# Patient Record
Sex: Female | Born: 1955 | Race: Black or African American | Hispanic: No | Marital: Single | Smoking: Never smoker
Health system: Southern US, Community
[De-identification: ages and names within clinical notes are randomized; demographics above are authoritative.]

## PROBLEM LIST (undated history)

## (undated) DIAGNOSIS — N183 Chronic kidney disease, stage 3 unspecified: Secondary | ICD-10-CM

## (undated) DIAGNOSIS — K769 Liver disease, unspecified: Secondary | ICD-10-CM

## (undated) DIAGNOSIS — E785 Hyperlipidemia, unspecified: Secondary | ICD-10-CM

## (undated) DIAGNOSIS — K859 Acute pancreatitis without necrosis or infection, unspecified: Secondary | ICD-10-CM

## (undated) DIAGNOSIS — E119 Type 2 diabetes mellitus without complications: Secondary | ICD-10-CM

## (undated) DIAGNOSIS — I10 Essential (primary) hypertension: Secondary | ICD-10-CM

## (undated) HISTORY — DX: Hyperlipidemia, unspecified: E78.5

## (undated) HISTORY — PX: PARTIAL HYSTERECTOMY: SHX80

## (undated) HISTORY — DX: Acute pancreatitis without necrosis or infection, unspecified: K85.90

## (undated) HISTORY — DX: Liver disease, unspecified: K76.9

## (undated) HISTORY — DX: Chronic kidney disease, stage 3 (moderate): N18.3

## (undated) HISTORY — DX: Chronic kidney disease, stage 3 unspecified: N18.30

## (undated) HISTORY — DX: Essential (primary) hypertension: I10

## (undated) HISTORY — DX: Type 2 diabetes mellitus without complications: E11.9

---

## 1999-05-05 ENCOUNTER — Emergency Department (HOSPITAL_COMMUNITY): Admission: EM | Admit: 1999-05-05 | Discharge: 1999-05-05 | Payer: Self-pay | Admitting: Emergency Medicine

## 1999-10-14 ENCOUNTER — Encounter: Payer: Self-pay | Admitting: Gastroenterology

## 1999-10-14 ENCOUNTER — Ambulatory Visit (HOSPITAL_COMMUNITY): Admission: RE | Admit: 1999-10-14 | Discharge: 1999-10-14 | Payer: Self-pay | Admitting: Gastroenterology

## 1999-10-31 ENCOUNTER — Other Ambulatory Visit: Admission: RE | Admit: 1999-10-31 | Discharge: 1999-10-31 | Payer: Self-pay | Admitting: Gastroenterology

## 2000-01-25 ENCOUNTER — Other Ambulatory Visit: Admission: RE | Admit: 2000-01-25 | Discharge: 2000-01-25 | Payer: Self-pay | Admitting: Obstetrics and Gynecology

## 2004-06-22 ENCOUNTER — Ambulatory Visit: Payer: Self-pay | Admitting: Internal Medicine

## 2004-07-19 ENCOUNTER — Other Ambulatory Visit: Admission: RE | Admit: 2004-07-19 | Discharge: 2004-07-19 | Payer: Self-pay | Admitting: Obstetrics and Gynecology

## 2005-09-12 ENCOUNTER — Encounter: Admission: RE | Admit: 2005-09-12 | Discharge: 2005-09-12 | Payer: Self-pay | Admitting: Family Medicine

## 2010-08-24 ENCOUNTER — Emergency Department (HOSPITAL_COMMUNITY)
Admission: EM | Admit: 2010-08-24 | Discharge: 2010-08-24 | Disposition: A | Discharge status: Home / Self Care | Payer: BC Managed Care – PPO | Attending: Emergency Medicine | Admitting: Emergency Medicine

## 2010-08-24 ENCOUNTER — Emergency Department (HOSPITAL_COMMUNITY): Payer: BC Managed Care – PPO

## 2010-08-24 DIAGNOSIS — T783XXA Angioneurotic edema, initial encounter: Secondary | ICD-10-CM | POA: Insufficient documentation

## 2010-08-24 DIAGNOSIS — R51 Headache: Secondary | ICD-10-CM | POA: Insufficient documentation

## 2010-08-24 DIAGNOSIS — I10 Essential (primary) hypertension: Secondary | ICD-10-CM | POA: Insufficient documentation

## 2010-08-24 DIAGNOSIS — X58XXXA Exposure to other specified factors, initial encounter: Secondary | ICD-10-CM | POA: Insufficient documentation

## 2010-08-24 DIAGNOSIS — D649 Anemia, unspecified: Secondary | ICD-10-CM | POA: Insufficient documentation

## 2010-08-24 LAB — CBC
MCH: 27.3 pg (ref 26.0–34.0)
Platelets: 402 10*3/uL — ABNORMAL HIGH (ref 150–400)
RBC: 3.96 MIL/uL (ref 3.87–5.11)
WBC: 10.3 10*3/uL (ref 4.0–10.5)

## 2010-08-24 LAB — DIFFERENTIAL
Basophils Absolute: 0 10*3/uL (ref 0.0–0.1)
Basophils Relative: 0 % (ref 0–1)
Eosinophils Absolute: 0 10*3/uL (ref 0.0–0.7)
Eosinophils Relative: 0 % (ref 0–5)
Lymphocytes Relative: 18 % (ref 12–46)
Lymphs Abs: 1.8 10*3/uL (ref 0.7–4.0)
Monocytes Absolute: 0.2 10*3/uL (ref 0.1–1.0)
Monocytes Relative: 2 % — ABNORMAL LOW (ref 3–12)
Neutro Abs: 8.2 10*3/uL — ABNORMAL HIGH (ref 1.7–7.7)
Neutrophils Relative %: 80 % — ABNORMAL HIGH (ref 43–77)

## 2010-08-24 LAB — BASIC METABOLIC PANEL
Chloride: 107 mEq/L (ref 96–112)
Creatinine, Ser: 1.37 mg/dL — ABNORMAL HIGH (ref 0.4–1.2)
GFR calc Af Amer: 48 mL/min — ABNORMAL LOW (ref >60)
Potassium: 3.9 mEq/L (ref 3.5–5.1)
Sodium: 140 mEq/L (ref 135–145)

## 2011-04-03 ENCOUNTER — Emergency Department (HOSPITAL_COMMUNITY)
Admission: EM | Admit: 2011-04-03 | Discharge: 2011-04-03 | Disposition: A | Discharge status: Home / Self Care | Payer: BC Managed Care – PPO | Attending: Emergency Medicine | Admitting: Emergency Medicine

## 2011-04-03 ENCOUNTER — Emergency Department (HOSPITAL_COMMUNITY): Payer: BC Managed Care – PPO

## 2011-04-03 DIAGNOSIS — I10 Essential (primary) hypertension: Secondary | ICD-10-CM | POA: Insufficient documentation

## 2011-04-03 DIAGNOSIS — E860 Dehydration: Secondary | ICD-10-CM | POA: Insufficient documentation

## 2011-04-03 DIAGNOSIS — R109 Unspecified abdominal pain: Secondary | ICD-10-CM | POA: Insufficient documentation

## 2011-04-03 DIAGNOSIS — R7309 Other abnormal glucose: Secondary | ICD-10-CM | POA: Insufficient documentation

## 2011-04-03 DIAGNOSIS — R197 Diarrhea, unspecified: Secondary | ICD-10-CM | POA: Insufficient documentation

## 2011-04-03 DIAGNOSIS — K7689 Other specified diseases of liver: Secondary | ICD-10-CM | POA: Insufficient documentation

## 2011-04-03 DIAGNOSIS — M069 Rheumatoid arthritis, unspecified: Secondary | ICD-10-CM | POA: Insufficient documentation

## 2011-04-03 LAB — DIFFERENTIAL
Basophils Relative: 0 % (ref 0–1)
Lymphocytes Relative: 24 % (ref 12–46)
Monocytes Relative: 6 % (ref 3–12)
Neutro Abs: 5.1 10*3/uL (ref 1.7–7.7)
Neutrophils Relative %: 69 % (ref 43–77)

## 2011-04-03 LAB — CBC
Hemoglobin: 11.7 g/dL — ABNORMAL LOW (ref 12.0–15.0)
MCH: 26.6 pg (ref 26.0–34.0)
RBC: 4.4 MIL/uL (ref 3.87–5.11)

## 2011-04-03 LAB — COMPREHENSIVE METABOLIC PANEL
ALT: 51 U/L — ABNORMAL HIGH (ref 0–35)
Alkaline Phosphatase: 120 U/L — ABNORMAL HIGH (ref 39–117)
CO2: 28 mEq/L (ref 19–32)
Calcium: 9.1 mg/dL (ref 8.4–10.5)
GFR calc Af Amer: >60 mL/min (ref >60)
GFR calc non Af Amer: 60 mL/min — ABNORMAL LOW (ref >60)
Glucose, Bld: 290 mg/dL — ABNORMAL HIGH (ref 70–99)
Potassium: 4 mEq/L (ref 3.5–5.1)
Sodium: 134 mEq/L — ABNORMAL LOW (ref 135–145)

## 2011-04-03 LAB — URINALYSIS, ROUTINE W REFLEX MICROSCOPIC
Bilirubin Urine: NEGATIVE
Ketones, ur: NEGATIVE mg/dL
Protein, ur: NEGATIVE mg/dL
Urobilinogen, UA: 0.2 mg/dL (ref 0.0–1.0)

## 2011-04-03 LAB — GLUCOSE, CAPILLARY

## 2011-04-03 MED ORDER — IOHEXOL 300 MG/ML  SOLN
80.0000 mL | Freq: Once | INTRAMUSCULAR | Status: AC | PRN
  Administered 2011-04-03: 80 mL via INTRAVENOUS

## 2014-01-22 ENCOUNTER — Encounter: Payer: Self-pay | Admitting: Internal Medicine

## 2014-03-26 ENCOUNTER — Other Ambulatory Visit: Payer: Self-pay

## 2014-03-31 ENCOUNTER — Ambulatory Visit: Payer: BC Managed Care – PPO | Admitting: Internal Medicine

## 2014-05-19 ENCOUNTER — Ambulatory Visit: Payer: BC Managed Care – PPO | Admitting: Internal Medicine

## 2014-06-19 ENCOUNTER — Ambulatory Visit: Payer: BC Managed Care – PPO | Admitting: Internal Medicine

## 2015-08-17 ENCOUNTER — Ambulatory Visit: Payer: BC Managed Care – PPO | Admitting: Dietician

## 2015-12-16 ENCOUNTER — Emergency Department (HOSPITAL_COMMUNITY)
Admission: EM | Admit: 2015-12-16 | Discharge: 2015-12-16 | Disposition: A | Discharge status: Home / Self Care | Payer: Worker's Compensation | Attending: Emergency Medicine | Admitting: Emergency Medicine

## 2015-12-16 ENCOUNTER — Emergency Department (HOSPITAL_COMMUNITY): Payer: Worker's Compensation

## 2015-12-16 ENCOUNTER — Encounter (HOSPITAL_COMMUNITY): Payer: Self-pay | Admitting: Nurse Practitioner

## 2015-12-16 DIAGNOSIS — W1830XA Fall on same level, unspecified, initial encounter: Secondary | ICD-10-CM | POA: Insufficient documentation

## 2015-12-16 DIAGNOSIS — Y939 Activity, unspecified: Secondary | ICD-10-CM | POA: Insufficient documentation

## 2015-12-16 DIAGNOSIS — E1122 Type 2 diabetes mellitus with diabetic chronic kidney disease: Secondary | ICD-10-CM | POA: Diagnosis not present

## 2015-12-16 DIAGNOSIS — S86002A Unspecified injury of left Achilles tendon, initial encounter: Secondary | ICD-10-CM | POA: Diagnosis not present

## 2015-12-16 DIAGNOSIS — Z79891 Long term (current) use of opiate analgesic: Secondary | ICD-10-CM | POA: Diagnosis not present

## 2015-12-16 DIAGNOSIS — I129 Hypertensive chronic kidney disease with stage 1 through stage 4 chronic kidney disease, or unspecified chronic kidney disease: Secondary | ICD-10-CM | POA: Diagnosis not present

## 2015-12-16 DIAGNOSIS — Z794 Long term (current) use of insulin: Secondary | ICD-10-CM | POA: Insufficient documentation

## 2015-12-16 DIAGNOSIS — Y999 Unspecified external cause status: Secondary | ICD-10-CM | POA: Diagnosis not present

## 2015-12-16 DIAGNOSIS — N183 Chronic kidney disease, stage 3 (moderate): Secondary | ICD-10-CM | POA: Diagnosis not present

## 2015-12-16 DIAGNOSIS — S99912A Unspecified injury of left ankle, initial encounter: Secondary | ICD-10-CM | POA: Diagnosis present

## 2015-12-16 DIAGNOSIS — Z79899 Other long term (current) drug therapy: Secondary | ICD-10-CM | POA: Diagnosis not present

## 2015-12-16 DIAGNOSIS — M25572 Pain in left ankle and joints of left foot: Secondary | ICD-10-CM

## 2015-12-16 DIAGNOSIS — Y929 Unspecified place or not applicable: Secondary | ICD-10-CM | POA: Diagnosis not present

## 2015-12-16 MED ORDER — OXYCODONE-ACETAMINOPHEN 5-325 MG PO TABS: 1.0000 | ORAL_TABLET | ORAL | Status: DC | PRN

## 2015-12-16 NOTE — ED Provider Notes (Signed)
CSN: [id number]     Arrival date & time 12/16/15  1501 History  By signing my name below, I, Soijett Blue, attest that this documentation has been prepared under the direction and in the presence of Sharilyn SitesLisa Juliya Magill, PA-C Electronically Signed: Soijett Blue, ED Scribe. 12/16/2015. 3:34 PM.   Chief Complaint  Patient presents with  . Ankle Pain    Left ankle      The history is provided by the patient. No language interpreter was used.    Jillian Montgomery is a 60 y.o. female with a medical hx of HTN, CKD, who presents to the Emergency Department complaining of left ankle pain onset PTA. Pt notes that she turned abruptly to ambulate and she fell onto her left side.  No head injury or LOC. Pt is having associated symptoms of throbbing left ankle pain, worse along posterior aspect. She notes that she has not tried any medications for the relief of her symptoms. No numbness/weakness of left leg.  Has not been able to ambulate since fall.  She denies color change, wound, rash, left foot swelling, and any other symptoms.   Past Medical History  Diagnosis Date  . Type II or unspecified type diabetes mellitus without mention of complication, not stated as uncontrolled   . Other and unspecified hyperlipidemia   . Unspecified essential hypertension   . Chronic kidney disease, stage III (moderate)    No past surgical history on file. No family history on file. Social History  Substance Use Topics  . Smoking status: Not on file  . Smokeless tobacco: Not on file  . Alcohol Use: Not on file   OB History    No data available     Review of Systems  Musculoskeletal: Positive for arthralgias. Negative for joint swelling.  Skin: Negative for color change, rash and wound.  All other systems reviewed and are negative.     Allergies  Review of patient's allergies indicates not on file.  Home Medications   Prior to Admission medications   Medication Sig Start Date End Date Taking? Authorizing  Provider  atenolol-chlorthalidone (TENORETIC) 50-25 MG per tablet 1 tablet    Historical Provider, MD  Empagliflozin (JARDIANCE) 25 MG TABS Take by mouth. 03/26/14   Rachael Feeaniel P Jacobs, MD  insulin detemir (LEVEMIR) 100 UNIT/ML injection Inject into the skin at bedtime. 03/26/14   Rachael Feeaniel P Jacobs, MD  traMADol (ULTRAM) 50 MG tablet Take 1 tablet (50 mg total) by mouth every 6 (six) hours as needed. 03/26/14   Rachael Feeaniel P Jacobs, MD   BP 124/72 mmHg  Pulse 71  Temp(Src) 98.3 F (36.8 C) (Oral)  Resp 18  SpO2 100%   Physical Exam  Constitutional: She is oriented to person, place, and time. She appears well-developed and well-nourished.  HENT:  Head: Normocephalic and atraumatic.  Mouth/Throat: Oropharynx is clear and moist.  Eyes: Conjunctivae and EOM are normal. Pupils are equal, round, and reactive to light.  Neck: Normal range of motion.  Cardiovascular: Normal rate, regular rhythm and normal heart sounds.   Pulmonary/Chest: Effort normal and breath sounds normal.  Abdominal: Soft. Bowel sounds are normal.  Musculoskeletal: Normal range of motion.       Left ankle: She exhibits swelling. Achilles tendon exhibits pain, defect and abnormal Thompson's test results.  Left ankle with severe tenderness and mild swelling over the achilles tendon, defect and laxity noted in central portion of tendon; pain with flexion of ankle, no pain with extension; + Thompson's test; DP  pulse intact; moving all toes appropriately  Neurological: She is alert and oriented to person, place, and time.  Skin: Skin is warm and dry.  Psychiatric: She has a normal mood and affect.  Nursing note and vitals reviewed.   ED Course  .Splint Application Date/Time: 12/16/2015 4:05 PM Performed by: Lou Cal Authorized by: Garlon Hatchet Consent: Verbal consent obtained. Risks and benefits: risks, benefits and alternatives were discussed Consent given by: patient Patient understanding: patient states understanding of  the procedure being performed Required items: required blood products, implants, devices, and special equipment available Patient identity confirmed: verbally with patient Location details: left ankle Splint type: short leg Supplies used: cotton padding,  elastic bandage and Ortho-Glass Post-procedure: The splinted body part was neurovascularly unchanged following the procedure. Patient tolerance: Patient tolerated the procedure well with no immediate complications Comments: Posterior splint in equinas position.   (including critical care time) DIAGNOSTIC STUDIES: Oxygen Saturation is 100% on RA, nl by my interpretation.    COORDINATION OF CARE: 3:23 PM Discussed treatment plan with pt at bedside which includes left ankle xray and pt agreed to plan.    Labs Review Labs Reviewed - No data to display  Imaging Review Dg Ankle Complete Left  12/16/2015  CLINICAL DATA:  Acute left ankle pain after twisting injury. Initial encounter. EXAM: LEFT ANKLE COMPLETE - 3+ VIEW COMPARISON:  None. FINDINGS: There is no evidence of fracture, dislocation, or joint effusion. There is no evidence of arthropathy or other focal bone abnormality. Soft tissues are unremarkable. IMPRESSION: Normal left ankle. Electronically Signed   By: Lupita Raider, M.D.   On: 12/16/2015 15:48   I have personally reviewed and evaluated these images as part of my medical decision-making.   EKG Interpretation None      MDM   Final diagnoses:  Left ankle pain  Achilles tendon injury, left, initial encounter   59 year old female here with posterior left ankle pain after a fall. No head injury loss of consciousness.  Patient has tenderness, swelling, and a defect noted along her Achilles tendon. She has a positive Thompson's test. Foot is neurovascularly intact. X-rays negative for acute bony findings. Strong suspicion for Achilles tendon injury/rupture. Patient to be placed in short-leg splint in equinas position.   Orthopedic follow-up given-- advised to call office today to scheduled FU appt.  Rx percocet for pain.  Discussed plan with patient, he/she acknowledged understanding and agreed with plan of care.  Return precautions given for new or worsening symptoms.  I personally performed the services described in this documentation, which was scribed in my presence. The recorded information has been reviewed and is accurate.   Garlon Hatchet, PA-C 12/16/15 1619  Lavera Guise, MD 12/18/15 [phone]

## 2015-12-16 NOTE — ED Notes (Signed)
Pt presents with severe pain 8/10 to her left ankle, reportedly had a "jerking" sudden movement that led hyperflexing of her ankle. No circulatory compromise noted on, mild swelling, pt unable to bare weight on affected leg.

## 2015-12-16 NOTE — Discharge Instructions (Signed)
Take the prescribed medication as directed.  Use caution, can make you sleepy/drowsy. Follow-up with orthopedics.  Call Dr. Nolon Nationsalldorf's office to make appt. Return to the ED for new or worsening symptoms.

## 2016-09-21 ENCOUNTER — Other Ambulatory Visit: Payer: Self-pay | Admitting: Nurse Practitioner

## 2016-09-21 DIAGNOSIS — Z1231 Encounter for screening mammogram for malignant neoplasm of breast: Secondary | ICD-10-CM

## 2016-10-09 ENCOUNTER — Ambulatory Visit
Admission: RE | Admit: 2016-10-09 | Discharge: 2016-10-09 | Disposition: A | Discharge status: Home / Self Care | Payer: BC Managed Care – PPO | Source: Ambulatory Visit | Attending: Nurse Practitioner | Admitting: Nurse Practitioner

## 2016-10-09 DIAGNOSIS — Z1231 Encounter for screening mammogram for malignant neoplasm of breast: Secondary | ICD-10-CM

## 2017-11-13 ENCOUNTER — Encounter: Payer: Self-pay | Admitting: Podiatry

## 2017-11-13 ENCOUNTER — Ambulatory Visit: Payer: BC Managed Care – PPO | Admitting: Podiatry

## 2017-11-13 VITALS — BP 149/71 | HR 73

## 2017-11-13 DIAGNOSIS — B070 Plantar wart: Secondary | ICD-10-CM

## 2017-11-13 MED ORDER — FLUOROURACIL 5 % EX CREA: TOPICAL_CREAM | Freq: Two times a day (BID) | CUTANEOUS | 1 refills | Status: DC

## 2017-11-13 NOTE — Progress Notes (Signed)
Subjective:  Patient ID: Jillian Montgomery, female    DOB: [birth year],  MRN: [id number] HPI Chief Complaint  Patient presents with  . Plantar Warts    right foot arch area; pt stated, "spot on foot started out like a little knot, became a little uncomfortable when walking; started forming about 1 yr ago"    62 y.o. female presents with the above complaint.   Review of systems: Denies fever chills nausea vomiting muscle aches and pains calf pain back pain chest pain shortness of breath and headache.  Past Medical History:  Diagnosis Date  . Chronic kidney disease, stage III (moderate) (HCC)   . Other and unspecified hyperlipidemia   . Type II or unspecified type diabetes mellitus without mention of complication, not stated as uncontrolled   . Unspecified essential hypertension    History reviewed. No pertinent surgical history.  Current Outpatient Medications:  .  atenolol-chlorthalidone (TENORETIC) 50-25 MG per tablet, 1 tablet, Disp: , Rfl:  .  atorvastatin (LIPITOR) 20 MG tablet, Take 20 mg by mouth daily., Disp: , Rfl: 0 .  Empagliflozin (JARDIANCE) 25 MG TABS, Take by mouth., Disp: 30 tablet, Rfl:  .  insulin detemir (LEVEMIR) 100 UNIT/ML injection, Inject into the skin at bedtime., Disp: 10 mL, Rfl: 11 .  metroNIDAZOLE (FLAGYL) 250 MG tablet, TAKE 1 TABLET 3 TIMES A DAY UNTIL ALL ARE TAKEN, Disp: , Rfl: 0 .  NOVOLOG FLEXPEN 100 UNIT/ML FlexPen, INJECT 5-10 UNITS THREE TIMES A DAY SUBCUTANEOUS 30 DAYS, Disp: , Rfl: 1 .  fluorouracil (EFUDEX) 5 % cream, Apply topically 2 (two) times daily., Disp: 40 g, Rfl: 1 .  oxyCODONE-acetaminophen (PERCOCET/ROXICET) 5-325 MG tablet, Take 1 tablet by mouth every 4 (four) hours as needed. (Patient not taking: Reported on 11/13/2017), Disp: 20 tablet, Rfl: 0 .  traMADol (ULTRAM) 50 MG tablet, Take 1 tablet (50 mg total) by mouth every 6 (six) hours as needed., Disp: 30 tablet, Rfl: 0  Allergies  Allergen Reactions  . Lisinopril Swelling    Pt  stated, "lips swell up"   Review of Systems Objective:   Vitals:   11/13/17 0851  BP: (!) 149/71  Pulse: 73    General: Well developed, nourished, in no acute distress, alert and oriented x3   Dermatological: Skin is warm, dry and supple bilateral. Nails x 10 are well maintained; remaining integument appears unremarkable at this time. There are no open sores, no preulcerative lesions, no rash or signs of infection present.  Lesion centrally located medial longitudinal arch along the medial band of the plantar fascia appears to be an intradermal lesion rather than a fibroma.  Possibly a dermatofibroma secondary to trauma or possibly even wart there is a small area on the lesion that appears to be a new verruca with thrombosed capillaries and second lines of discharge from that the lesion.  Vascular: Dorsalis Pedis artery and Posterior Tibial artery pedal pulses are 2/4 bilateral with immedate capillary fill time. Pedal hair growth present. No varicosities and no lower extremity edema present bilateral.   Neruologic: Grossly intact via light touch bilateral. Vibratory intact via tuning fork bilateral. Protective threshold with Semmes Wienstein monofilament intact to all pedal sites bilateral. Patellar and Achilles deep tendon reflexes 2+ bilateral. No Babinski or clonus noted bilateral.   Musculoskeletal: No gross boney pedal deformities bilateral. No pain, crepitus, or limitation noted with foot and ankle range of motion bilateral. Muscular strength 5/5 in all groups tested bilateral.  Gait: Unassisted, Nonantalgic.  Radiographs:  None taken  Assessment & Plan:   Assessment: Probable wart or dermatofibroma.  Plan: Placed Cantharone under occlusion today after debridement of the lesion.     Rhylee Nunn T. Chillum, [place]

## 2017-12-25 ENCOUNTER — Ambulatory Visit: Payer: BC Managed Care – PPO | Admitting: Podiatry

## 2020-10-07 DIAGNOSIS — I10 Essential (primary) hypertension: Secondary | ICD-10-CM | POA: Diagnosis not present

## 2020-10-07 DIAGNOSIS — N183 Chronic kidney disease, stage 3 unspecified: Secondary | ICD-10-CM | POA: Diagnosis not present

## 2020-10-07 DIAGNOSIS — Z1211 Encounter for screening for malignant neoplasm of colon: Secondary | ICD-10-CM | POA: Diagnosis not present

## 2020-10-07 DIAGNOSIS — E1165 Type 2 diabetes mellitus with hyperglycemia: Secondary | ICD-10-CM | POA: Diagnosis not present

## 2020-10-07 DIAGNOSIS — Z23 Encounter for immunization: Secondary | ICD-10-CM | POA: Diagnosis not present

## 2020-10-07 DIAGNOSIS — E785 Hyperlipidemia, unspecified: Secondary | ICD-10-CM | POA: Diagnosis not present

## 2020-10-07 DIAGNOSIS — Z0000 Encounter for general adult medical examination without abnormal findings: Secondary | ICD-10-CM | POA: Diagnosis not present

## 2020-10-07 DIAGNOSIS — E2839 Other primary ovarian failure: Secondary | ICD-10-CM | POA: Diagnosis not present

## 2020-10-07 DIAGNOSIS — Z1389 Encounter for screening for other disorder: Secondary | ICD-10-CM | POA: Diagnosis not present

## 2020-10-08 ENCOUNTER — Other Ambulatory Visit: Payer: Self-pay | Admitting: Family Medicine

## 2020-10-08 DIAGNOSIS — Z1231 Encounter for screening mammogram for malignant neoplasm of breast: Secondary | ICD-10-CM

## 2020-10-08 DIAGNOSIS — E2839 Other primary ovarian failure: Secondary | ICD-10-CM

## 2021-02-04 DIAGNOSIS — E1169 Type 2 diabetes mellitus with other specified complication: Secondary | ICD-10-CM | POA: Diagnosis not present

## 2021-02-04 DIAGNOSIS — Z794 Long term (current) use of insulin: Secondary | ICD-10-CM | POA: Diagnosis not present

## 2021-02-04 DIAGNOSIS — E785 Hyperlipidemia, unspecified: Secondary | ICD-10-CM | POA: Diagnosis not present

## 2021-02-04 DIAGNOSIS — I10 Essential (primary) hypertension: Secondary | ICD-10-CM | POA: Diagnosis not present

## 2021-02-04 DIAGNOSIS — E1129 Type 2 diabetes mellitus with other diabetic kidney complication: Secondary | ICD-10-CM | POA: Diagnosis not present

## 2021-02-04 DIAGNOSIS — E1165 Type 2 diabetes mellitus with hyperglycemia: Secondary | ICD-10-CM | POA: Diagnosis not present

## 2021-02-04 DIAGNOSIS — R809 Proteinuria, unspecified: Secondary | ICD-10-CM | POA: Diagnosis not present

## 2021-02-13 DIAGNOSIS — E119 Type 2 diabetes mellitus without complications: Secondary | ICD-10-CM

## 2021-02-13 DIAGNOSIS — E1165 Type 2 diabetes mellitus with hyperglycemia: Secondary | ICD-10-CM | POA: Diagnosis present

## 2021-04-13 DIAGNOSIS — H5213 Myopia, bilateral: Secondary | ICD-10-CM | POA: Diagnosis not present

## 2021-04-14 ENCOUNTER — Ambulatory Visit
Admission: RE | Admit: 2021-04-14 | Discharge: 2021-04-14 | Disposition: A | Discharge status: Home / Self Care | Payer: Medicare HMO | Source: Ambulatory Visit | Attending: Family Medicine | Admitting: Family Medicine

## 2021-04-14 ENCOUNTER — Other Ambulatory Visit: Payer: Self-pay

## 2021-04-14 DIAGNOSIS — Z1231 Encounter for screening mammogram for malignant neoplasm of breast: Secondary | ICD-10-CM

## 2021-04-14 DIAGNOSIS — E2839 Other primary ovarian failure: Secondary | ICD-10-CM

## 2021-04-14 DIAGNOSIS — M810 Age-related osteoporosis without current pathological fracture: Secondary | ICD-10-CM | POA: Diagnosis not present

## 2021-04-14 DIAGNOSIS — Z780 Asymptomatic menopausal state: Secondary | ICD-10-CM | POA: Diagnosis not present

## 2021-04-14 DIAGNOSIS — M85851 Other specified disorders of bone density and structure, right thigh: Secondary | ICD-10-CM | POA: Diagnosis not present

## 2021-08-09 DIAGNOSIS — H524 Presbyopia: Secondary | ICD-10-CM | POA: Diagnosis not present

## 2021-08-09 DIAGNOSIS — H52222 Regular astigmatism, left eye: Secondary | ICD-10-CM | POA: Diagnosis not present

## 2021-08-31 DIAGNOSIS — E785 Hyperlipidemia, unspecified: Secondary | ICD-10-CM | POA: Diagnosis not present

## 2021-08-31 DIAGNOSIS — N183 Chronic kidney disease, stage 3 unspecified: Secondary | ICD-10-CM | POA: Diagnosis not present

## 2021-08-31 DIAGNOSIS — I10 Essential (primary) hypertension: Secondary | ICD-10-CM | POA: Diagnosis not present

## 2021-08-31 DIAGNOSIS — E1165 Type 2 diabetes mellitus with hyperglycemia: Secondary | ICD-10-CM | POA: Diagnosis not present

## 2021-08-31 DIAGNOSIS — M542 Cervicalgia: Secondary | ICD-10-CM | POA: Diagnosis not present

## 2021-09-13 DIAGNOSIS — M2560 Stiffness of unspecified joint, not elsewhere classified: Secondary | ICD-10-CM | POA: Diagnosis not present

## 2021-09-13 DIAGNOSIS — M542 Cervicalgia: Secondary | ICD-10-CM | POA: Diagnosis not present

## 2021-09-29 DIAGNOSIS — M2560 Stiffness of unspecified joint, not elsewhere classified: Secondary | ICD-10-CM | POA: Diagnosis not present

## 2021-09-29 DIAGNOSIS — M542 Cervicalgia: Secondary | ICD-10-CM | POA: Diagnosis not present

## 2021-10-11 DIAGNOSIS — M2560 Stiffness of unspecified joint, not elsewhere classified: Secondary | ICD-10-CM | POA: Diagnosis not present

## 2021-10-11 DIAGNOSIS — M542 Cervicalgia: Secondary | ICD-10-CM | POA: Diagnosis not present

## 2021-10-13 DIAGNOSIS — M2560 Stiffness of unspecified joint, not elsewhere classified: Secondary | ICD-10-CM | POA: Diagnosis not present

## 2021-10-13 DIAGNOSIS — M542 Cervicalgia: Secondary | ICD-10-CM | POA: Diagnosis not present

## 2021-10-17 DIAGNOSIS — M542 Cervicalgia: Secondary | ICD-10-CM | POA: Diagnosis not present

## 2021-10-17 DIAGNOSIS — M2560 Stiffness of unspecified joint, not elsewhere classified: Secondary | ICD-10-CM | POA: Diagnosis not present

## 2021-10-24 DIAGNOSIS — M2560 Stiffness of unspecified joint, not elsewhere classified: Secondary | ICD-10-CM | POA: Diagnosis not present

## 2021-10-24 DIAGNOSIS — M542 Cervicalgia: Secondary | ICD-10-CM | POA: Diagnosis not present

## 2022-03-28 DIAGNOSIS — I10 Essential (primary) hypertension: Secondary | ICD-10-CM | POA: Diagnosis not present

## 2022-03-28 DIAGNOSIS — E785 Hyperlipidemia, unspecified: Secondary | ICD-10-CM | POA: Diagnosis not present

## 2022-03-28 DIAGNOSIS — F439 Reaction to severe stress, unspecified: Secondary | ICD-10-CM | POA: Diagnosis not present

## 2022-03-28 DIAGNOSIS — E1165 Type 2 diabetes mellitus with hyperglycemia: Secondary | ICD-10-CM | POA: Diagnosis not present

## 2022-03-28 DIAGNOSIS — R808 Other proteinuria: Secondary | ICD-10-CM | POA: Diagnosis not present

## 2022-03-28 DIAGNOSIS — N183 Chronic kidney disease, stage 3 unspecified: Secondary | ICD-10-CM | POA: Diagnosis not present

## 2022-04-14 DIAGNOSIS — H5203 Hypermetropia, bilateral: Secondary | ICD-10-CM | POA: Diagnosis not present

## 2022-04-17 DIAGNOSIS — I10 Essential (primary) hypertension: Secondary | ICD-10-CM | POA: Diagnosis not present

## 2022-04-17 DIAGNOSIS — E1129 Type 2 diabetes mellitus with other diabetic kidney complication: Secondary | ICD-10-CM | POA: Diagnosis not present

## 2022-04-17 DIAGNOSIS — E1165 Type 2 diabetes mellitus with hyperglycemia: Secondary | ICD-10-CM | POA: Diagnosis not present

## 2022-04-17 DIAGNOSIS — N183 Chronic kidney disease, stage 3 unspecified: Secondary | ICD-10-CM | POA: Diagnosis not present

## 2022-04-17 DIAGNOSIS — R809 Proteinuria, unspecified: Secondary | ICD-10-CM | POA: Diagnosis not present

## 2022-05-27 IMAGING — MG MM DIGITAL SCREENING BILAT W/ TOMO AND CAD
8 series · 8 of 24 positions shown · non-contrast
Comparison: Previous exam(s).

CLINICAL DATA: Screening.

EXAM:
DIGITAL SCREENING BILATERAL MAMMOGRAM WITH TOMOSYNTHESIS AND CAD
TECHNIQUE: Bilateral screening digital craniocaudal and mediolateral oblique
mammograms were obtained. Bilateral screening digital breast
tomosynthesis was performed. The images were evaluated with
computer-aided detection.

[L CC synth-2D]
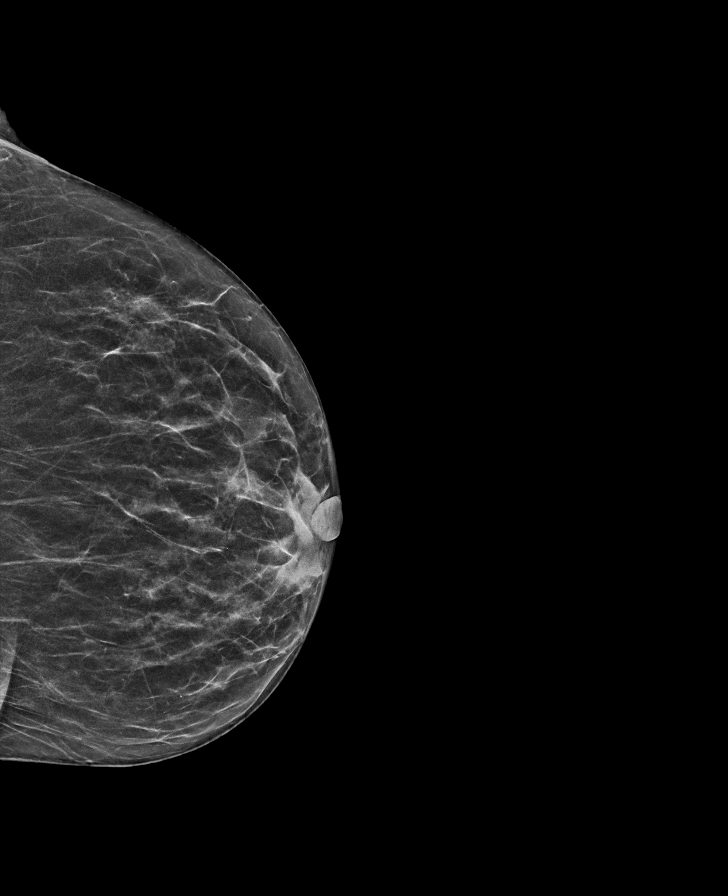

[R MLO synth-2D]
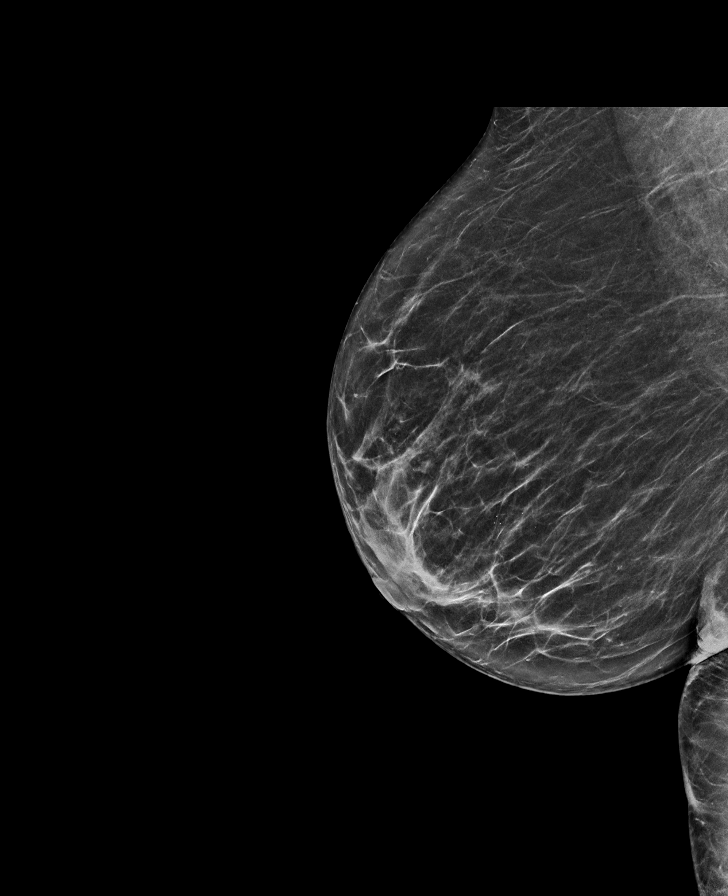

[R CC synth-2D]
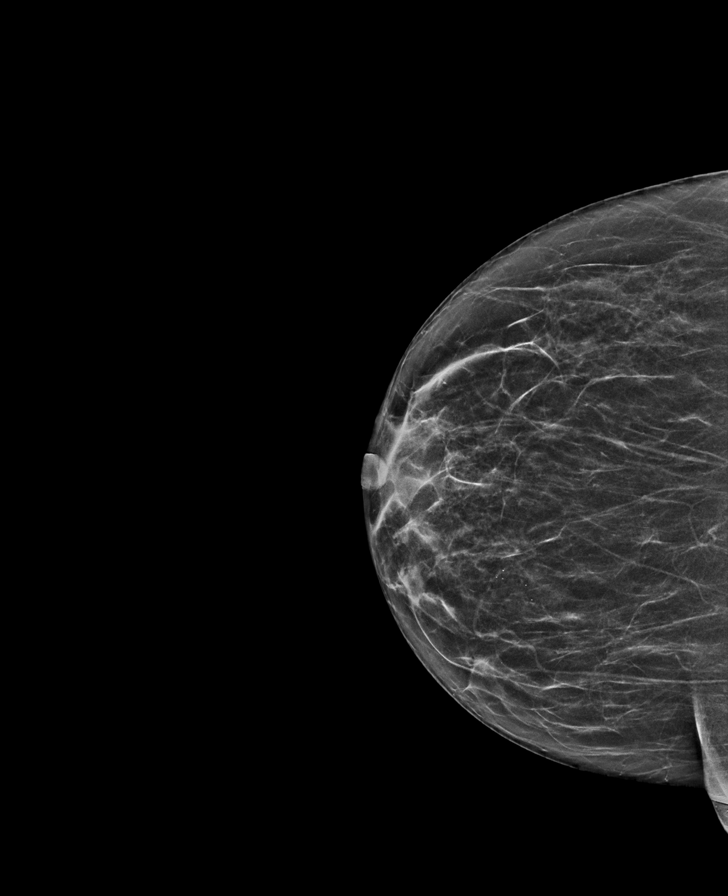

[L MLO synth-2D]
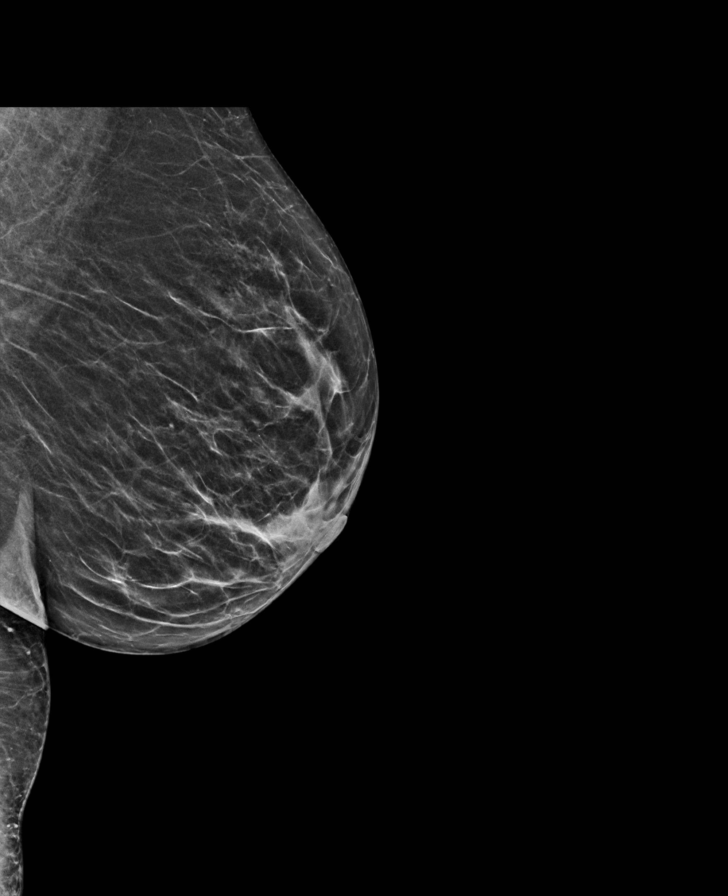

[L MLO tomo · tomo slice 28/55.0]
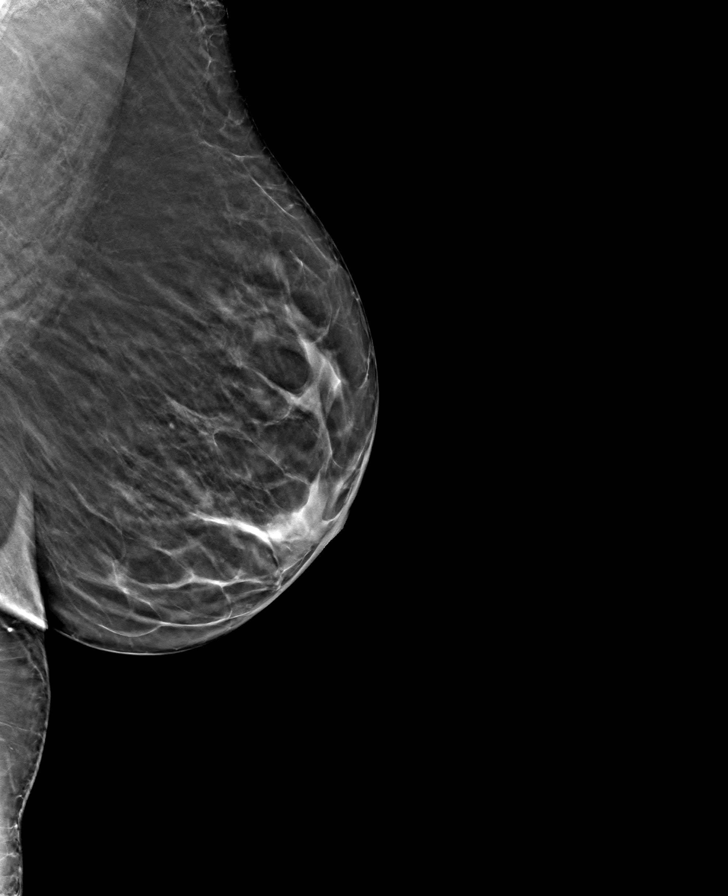

[R CC tomo · tomo slice 27/53.0]
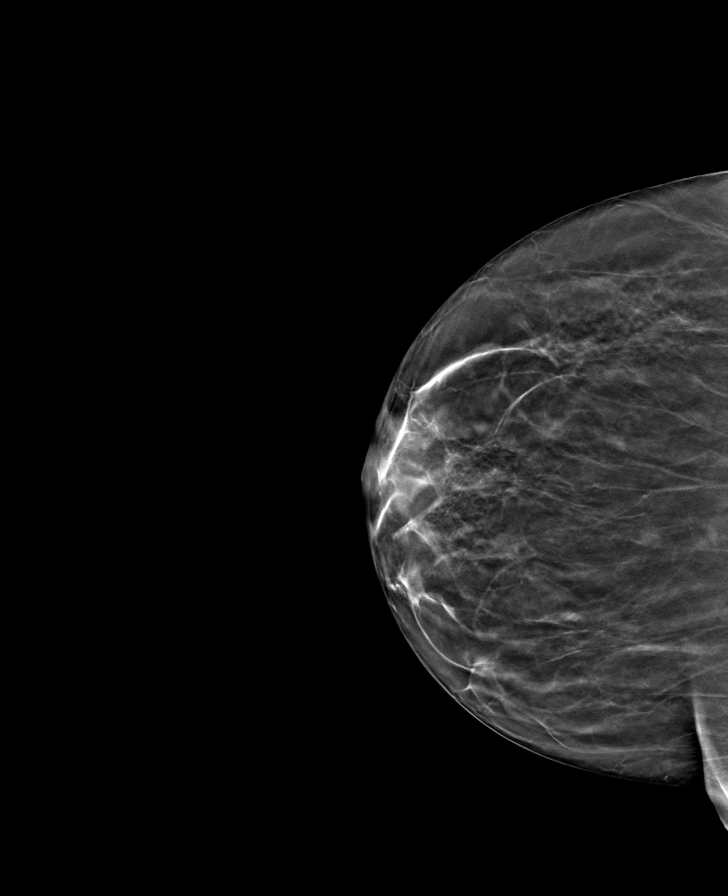

[R MLO tomo · tomo slice 31/61.0]
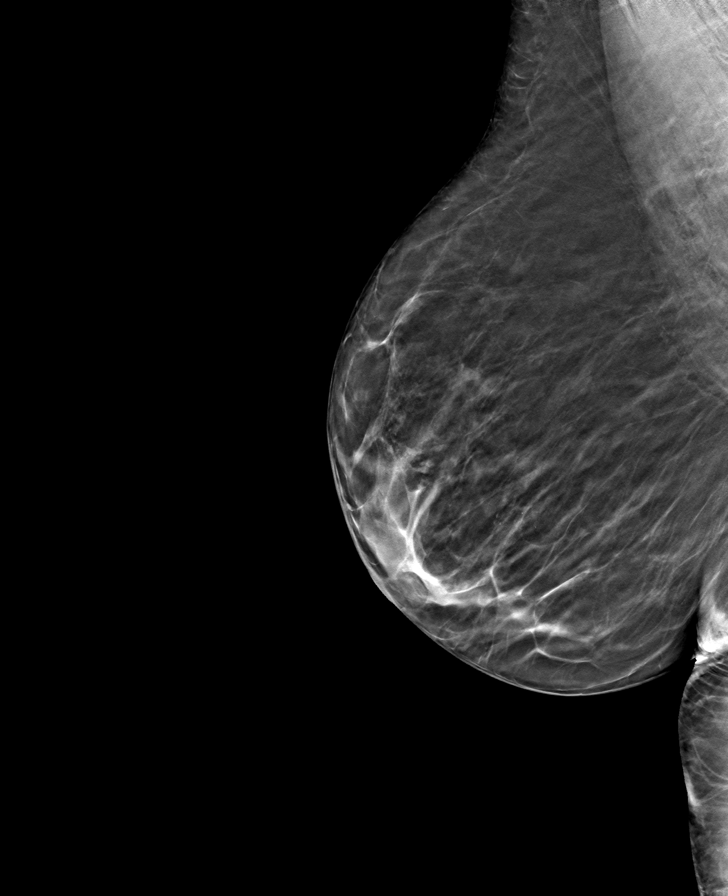

[L CC tomo · tomo slice 26/51.0]
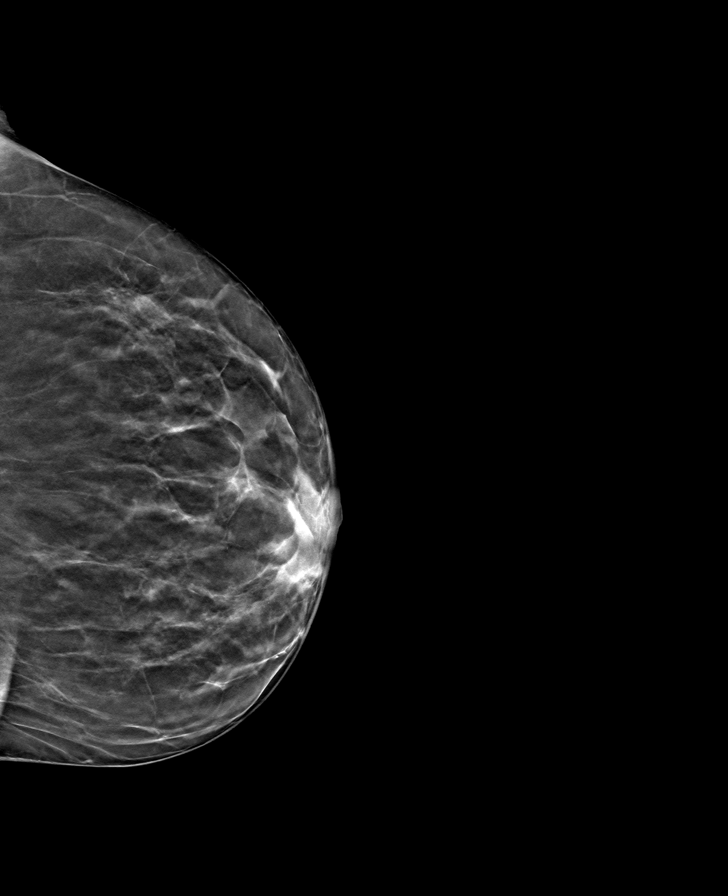

[8 of 24 positions shown; findings below may reference images not displayed]

ACR Breast Density Category b: There are scattered areas of
fibroglandular density.
FINDINGS: There are no findings suspicious for malignancy.
IMPRESSION: No mammographic evidence of malignancy. A result letter of this
screening mammogram will be mailed directly to the patient.

RECOMMENDATION:
Screening mammogram in one year. (Code:51-O-LD2)

BI-RADS CATEGORY  1: Negative.

## 2022-06-21 DIAGNOSIS — B349 Viral infection, unspecified: Secondary | ICD-10-CM | POA: Diagnosis not present

## 2022-12-26 ENCOUNTER — Other Ambulatory Visit: Payer: Self-pay

## 2022-12-26 ENCOUNTER — Inpatient Hospital Stay (HOSPITAL_COMMUNITY)
Admission: EM | Admit: 2022-12-26 | Discharge: 2023-01-04 | DRG: 392 | Disposition: A | Discharge status: Home Health | Payer: Medicare HMO | Attending: Internal Medicine | Admitting: Internal Medicine

## 2022-12-26 ENCOUNTER — Emergency Department (HOSPITAL_COMMUNITY): Payer: Medicare HMO

## 2022-12-26 ENCOUNTER — Encounter (HOSPITAL_COMMUNITY): Payer: Self-pay

## 2022-12-26 DIAGNOSIS — Z803 Family history of malignant neoplasm of breast: Secondary | ICD-10-CM

## 2022-12-26 DIAGNOSIS — Z1611 Resistance to penicillins: Secondary | ICD-10-CM | POA: Diagnosis present

## 2022-12-26 DIAGNOSIS — K5720 Diverticulitis of large intestine with perforation and abscess without bleeding: Secondary | ICD-10-CM | POA: Diagnosis not present

## 2022-12-26 DIAGNOSIS — K632 Fistula of intestine: Secondary | ICD-10-CM | POA: Diagnosis not present

## 2022-12-26 DIAGNOSIS — Z888 Allergy status to other drugs, medicaments and biological substances status: Secondary | ICD-10-CM | POA: Diagnosis not present

## 2022-12-26 DIAGNOSIS — I129 Hypertensive chronic kidney disease with stage 1 through stage 4 chronic kidney disease, or unspecified chronic kidney disease: Secondary | ICD-10-CM | POA: Diagnosis present

## 2022-12-26 DIAGNOSIS — B9620 Unspecified Escherichia coli [E. coli] as the cause of diseases classified elsewhere: Secondary | ICD-10-CM | POA: Diagnosis not present

## 2022-12-26 DIAGNOSIS — R1030 Lower abdominal pain, unspecified: Secondary | ICD-10-CM | POA: Diagnosis present

## 2022-12-26 DIAGNOSIS — N1832 Chronic kidney disease, stage 3b: Secondary | ICD-10-CM | POA: Diagnosis not present

## 2022-12-26 DIAGNOSIS — Z79899 Other long term (current) drug therapy: Secondary | ICD-10-CM | POA: Diagnosis not present

## 2022-12-26 DIAGNOSIS — N183 Chronic kidney disease, stage 3 unspecified: Secondary | ICD-10-CM | POA: Diagnosis present

## 2022-12-26 DIAGNOSIS — I700 Atherosclerosis of aorta: Secondary | ICD-10-CM | POA: Diagnosis not present

## 2022-12-26 DIAGNOSIS — R1084 Generalized abdominal pain: Secondary | ICD-10-CM | POA: Diagnosis not present

## 2022-12-26 DIAGNOSIS — Z90710 Acquired absence of both cervix and uterus: Secondary | ICD-10-CM

## 2022-12-26 DIAGNOSIS — Z794 Long term (current) use of insulin: Secondary | ICD-10-CM

## 2022-12-26 DIAGNOSIS — E1165 Type 2 diabetes mellitus with hyperglycemia: Secondary | ICD-10-CM | POA: Diagnosis not present

## 2022-12-26 DIAGNOSIS — I10 Essential (primary) hypertension: Secondary | ICD-10-CM | POA: Diagnosis not present

## 2022-12-26 DIAGNOSIS — T85628A Displacement of other specified internal prosthetic devices, implants and grafts, initial encounter: Secondary | ICD-10-CM | POA: Diagnosis not present

## 2022-12-26 DIAGNOSIS — Z800 Family history of malignant neoplasm of digestive organs: Secondary | ICD-10-CM | POA: Diagnosis not present

## 2022-12-26 DIAGNOSIS — E876 Hypokalemia: Secondary | ICD-10-CM | POA: Diagnosis not present

## 2022-12-26 DIAGNOSIS — T85698A Other mechanical complication of other specified internal prosthetic devices, implants and grafts, initial encounter: Secondary | ICD-10-CM | POA: Diagnosis not present

## 2022-12-26 DIAGNOSIS — Z7984 Long term (current) use of oral hypoglycemic drugs: Secondary | ICD-10-CM | POA: Diagnosis not present

## 2022-12-26 DIAGNOSIS — E1122 Type 2 diabetes mellitus with diabetic chronic kidney disease: Secondary | ICD-10-CM | POA: Diagnosis present

## 2022-12-26 DIAGNOSIS — E785 Hyperlipidemia, unspecified: Secondary | ICD-10-CM | POA: Diagnosis present

## 2022-12-26 DIAGNOSIS — R1010 Upper abdominal pain, unspecified: Secondary | ICD-10-CM | POA: Diagnosis not present

## 2022-12-26 DIAGNOSIS — K5730 Diverticulosis of large intestine without perforation or abscess without bleeding: Secondary | ICD-10-CM | POA: Diagnosis not present

## 2022-12-26 DIAGNOSIS — Z4803 Encounter for change or removal of drains: Secondary | ICD-10-CM | POA: Diagnosis not present

## 2022-12-26 DIAGNOSIS — R109 Unspecified abdominal pain: Secondary | ICD-10-CM | POA: Diagnosis not present

## 2022-12-26 DIAGNOSIS — K5780 Diverticulitis of intestine, part unspecified, with perforation and abscess without bleeding: Secondary | ICD-10-CM | POA: Diagnosis not present

## 2022-12-26 DIAGNOSIS — N1831 Chronic kidney disease, stage 3a: Secondary | ICD-10-CM | POA: Diagnosis not present

## 2022-12-26 DIAGNOSIS — K652 Spontaneous bacterial peritonitis: Secondary | ICD-10-CM | POA: Diagnosis not present

## 2022-12-26 DIAGNOSIS — K651 Peritoneal abscess: Secondary | ICD-10-CM | POA: Diagnosis not present

## 2022-12-26 DIAGNOSIS — R739 Hyperglycemia, unspecified: Secondary | ICD-10-CM | POA: Diagnosis not present

## 2022-12-26 DIAGNOSIS — E119 Type 2 diabetes mellitus without complications: Secondary | ICD-10-CM

## 2022-12-26 LAB — COMPREHENSIVE METABOLIC PANEL
ALT: 15 U/L (ref 0–44)
AST: 14 U/L — ABNORMAL LOW (ref 15–41)
Albumin: 3.2 g/dL — ABNORMAL LOW (ref 3.5–5.0)
Alkaline Phosphatase: 79 U/L (ref 38–126)
Anion gap: 11 (ref 5–15)
BUN: 35 mg/dL — ABNORMAL HIGH (ref 8–23)
CO2: 25 mmol/L (ref 22–32)
Calcium: 9.2 mg/dL (ref 8.9–10.3)
Chloride: 101 mmol/L (ref 98–111)
Creatinine, Ser: 1.25 mg/dL — ABNORMAL HIGH (ref 0.44–1.00)
GFR, Estimated: 47 mL/min — ABNORMAL LOW (ref >60)
Glucose, Bld: 217 mg/dL — ABNORMAL HIGH (ref 70–99)
Potassium: 3.4 mmol/L — ABNORMAL LOW (ref 3.5–5.1)
Sodium: 137 mmol/L (ref 135–145)
Total Bilirubin: 0.4 mg/dL (ref 0.3–1.2)
Total Protein: 7.6 g/dL (ref 6.5–8.1)

## 2022-12-26 LAB — URINALYSIS, ROUTINE W REFLEX MICROSCOPIC
Bilirubin Urine: NEGATIVE
Glucose, UA: NEGATIVE mg/dL
Hgb urine dipstick: NEGATIVE
Ketones, ur: NEGATIVE mg/dL
Nitrite: NEGATIVE
Protein, ur: NEGATIVE mg/dL
Specific Gravity, Urine: 1.004 — ABNORMAL LOW (ref 1.005–1.030)
pH: 5 (ref 5.0–8.0)

## 2022-12-26 LAB — CBG MONITORING, ED: Glucose-Capillary: 196 mg/dL — ABNORMAL HIGH (ref 70–99)

## 2022-12-26 LAB — HEMOGLOBIN A1C
Hgb A1c MFr Bld: 10.9 % — ABNORMAL HIGH (ref 4.8–5.6)
Mean Plasma Glucose: 266.13 mg/dL

## 2022-12-26 LAB — CBC WITH DIFFERENTIAL/PLATELET
Abs Immature Granulocytes: 0.02 10*3/uL (ref 0.00–0.07)
Basophils Absolute: 0 10*3/uL (ref 0.0–0.1)
Basophils Relative: 0 %
Eosinophils Absolute: 0.1 10*3/uL (ref 0.0–0.5)
Eosinophils Relative: 1 %
HCT: 36.1 % (ref 36.0–46.0)
Hemoglobin: 11.5 g/dL — ABNORMAL LOW (ref 12.0–15.0)
Immature Granulocytes: 0 %
Lymphocytes Relative: 31 %
Lymphs Abs: 2.5 10*3/uL (ref 0.7–4.0)
MCH: 27.5 pg (ref 26.0–34.0)
MCHC: 31.9 g/dL (ref 30.0–36.0)
MCV: 86.4 fL (ref 80.0–100.0)
Monocytes Absolute: 0.5 10*3/uL (ref 0.1–1.0)
Monocytes Relative: 7 %
Neutro Abs: 4.9 10*3/uL (ref 1.7–7.7)
Neutrophils Relative %: 61 %
Platelets: 272 10*3/uL (ref 150–400)
RBC: 4.18 MIL/uL (ref 3.87–5.11)
RDW: 14.5 % (ref 11.5–15.5)
WBC: 8 10*3/uL (ref 4.0–10.5)
nRBC: 0 % (ref 0.0–0.2)

## 2022-12-26 LAB — I-STAT CHEM 8, ED
BUN: 35 mg/dL — ABNORMAL HIGH (ref 8–23)
Calcium, Ion: 1.18 mmol/L (ref 1.15–1.40)
Chloride: 103 mmol/L (ref 98–111)
Creatinine, Ser: 1.2 mg/dL — ABNORMAL HIGH (ref 0.44–1.00)
Glucose, Bld: 217 mg/dL — ABNORMAL HIGH (ref 70–99)
HCT: 36 % (ref 36.0–46.0)
Hemoglobin: 12.2 g/dL (ref 12.0–15.0)
Potassium: 3.5 mmol/L (ref 3.5–5.1)
Sodium: 138 mmol/L (ref 135–145)
TCO2: 24 mmol/L (ref 22–32)

## 2022-12-26 LAB — LIPASE, BLOOD: Lipase: 50 U/L (ref 11–51)

## 2022-12-26 MED ORDER — IOHEXOL 350 MG/ML SOLN
70.0000 mL | Freq: Once | INTRAVENOUS | Status: AC | PRN
  Administered 2022-12-26: 70 mL via INTRAVENOUS

## 2022-12-26 MED ORDER — ACETAMINOPHEN 650 MG RE SUPP: 650.0000 mg | Freq: Four times a day (QID) | RECTAL | Status: DC | PRN

## 2022-12-26 MED ORDER — INSULIN DETEMIR 100 UNIT/ML ~~LOC~~ SOLN
10.0000 [IU] | Freq: Every day | SUBCUTANEOUS | Status: DC
  Administered 2022-12-27 – 2022-12-30 (×5): 10 [IU] via SUBCUTANEOUS
  Filled 2022-12-26 (×7): qty 0.1

## 2022-12-26 MED ORDER — INSULIN ASPART 100 UNIT/ML IJ SOLN
0.0000 [IU] | INTRAMUSCULAR | Status: DC
  Administered 2022-12-26: 2 [IU] via SUBCUTANEOUS
  Administered 2022-12-27: 3 [IU] via SUBCUTANEOUS
  Administered 2022-12-27 (×2): 2 [IU] via SUBCUTANEOUS
  Administered 2022-12-27 – 2022-12-28 (×3): 3 [IU] via SUBCUTANEOUS
  Administered 2022-12-28: 1 [IU] via SUBCUTANEOUS
  Administered 2022-12-28 (×3): 2 [IU] via SUBCUTANEOUS
  Administered 2022-12-28: 1 [IU] via SUBCUTANEOUS
  Administered 2022-12-28 – 2022-12-29 (×2): 3 [IU] via SUBCUTANEOUS
  Administered 2022-12-29 (×2): 1 [IU] via SUBCUTANEOUS
  Administered 2022-12-29: 2 [IU] via SUBCUTANEOUS
  Administered 2022-12-29: 1 [IU] via SUBCUTANEOUS
  Administered 2022-12-30: 2 [IU] via SUBCUTANEOUS
  Administered 2022-12-30: 1 [IU] via SUBCUTANEOUS
  Administered 2022-12-30: 2 [IU] via SUBCUTANEOUS
  Administered 2022-12-31: 3 [IU] via SUBCUTANEOUS
  Administered 2022-12-31: 2 [IU] via SUBCUTANEOUS
  Administered 2022-12-31: 1 [IU] via SUBCUTANEOUS
  Administered 2022-12-31: 2 [IU] via SUBCUTANEOUS
  Administered 2023-01-01: 1 [IU] via SUBCUTANEOUS
  Administered 2023-01-01: 2 [IU] via SUBCUTANEOUS
  Administered 2023-01-01: 3 [IU] via SUBCUTANEOUS
  Administered 2023-01-01: 1 [IU] via SUBCUTANEOUS
  Administered 2023-01-01 – 2023-01-02 (×2): 2 [IU] via SUBCUTANEOUS
  Administered 2023-01-02: 3 [IU] via SUBCUTANEOUS
  Administered 2023-01-02: 2 [IU] via SUBCUTANEOUS

## 2022-12-26 MED ORDER — ATENOLOL 25 MG PO TABS
50.0000 mg | ORAL_TABLET | Freq: Every day | ORAL | Status: DC
  Filled 2022-12-26: qty 2

## 2022-12-26 MED ORDER — PIPERACILLIN-TAZOBACTAM 3.375 G IVPB 30 MIN
3.3750 g | Freq: Once | INTRAVENOUS | Status: AC
  Administered 2022-12-26: 3.375 g via INTRAVENOUS
  Filled 2022-12-26: qty 50

## 2022-12-26 MED ORDER — CHLORTHALIDONE 25 MG PO TABS
25.0000 mg | ORAL_TABLET | Freq: Every day | ORAL | Status: DC
  Filled 2022-12-26: qty 1

## 2022-12-26 MED ORDER — PIPERACILLIN-TAZOBACTAM 3.375 G IVPB
3.3750 g | Freq: Three times a day (TID) | INTRAVENOUS | Status: DC
  Administered 2022-12-27 – 2023-01-02 (×19): 3.375 g via INTRAVENOUS
  Filled 2022-12-26 (×19): qty 50

## 2022-12-26 MED ORDER — ONDANSETRON HCL 4 MG PO TABS: 4.0000 mg | ORAL_TABLET | Freq: Four times a day (QID) | ORAL | Status: DC | PRN

## 2022-12-26 MED ORDER — OXYCODONE-ACETAMINOPHEN 5-325 MG PO TABS
1.0000 | ORAL_TABLET | ORAL | Status: DC | PRN
  Administered 2022-12-27 (×2): 2 via ORAL
  Administered 2022-12-28: 1 via ORAL
  Administered 2022-12-29: 2 via ORAL
  Administered 2022-12-29: 1 via ORAL
  Administered 2022-12-30 – 2022-12-31 (×3): 2 via ORAL
  Administered 2022-12-31: 1 via ORAL
  Administered 2023-01-01 (×3): 2 via ORAL
  Administered 2023-01-02: 1 via ORAL
  Administered 2023-01-02 – 2023-01-03 (×4): 2 via ORAL
  Administered 2023-01-03: 1 via ORAL
  Administered 2023-01-04 (×2): 2 via ORAL
  Filled 2022-12-26 (×2): qty 2
  Filled 2022-12-26: qty 1
  Filled 2022-12-26 (×7): qty 2
  Filled 2022-12-26: qty 1
  Filled 2022-12-26 (×2): qty 2
  Filled 2022-12-26 (×2): qty 1
  Filled 2022-12-26 (×4): qty 2
  Filled 2022-12-26: qty 1
  Filled 2022-12-26: qty 2

## 2022-12-26 MED ORDER — ACETAMINOPHEN 325 MG PO TABS: 650.0000 mg | ORAL_TABLET | Freq: Four times a day (QID) | ORAL | Status: DC | PRN

## 2022-12-26 MED ORDER — LOSARTAN POTASSIUM 50 MG PO TABS
100.0000 mg | ORAL_TABLET | Freq: Every day | ORAL | Status: DC
  Administered 2022-12-27 – 2022-12-29 (×3): 100 mg via ORAL
  Filled 2022-12-26 (×3): qty 2

## 2022-12-26 MED ORDER — ONDANSETRON HCL 4 MG/2ML IJ SOLN: 4.0000 mg | Freq: Four times a day (QID) | INTRAMUSCULAR | Status: DC | PRN

## 2022-12-26 MED ORDER — ATENOLOL-CHLORTHALIDONE 50-25 MG PO TABS: 1.0000 | ORAL_TABLET | Freq: Every day | ORAL | Status: DC

## 2022-12-26 MED ORDER — ATORVASTATIN CALCIUM 10 MG PO TABS
20.0000 mg | ORAL_TABLET | Freq: Every day | ORAL | Status: DC
  Administered 2022-12-27 – 2022-12-29 (×3): 20 mg via ORAL
  Filled 2022-12-26 (×3): qty 2

## 2022-12-26 NOTE — ED Provider Notes (Signed)
[place] [facility] AT [facility] Provider Note   CSN: [id number] Arrival date & time: 12/26/22  1247     History  Chief Complaint  Patient presents with   Abdominal Pain    Jillian Montgomery is a 67 y.o. female with a PMHx of type 2 DM, HTN, who presents to the ED with concerns for mid to suprapubic abdominal pain x 1 month. Notes her pain has been intermittent. Denies any pain at this time. Has assoicated watery non-bloody diarrhea (chronic per patient) and resolved emesis episode x one 2 days ago. Notes history of abdominal hysterectomy in the 80s. Denies chest pain, shortness of breath, abdominal bloating, fever, urinary symptoms, constipation, nausea.   Per pt chart review: Pt was evaluated by her PCP today at [facility] and sent to the ED to rule out bowel obstruction due to patient symptoms/presentation.    The history is provided by the patient. No language interpreter was used.       Home Medications Prior to Admission medications   Medication Sig Start Date End Date Taking? Authorizing Provider  atenolol-chlorthalidone (TENORETIC) 50-25 MG per tablet 1 tablet    [provider]  atorvastatin (LIPITOR) 20 MG tablet Take 20 mg by mouth daily. 09/05/17   [provider]  Empagliflozin (JARDIANCE) 25 MG TABS Take by mouth. 03/26/14   Rachael Fee, MD  fluorouracil (EFUDEX) 5 % cream Apply topically 2 (two) times daily. 11/13/17   Hyatt, Max T, DPM  insulin detemir (LEVEMIR) 100 UNIT/ML injection Inject into the skin at bedtime. 03/26/14   Rachael Fee, MD  metroNIDAZOLE (FLAGYL) 250 MG tablet TAKE 1 TABLET 3 TIMES A DAY UNTIL ALL ARE TAKEN 10/16/17   [provider]  NOVOLOG FLEXPEN 100 UNIT/ML FlexPen INJECT 5-10 UNITS THREE TIMES A DAY SUBCUTANEOUS 30 DAYS 09/28/17   [provider]  oxyCODONE-acetaminophen (PERCOCET/ROXICET) 5-325 MG tablet Take 1 tablet by mouth every 4 (four) hours as needed. Patient not taking:  Reported on 11/13/2017 12/16/15   Garlon Hatchet, PA-C  traMADol (ULTRAM) 50 MG tablet Take 1 tablet (50 mg total) by mouth every 6 (six) hours as needed. 03/26/14   Rachael Fee, MD      Allergies    Lisinopril    Review of Systems   Review of Systems  Constitutional:  Negative for fever.  Respiratory:  Negative for shortness of breath.   Cardiovascular:  Negative for chest pain.  Gastrointestinal:  Positive for abdominal pain, diarrhea (watery, non-bloody) and vomiting (resolved, 2 days ago). Negative for constipation and nausea.  Genitourinary:  Negative for dysuria and hematuria.  All other systems reviewed and are negative.   Physical Exam Updated Vital Signs BP (!) 190/71   Pulse 63   Temp 97.8 F (36.6 C) (Oral)   Resp 14   Ht 5\' 2"  (1.575 m)   Wt 66.2 kg   SpO2 100%   BMI 26.70 kg/m  Physical Exam Vitals and nursing note reviewed.  Constitutional:      General: She is not in acute distress.    Appearance: She is not diaphoretic.  HENT:     Head: Normocephalic and atraumatic.     Mouth/Throat:     Pharynx: No oropharyngeal exudate.  Eyes:     General: No scleral icterus.    Conjunctiva/sclera: Conjunctivae normal.  Cardiovascular:     Rate and Rhythm: Normal rate and regular rhythm.     Pulses: Normal pulses.  Heart sounds: Normal heart sounds.  Pulmonary:     Effort: Pulmonary effort is normal. No respiratory distress.     Breath sounds: Normal breath sounds. No wheezing.  Abdominal:     General: Bowel sounds are normal.     Palpations: Abdomen is soft. There is no mass.     Tenderness: There is abdominal tenderness in the right upper quadrant, epigastric area, periumbilical area and left upper quadrant. There is no guarding or rebound.     Comments: TTP noted to epigastric, upper abdominal, and mid abdomen.  Musculoskeletal:        General: Normal range of motion.     Cervical back: Normal range of motion and neck supple.  Skin:    General: Skin  is warm and dry.  Neurological:     Mental Status: She is alert.  Psychiatric:        Behavior: Behavior normal.     ED Results / Procedures / Treatments   Labs (all labs ordered are listed, but only abnormal results are displayed) Labs Reviewed  URINALYSIS, ROUTINE W REFLEX MICROSCOPIC - Abnormal; Notable for the following components:      Result Value   Color, Urine COLORLESS (*)    Specific Gravity, Urine 1.004 (*)    Leukocytes,Ua SMALL (*)    Bacteria, UA MANY (*)    All other components within normal limits  CBC WITH DIFFERENTIAL/PLATELET - Abnormal; Notable for the following components:   Hemoglobin 11.5 (*)    All other components within normal limits  COMPREHENSIVE METABOLIC PANEL - Abnormal; Notable for the following components:   Potassium 3.4 (*)    Glucose, Bld 217 (*)    BUN 35 (*)    Creatinine, Ser 1.25 (*)    Albumin 3.2 (*)    AST 14 (*)    GFR, Estimated 47 (*)    All other components within normal limits  I-STAT CHEM 8, ED - Abnormal; Notable for the following components:   BUN 35 (*)    Creatinine, Ser 1.20 (*)    Glucose, Bld 217 (*)    All other components within normal limits  LIPASE, BLOOD    EKG None  Radiology No results found.  Procedures Procedures    Medications Ordered in ED Medications - No data to display  ED Course/ Medical Decision Making/ A&P                             Medical Decision Making Amount and/or Complexity of Data Reviewed Labs: ordered. Radiology: ordered.   Patient presents to the emergency department with diffuse abdominal pain onset 1 month. On exam patient with TTP noted to epigastric, upper abdominal, and mid abdomen. Remainder of exam without acute findings.  Pt afebrile. Differential diagnosis includes pancreatitis, cholecystitis, appendicitis, acute cystitis, bowel obstruction.   Additional history obtained:  External records from outside source obtained and reviewed including: Pt was evaluated  by her PCP today at [facility] and sent to the ED to rule out bowel obstruction due to patient symptoms/presentation.   Labs:  I ordered, and personally interpreted labs.  The pertinent results include:  I stat chem 8 with creatinine at 1.2, BUN at 35 otherwise unremarkable Lipase unremarkable CMP with creatinine at 1.25, BUN at 35, glucose at 217, GFR at 47, otherwise unremarkable CBC unremarkable UA unremarkable  Imaging: I ordered imaging studies including CT AP ordered with results pending at time of sign out.  Patient case discussed with Valrie Hart, PA-C at sign-out. Plan at sign-out is pending CT. Disposition as per oncoming team, however, plans may change as per oncoming team. Patient care transferred at sign out.    This chart was dictated using voice recognition software, Dragon. Despite the best efforts of this provider to proofread and correct errors, errors may still occur which can change documentation meaning.   Final Clinical Impression(s) / ED Diagnoses Final diagnoses:  None    Rx / DC Orders ED Discharge Orders     None         Jadier Rockers A, PA-C 12/26/22 1510    Tegeler, Canary Brim, MD 12/26/22 1521

## 2022-12-26 NOTE — ED Triage Notes (Signed)
Pt was at her PCP today with complaints of intermittent abdominal pain x1 month. Pt has had diarrhea as well but reports she 'always' has diarrhea. Denies nausea and only vomiting over the last month was one time on Sunday. PCP was concerned due to hypoactive bowel sounds.  Of note pt is noncompliant with her medications

## 2022-12-26 NOTE — ED Notes (Signed)
ED TO INPATIENT HANDOFF REPORT  ED Nurse Name and Phone #: Grover Canavan 36  S Name/Age/Gender Jillian Montgomery 68 y.o. female Room/Bed: 027C/027C  Code Status   Code Status: Full Code  Home/SNF/Other Home Patient oriented to: self, place, time, and situation Is this baseline? Yes   Triage Complete: Triage complete  Chief Complaint Diverticulitis of large intestine with abscess [K57.20]  Triage Note Pt was at her PCP today with complaints of intermittent abdominal pain x1 month. Pt has had diarrhea as well but reports she 'always' has diarrhea. Denies nausea and only vomiting over the last month was one time on Sunday. PCP was concerned due to hypoactive bowel sounds.  Of note pt is noncompliant with her medications   Allergies Allergies  Allergen Reactions   Lisinopril Swelling    Pt stated, "lips swell up"   Metformin Diarrhea    Level of Care/Admitting Diagnosis ED Disposition   ED Disposition: Admit Condition: None Comment: Hospital Area: [facility] [[id number]]  Level of Care: Med-Surg [16]  May admit patient to [facility] or [facility] if equivalent level of care is available:: No  Covid Evaluation: Asymptomatic - no recent exposure (last 10 days) testing not required  Diagnosis: Diverticulitis of large intestine with abscess [[id number]]  Admitting Physician: Hillary Bow [[id number]]  Attending Physician: Hillary Bow [phone]  Certification:: I certify this patient will need inpatient services for at least 2 midnights  Estimated Length of Stay: 4      B Medical/Surgery History Past Medical History:  Diagnosis Date   Chronic kidney disease, stage III (moderate) (HCC)    Other and unspecified hyperlipidemia    Type II or unspecified type diabetes mellitus without mention of complication, not stated as uncontrolled    Unspecified essential hypertension    History reviewed. No pertinent surgical history.   A IV  Location/Drains/Wounds Patient Lines/Drains/Airways Status     Active Line/Drains/Airways     Name Placement date Placement time Site Days   Peripheral IV 12/26/22 20 G Left Antecubital 12/26/22  1343  Antecubital  less than 1            Intake/Output Last 24 hours No intake or output data in the 24 hours ending 12/26/22 2039  Labs/Imaging Results for orders placed or performed during the hospital encounter of 12/26/22 (from the past 48 hour(s))  Urinalysis, Routine w reflex microscopic -Urine, Clean Catch     Status: Abnormal   Collection Time: 12/26/22 12:55 PM  Result Value Ref Range   Color, Urine COLORLESS (A) YELLOW   APPearance CLEAR CLEAR   Specific Gravity, Urine 1.004 (L) 1.005 - 1.030   pH 5.0 5.0 - 8.0   Glucose, UA NEGATIVE NEGATIVE mg/dL   Hgb urine dipstick NEGATIVE NEGATIVE   Bilirubin Urine NEGATIVE NEGATIVE   Ketones, ur NEGATIVE NEGATIVE mg/dL   Protein, ur NEGATIVE NEGATIVE mg/dL   Nitrite NEGATIVE NEGATIVE   Leukocytes,Ua SMALL (A) NEGATIVE   RBC / HPF 0-5 0 - 5 RBC/hpf   WBC, UA 6-10 0 - 5 WBC/hpf   Bacteria, UA MANY (A) NONE SEEN   Squamous Epithelial / HPF 0-5 0 - 5 /HPF    Comment: Performed at [facility], [address], [place], [place] [address]  CBC with Differential     Status: Abnormal   Collection Time: 12/26/22  1:41 PM  Result Value Ref Range   WBC 8.0 4.0 - 10.5 K/uL   RBC 4.18 3.87 -  5.11 MIL/uL   Hemoglobin 11.5 (L) 12.0 - 15.0 g/dL   HCT 16.1 09.6 - 04.5 %   MCV 86.4 80.0 - 100.0 fL   MCH 27.5 26.0 - 34.0 pg   MCHC 31.9 30.0 - 36.0 g/dL   RDW 40.9 81.1 - 91.4 %   Platelets 272 150 - 400 K/uL   nRBC 0.0 0.0 - 0.2 %   Neutrophils Relative % 61 %   Neutro Abs 4.9 1.7 - 7.7 K/uL   Lymphocytes Relative 31 %   Lymphs Abs 2.5 0.7 - 4.0 K/uL   Monocytes Relative 7 %   Monocytes Absolute 0.5 0.1 - 1.0 K/uL   Eosinophils Relative 1 %   Eosinophils Absolute 0.1 0.0 - 0.5 K/uL   Basophils Relative 0 %   Basophils Absolute  0.0 0.0 - 0.1 K/uL   Immature Granulocytes 0 %   Abs Immature Granulocytes 0.02 0.00 - 0.07 K/uL    Comment: Performed at [facility], [address], [place], [place] [address]  Comprehensive metabolic panel     Status: Abnormal   Collection Time: 12/26/22  1:41 PM  Result Value Ref Range   Sodium 137 135 - 145 mmol/L   Potassium 3.4 (L) 3.5 - 5.1 mmol/L   Chloride 101 98 - 111 mmol/L   CO2 25 22 - 32 mmol/L   Glucose, Bld 217 (H) 70 - 99 mg/dL    Comment: Glucose reference range applies only to samples taken after fasting for at least 8 hours.   BUN 35 (H) 8 - 23 mg/dL   Creatinine, Ser 6.21 (H) 0.44 - 1.00 mg/dL   Calcium 9.2 8.9 - 30.8 mg/dL   Total Protein 7.6 6.5 - 8.1 g/dL   Albumin 3.2 (L) 3.5 - 5.0 g/dL   AST 14 (L) 15 - 41 U/L   ALT 15 0 - 44 U/L   Alkaline Phosphatase 79 38 - 126 U/L   Total Bilirubin 0.4 0.3 - 1.2 mg/dL   GFR, Estimated 47 (L) >60 mL/min    Comment: (NOTE) Calculated using the CKD-EPI Creatinine Equation (2021)    Anion gap 11 5 - 15    Comment: Performed at [facility], [address], [place], [place] [address]  Lipase, blood     Status: None   Collection Time: 12/26/22  1:41 PM  Result Value Ref Range   Lipase 50 11 - 51 U/L    Comment: Performed at [facility], [address], [place], [place] [address]  I-stat chem 8, ED     Status: Abnormal   Collection Time: 12/26/22  1:46 PM  Result Value Ref Range   Sodium 138 135 - 145 mmol/L   Potassium 3.5 3.5 - 5.1 mmol/L   Chloride 103 98 - 111 mmol/L   BUN 35 (H) 8 - 23 mg/dL   Creatinine, Ser 5.28 (H) 0.44 - 1.00 mg/dL   Glucose, Bld 413 (H) 70 - 99 mg/dL    Comment: Glucose reference range applies only to samples taken after fasting for at least 8 hours.   Calcium, Ion 1.18 1.15 - 1.40 mmol/L   TCO2 24 22 - 32 mmol/L   Hemoglobin 12.2 12.0 - 15.0 g/dL   HCT 24.4 01.0 - 27.2 %   CT ABDOMEN PELVIS W CONTRAST  Result Date: 12/26/2022 CLINICAL DATA:  Abdominal pain EXAM:  CT ABDOMEN AND PELVIS WITH CONTRAST TECHNIQUE: Multidetector CT imaging of the abdomen and pelvis was performed using  the standard protocol following bolus administration of intravenous contrast. RADIATION DOSE REDUCTION: This exam was performed according to the departmental dose-optimization program which includes automated exposure control, adjustment of the mA and/or kV according to patient size and/or use of iterative reconstruction technique. CONTRAST:  70mL OMNIPAQUE IOHEXOL 350 MG/ML SOLN COMPARISON:  04/03/2011 FINDINGS: Lower chest: No acute findings are seen in lower lung fields. Hepatobiliary: No focal abnormalities are seen in liver. There is no dilation of bile ducts. Gallbladder is unremarkable. Pancreas: There is mild prominence of pancreatic duct. No focal abnormalities are seen. Spleen: There are small calcified nodules, possibly granulomas in spleen. Adrenals/Urinary Tract: There is 4 cm nodule in right adrenal with no significant interval change. There is no hydronephrosis. There are no renal or ureteral stones. There is 11 mm smooth marginated fluid density lesion in the upper pole of right kidney suggesting renal cyst. Stomach/Bowel: Stomach is unremarkable. Small bowel loops are not dilated. Appendix is not dilated. Multiple diverticula are seen in colon. There is wall thickening in the distal descending colon and in proximal sigmoid colon. There is 4.2 x 2.6 cm loculated structure in the left margin of junction of descending and sigmoid colon containing pockets of air and possibly stool. Possibility of contained perforation is not excluded. Perirectal soft tissues are unremarkable. Vascular/Lymphatic: Scattered arterial calcifications are seen. Reproductive: Uterus is not seen.  There are no adnexal masses. Other: There is no ascites or pneumoperitoneum. Small umbilical hernia containing fat is seen. Musculoskeletal: Degenerative changes are noted in lumbar spine, more so at L4-L5 level.  IMPRESSION: Diverticulosis of colon. There is abnormal wall thickening and pericolic stranding at the junction of descending and sigmoid colon in left iliac fossa suggesting acute diverticulitis. There is 4.2 x 2.6 cm area of fluid and stool in the left margin of: In left iliac fossa. Possibility of contained perforation with abscess should be considered. Short-term follow-up CT may be considered to re-evaluate this finding. There is no evidence of intestinal obstruction or pneumoperitoneum. There is no hydronephrosis. Appendix is not dilated. There is 4 cm nodule in right adrenal with no significant change suggesting possible adenoma. Other findings as described in the body of the report. Electronically Signed   By: Ernie Avena M.D.   On: 12/26/2022 16:46    Pending Labs Unresulted Labs (From admission, onward)     Start     Ordered   12/27/22 0500  CBC  Tomorrow morning,   R        12/26/22 2007   12/27/22 0500  Basic metabolic panel  Tomorrow morning,   R        12/26/22 2007   12/26/22 2026  Hemoglobin A1c  Once,   R       Comments: To assess prior glycemic control    12/26/22 2026   12/26/22 2004  HIV Antibody (routine testing w rflx)  (HIV Antibody (Routine testing w reflex) panel)  Once,   R        12/26/22 2007            Vitals/Pain Today's Vitals   12/26/22 1252 12/26/22 1253 12/26/22 1300 12/26/22 1640  BP: (Abnormal) 190/71  (Abnormal) 171/82 (Abnormal) 166/74  Pulse: 63  65 65  Resp: 14  17 17   Temp: 97.8 F (36.6 C)   97.9 F (36.6 C)  TempSrc: Oral   Oral  SpO2: 100%  99% 99%  Weight:  66.2 kg    Height:  5\' 2"  (1.575 m)  PainSc:  1       Isolation Precautions No active isolations  Medications Medications  piperacillin-tazobactam (ZOSYN) IVPB 3.375 g (0 g Intravenous Stopped 12/26/22 2039)    Followed by  piperacillin-tazobactam (ZOSYN) IVPB 3.375 g (has no administration in time range)  acetaminophen (TYLENOL) tablet 650 mg (has no  administration in time range)    Or  acetaminophen (TYLENOL) suppository 650 mg (has no administration in time range)  ondansetron (ZOFRAN) tablet 4 mg (has no administration in time range)    Or  ondansetron (ZOFRAN) injection 4 mg (has no administration in time range)  oxyCODONE-acetaminophen (PERCOCET/ROXICET) 5-325 MG per tablet 1-2 tablet (has no administration in time range)  insulin detemir (LEVEMIR) injection 10 Units (has no administration in time range)  losartan (COZAAR) tablet 100 mg (has no administration in time range)  atorvastatin (LIPITOR) tablet 20 mg (has no administration in time range)  insulin aspart (novoLOG) injection 0-9 Units (has no administration in time range)  atenolol (TENORMIN) tablet 50 mg (has no administration in time range)    And  chlorthalidone (HYGROTON) tablet 25 mg (has no administration in time range)  iohexol (OMNIPAQUE) 350 MG/ML injection 70 mL (70 mLs Intravenous Contrast Given 12/26/22 1518)    Mobility walks     Focused Assessments GI   R Recommendations: See Admitting Provider Note  Report given to:   Additional Notes:

## 2022-12-26 NOTE — Progress Notes (Signed)
Pharmacy Antibiotic Note  Jillian Montgomery is a 67 y.o. female admitted on 12/26/2022 with  intra-abdominal infection . Patient presents with diverticulitis and possible contained perforation and plans for drain placement by IR. Pharmacy has been consulted for zosyn dosing. WBC 8, sCr 1.2, afebrile  Plan: Zosyn 3.375g IV q8h (4 hour infusion). Monitor renal function, WBC, fever curve, culture data Follow up for signs of clinical improvement  Follow up IR plans for drain  Height: 5\' 2"  (157.5 cm) Weight: 66.2 kg (146 lb) IBW/kg (Calculated) : 50.1  Temp (24hrs), Avg:97.9 F (36.6 C), Min:97.8 F (36.6 C), Max:97.9 F (36.6 C)  Recent Labs  Lab 12/26/22 1341 12/26/22 1346  WBC 8.0  --   CREATININE 1.25* 1.20*    Estimated Creatinine Clearance: 40 mL/min (A) (by C-G formula based on SCr of 1.2 mg/dL (H)).    Allergies  Allergen Reactions   Lisinopril Swelling    Pt stated, "lips swell up"    Antimicrobials this admission: Zosyn 6/11 >>   Thank you for allowing pharmacy to be a part of this patient's care.  Arabella Merles, PharmD. [facility] Acute Care PGY-1 12/26/2022 8:07 PM

## 2022-12-26 NOTE — Assessment & Plan Note (Signed)
Creat 1.25, suspected to be baseline

## 2022-12-26 NOTE — ED Notes (Signed)
Okay'd per Jillian Montgomery for patient to have clear liquids; pt given diet shasta sprite and chicken broth

## 2022-12-26 NOTE — H&P (Signed)
History and Physical    Patient: Jillian Montgomery ZOX:[id number] DOB: [birth year] DOA: 12/26/2022 DOS: the patient was seen and examined on 12/26/2022 PCP: No primary care provider on file.  Patient coming from: Home  Chief Complaint:  Chief Complaint  Patient presents with   Abdominal Pain   HPI: Jillian Montgomery is a 67 y.o. female with medical history significant of DM2, HTN, CKD3.  Pt in to ED with c/o intermittent abd pain for past month.  Having BMs.  H/o diverticulitis with last flare in late 90s.  Last colonoscopy x10 years ago.  No polyps per pt.  Has sister with Crohns.  Aunt and niece (age 48) with h/o colon CA.  Prior abdominal hysterectomy and unilateral oopherectomy in 1988, no other abdominal surgery.   Work up in ED shows diverticulitis with abscess / contained perf.  Review of Systems: As mentioned in the history of present illness. All other systems reviewed and are negative. Past Medical History:  Diagnosis Date   Chronic kidney disease, stage III (moderate) (HCC)    Other and unspecified hyperlipidemia    Type II or unspecified type diabetes mellitus without mention of complication, not stated as uncontrolled    Unspecified essential hypertension    History reviewed. No pertinent surgical history. Social History:  reports that she has never smoked. She has never used smokeless tobacco. No history on file for alcohol use and drug use.  Allergies  Allergen Reactions   Lisinopril Swelling    Pt stated, "lips swell up"   Metformin Diarrhea    Family History  Problem Relation Age of Onset   Breast cancer Mother     Prior to Admission medications   Medication Sig Start Date End Date Taking? Authorizing Provider  atenolol-chlorthalidone (TENORETIC) 50-25 MG per tablet Take 1 tablet by mouth daily.   Yes [provider]  atorvastatin (LIPITOR) 20 MG tablet Take 20 mg by mouth daily. 09/05/17  Yes [provider]  insulin detemir (LEVEMIR) 100  UNIT/ML injection Inject 16 Units into the skin at bedtime. 03/26/14  Yes Rachael Fee, MD  losartan (COZAAR) 100 MG tablet Take 100 mg by mouth daily.   Yes [provider]    Physical Exam: Vitals:   12/26/22 1252 12/26/22 1253 12/26/22 1300 12/26/22 1640  BP: (!) 190/71  (!) 171/82 (!) 166/74  Pulse: 63  65 65  Resp: 14  17 17   Temp: 97.8 F (36.6 C)   97.9 F (36.6 C)  TempSrc: Oral   Oral  SpO2: 100%  99% 99%  Weight:  66.2 kg    Height:  5\' 2"  (1.575 m)     Constitutional: NAD, calm, comfortable Respiratory: clear to auscultation bilaterally, no wheezing, no crackles. Normal respiratory effort. No accessory muscle use.  Cardiovascular: Regular rate and rhythm, no murmurs / rubs / gallops. No extremity edema. 2+ pedal pulses. No carotid bruits.  Abdomen: Central TTP, no rebound Neurologic: CN 2-12 grossly intact. Sensation intact, DTR normal. Strength 5/5 in all 4.  Psychiatric: Normal judgment and insight. Alert and oriented x 3. Normal mood.   Data Reviewed:    Labs on Admission: I have personally reviewed following labs and imaging studies  CBC: Recent Labs  Lab 12/26/22 1341 12/26/22 1346  WBC 8.0  --   NEUTROABS 4.9  --   HGB 11.5* 12.2  HCT 36.1 36.0  MCV 86.4  --   PLT 272  --    Basic Metabolic Panel: Recent Labs  Lab 12/26/22 1341 12/26/22 1346  NA 137 138  K 3.4* 3.5  CL 101 103  CO2 25  --   GLUCOSE 217* 217*  BUN 35* 35*  CREATININE 1.25* 1.20*  CALCIUM 9.2  --    GFR: Estimated Creatinine Clearance: 40 mL/min (A) (by C-G formula based on SCr of 1.2 mg/dL (H)). Liver Function Tests: Recent Labs  Lab 12/26/22 1341  AST 14*  ALT 15  ALKPHOS 79  BILITOT 0.4  PROT 7.6  ALBUMIN 3.2*   Recent Labs  Lab 12/26/22 1341  LIPASE 50   No results for input(s): "AMMONIA" in the last 168 hours. Coagulation Profile: No results for input(s): "INR", "PROTIME" in the last 168 hours. Cardiac Enzymes: No results for input(s):  "CKTOTAL", "CKMB", "CKMBINDEX", "TROPONINI" in the last 168 hours. BNP (last 3 results) No results for input(s): "PROBNP" in the last 8760 hours. HbA1C: No results for input(s): "HGBA1C" in the last 72 hours. CBG: Recent Labs  Lab 12/26/22 2049  GLUCAP 196*   Lipid Profile: No results for input(s): "CHOL", "HDL", "LDLCALC", "TRIG", "CHOLHDL", "LDLDIRECT" in the last 72 hours. Thyroid Function Tests: No results for input(s): "TSH", "T4TOTAL", "FREET4", "T3FREE", "THYROIDAB" in the last 72 hours. Anemia Panel: No results for input(s): "VITAMINB12", "FOLATE", "FERRITIN", "TIBC", "IRON", "RETICCTPCT" in the last 72 hours. Urine analysis:    Component Value Date/Time   COLORURINE COLORLESS (A) 12/26/2022 1255   APPEARANCEUR CLEAR 12/26/2022 1255   LABSPEC 1.004 (L) 12/26/2022 1255   PHURINE 5.0 12/26/2022 1255   GLUCOSEU NEGATIVE 12/26/2022 1255   HGBUR NEGATIVE 12/26/2022 1255   BILIRUBINUR NEGATIVE 12/26/2022 1255   KETONESUR NEGATIVE 12/26/2022 1255   PROTEINUR NEGATIVE 12/26/2022 1255   UROBILINOGEN 0.2 04/03/2011 1933   NITRITE NEGATIVE 12/26/2022 1255   LEUKOCYTESUR SMALL (A) 12/26/2022 1255    Radiological Exams on Admission: CT ABDOMEN PELVIS W CONTRAST  Result Date: 12/26/2022 CLINICAL DATA:  Abdominal pain EXAM: CT ABDOMEN AND PELVIS WITH CONTRAST TECHNIQUE: Multidetector CT imaging of the abdomen and pelvis was performed using the standard protocol following bolus administration of intravenous contrast. RADIATION DOSE REDUCTION: This exam was performed according to the departmental dose-optimization program which includes automated exposure control, adjustment of the mA and/or kV according to patient size and/or use of iterative reconstruction technique. CONTRAST:  70mL OMNIPAQUE IOHEXOL 350 MG/ML SOLN COMPARISON:  04/03/2011 FINDINGS: Lower chest: No acute findings are seen in lower lung fields. Hepatobiliary: No focal abnormalities are seen in liver. There is no dilation  of bile ducts. Gallbladder is unremarkable. Pancreas: There is mild prominence of pancreatic duct. No focal abnormalities are seen. Spleen: There are small calcified nodules, possibly granulomas in spleen. Adrenals/Urinary Tract: There is 4 cm nodule in right adrenal with no significant interval change. There is no hydronephrosis. There are no renal or ureteral stones. There is 11 mm smooth marginated fluid density lesion in the upper pole of right kidney suggesting renal cyst. Stomach/Bowel: Stomach is unremarkable. Small bowel loops are not dilated. Appendix is not dilated. Multiple diverticula are seen in colon. There is wall thickening in the distal descending colon and in proximal sigmoid colon. There is 4.2 x 2.6 cm loculated structure in the left margin of junction of descending and sigmoid colon containing pockets of air and possibly stool. Possibility of contained perforation is not excluded. Perirectal soft tissues are unremarkable. Vascular/Lymphatic: Scattered arterial calcifications are seen. Reproductive: Uterus is not seen.  There are no adnexal masses. Other: There is no ascites or pneumoperitoneum. Small  umbilical hernia containing fat is seen. Musculoskeletal: Degenerative changes are noted in lumbar spine, more so at L4-L5 level. IMPRESSION: Diverticulosis of colon. There is abnormal wall thickening and pericolic stranding at the junction of descending and sigmoid colon in left iliac fossa suggesting acute diverticulitis. There is 4.2 x 2.6 cm area of fluid and stool in the left margin of: In left iliac fossa. Possibility of contained perforation with abscess should be considered. Short-term follow-up CT may be considered to re-evaluate this finding. There is no evidence of intestinal obstruction or pneumoperitoneum. There is no hydronephrosis. Appendix is not dilated. There is 4 cm nodule in right adrenal with no significant change suggesting possible adenoma. Other findings as described in the  body of the report. Electronically Signed   By: Ernie Avena M.D.   On: 12/26/2022 16:46    EKG: Independently reviewed.   Assessment and Plan: * Diverticulitis of large intestine with abscess Zosyn See surg consult note: Soft diet as long as no N/V NPO after MN IR to drain abscess tomorrow Percocet PRN Zofran PRN  HTN (hypertension) Cont tenoretic Cont losartan  CKD (chronic kidney disease) stage 3, GFR 30-59 ml/min (HCC) Creat 1.25, suspected to be baseline  DM2 (diabetes mellitus, type 2) (HCC) Reduce levemir to 10u QHS Add sensitive SSI Q4H      Advance Care Planning:   Code Status: Full Code  Consults: Gen surg, IR  Family Communication: No family in room  Severity of Illness: The appropriate patient status for this patient is INPATIENT. Inpatient status is judged to be reasonable and necessary in order to provide the required intensity of service to ensure the patient's safety. The patient's presenting symptoms, physical exam findings, and initial radiographic and laboratory data in the context of their chronic comorbidities is felt to place them at high risk for further clinical deterioration. Furthermore, it is not anticipated that the patient will be medically stable for discharge from the hospital within 2 midnights of admission.   * I certify that at the point of admission it is my clinical judgment that the patient will require inpatient hospital care spanning beyond 2 midnights from the point of admission due to high intensity of service, high risk for further deterioration and high frequency of surveillance required.*  Author: Hillary Bow., DO 12/26/2022 8:26 PM  For on call review [email or web address].

## 2022-12-26 NOTE — Assessment & Plan Note (Addendum)
Zosyn See surg consult note: Soft diet as long as no N/V NPO after MN IR to drain abscess tomorrow Percocet PRN Zofran PRN

## 2022-12-26 NOTE — Assessment & Plan Note (Signed)
Reduce levemir to 10u QHS Add sensitive SSI Q4H

## 2022-12-26 NOTE — ED Provider Notes (Cosign Needed)
Accepted handoff at shift change from [facility]. Please see prior provider note for more detail.   Briefly: Patient is 67 y.o. "presents to the ED with concerns for mid to suprapubic abdominal pain x 1 month. Notes her pain has been intermittent. Denies any pain at this time. Has assoicated watery non-bloody diarrhea (chronic per patient) and resolved emesis episode x one 2 days ago. "  DDX: concern for pancreatitis, cholecystitis, appendicitis, acute cystitis, bowel obstruction.     MDM: -CT showing diverticulitis with a 4.2 x 2.6 cm area of fluid and stool in the left margin of the left iliac fossa - possibility of contained perforation with abscess.  -consulted with general surgery who recommended inpatient admission for ABX and IR drain placement  -Consulted with Dr. Bryn Gulling with IR who agreed to place drain tomorrow. Patient is currently stable, afebrile, and not on blood thinners.  -Starting patient on Zosyn in ED  -Consulted hospitalist who agreed to admit - Dr. Julian Reil admitting provider. Patient currently stable in ED.   Dorthy Cooler, PA-C 12/26/22 2010    [address], [place] 12/27/22 Leanord Hawking    Glyn Ade, MD 12/27/22 1445

## 2022-12-26 NOTE — Assessment & Plan Note (Signed)
Cont tenoretic Cont losartan

## 2022-12-26 NOTE — Consult Note (Signed)
Reason for Consult/Chief Complaint: diverticulitis Consultant: Sharyl Nimrod, [place]  Jillian Montgomery is an 67 y.o. female.   HPI: 24F with a one month history of waxing and waning abdominal pain that she was planning to non-urgently see her PCP, Dr. Merri [place] for. PCP was out of town and she was not willing to see anyone else in the office, so was just seen today by  Last BM was loose and occurred while she was in the ED. Has been tolerating PO. She reports a history of diverticulitis, last flare in the 90s. Last colonoscopy ~10y ago at [facility], denies any polyps on that scope. She denies any history of IBS, UC. Has a sister with Crohns disease. Paternal aunt and niece with a history of colon cancer. Niece is 64yo and has recently completed surgery and chemotherapy. Prior abdominal hysterectomy and unilateral oopherectomy in 1988, no other abdominal surgery.  Past Medical History:  Diagnosis Date   Chronic kidney disease, stage III (moderate) (HCC)    Other and unspecified hyperlipidemia    Type II or unspecified type diabetes mellitus without mention of complication, not stated as uncontrolled    Unspecified essential hypertension     History reviewed. No pertinent surgical history.  Family History  Problem Relation Age of Onset   Breast cancer Mother     Social History:  reports that she has never smoked. She has never used smokeless tobacco. No history on file for alcohol use and drug use.  Allergies:  Allergies  Allergen Reactions   Lisinopril Swelling    Pt stated, "lips swell up"    Medications: I have reviewed the patient's current medications.  Results for orders placed or performed during the hospital encounter of 12/26/22 (from the past 48 hour(s))  Urinalysis, Routine w reflex microscopic -Urine, Clean Catch     Status: Abnormal   Collection Time: 12/26/22 12:55 PM  Result Value Ref Range   Color, Urine COLORLESS (A) YELLOW   APPearance CLEAR CLEAR   Specific  Gravity, Urine 1.004 (L) 1.005 - 1.030   pH 5.0 5.0 - 8.0   Glucose, UA NEGATIVE NEGATIVE mg/dL   Hgb urine dipstick NEGATIVE NEGATIVE   Bilirubin Urine NEGATIVE NEGATIVE   Ketones, ur NEGATIVE NEGATIVE mg/dL   Protein, ur NEGATIVE NEGATIVE mg/dL   Nitrite NEGATIVE NEGATIVE   Leukocytes,Ua SMALL (A) NEGATIVE   RBC / HPF 0-5 0 - 5 RBC/hpf   WBC, UA 6-10 0 - 5 WBC/hpf   Bacteria, UA MANY (A) NONE SEEN   Squamous Epithelial / HPF 0-5 0 - 5 /HPF    Comment: Performed at [facility], [address], [place], [place] [address]  CBC with Differential     Status: Abnormal   Collection Time: 12/26/22  1:41 PM  Result Value Ref Range   WBC 8.0 4.0 - 10.5 K/uL   RBC 4.18 3.87 - 5.11 MIL/uL   Hemoglobin 11.5 (L) 12.0 - 15.0 g/dL   HCT 60.4 54.0 - 98.1 %   MCV 86.4 80.0 - 100.0 fL   MCH 27.5 26.0 - 34.0 pg   MCHC 31.9 30.0 - 36.0 g/dL   RDW 19.1 47.8 - 29.5 %   Platelets 272 150 - 400 K/uL   nRBC 0.0 0.0 - 0.2 %   Neutrophils Relative % 61 %   Neutro Abs 4.9 1.7 - 7.7 K/uL   Lymphocytes Relative 31 %   Lymphs Abs 2.5 0.7 - 4.0 K/uL   Monocytes Relative  7 %   Monocytes Absolute 0.5 0.1 - 1.0 K/uL   Eosinophils Relative 1 %   Eosinophils Absolute 0.1 0.0 - 0.5 K/uL   Basophils Relative 0 %   Basophils Absolute 0.0 0.0 - 0.1 K/uL   Immature Granulocytes 0 %   Abs Immature Granulocytes 0.02 0.00 - 0.07 K/uL    Comment: Performed at [facility], [address], [place], [place] [address]  Comprehensive metabolic panel     Status: Abnormal   Collection Time: 12/26/22  1:41 PM  Result Value Ref Range   Sodium 137 135 - 145 mmol/L   Potassium 3.4 (L) 3.5 - 5.1 mmol/L   Chloride 101 98 - 111 mmol/L   CO2 25 22 - 32 mmol/L   Glucose, Bld 217 (H) 70 - 99 mg/dL    Comment: Glucose reference range applies only to samples taken after fasting for at least 8 hours.   BUN 35 (H) 8 - 23 mg/dL   Creatinine, Ser 6.04 (H) 0.44 - 1.00 mg/dL   Calcium 9.2 8.9 - 54.0 mg/dL   Total  Protein 7.6 6.5 - 8.1 g/dL   Albumin 3.2 (L) 3.5 - 5.0 g/dL   AST 14 (L) 15 - 41 U/L   ALT 15 0 - 44 U/L   Alkaline Phosphatase 79 38 - 126 U/L   Total Bilirubin 0.4 0.3 - 1.2 mg/dL   GFR, Estimated 47 (L) >60 mL/min    Comment: (NOTE) Calculated using the CKD-EPI Creatinine Equation (2021)    Anion gap 11 5 - 15    Comment: Performed at [facility], [address], [place], [place] [address]  Lipase, blood     Status: None   Collection Time: 12/26/22  1:41 PM  Result Value Ref Range   Lipase 50 11 - 51 U/L    Comment: Performed at [facility], [address], [place], [place] [address]  I-stat chem 8, ED     Status: Abnormal   Collection Time: 12/26/22  1:46 PM  Result Value Ref Range   Sodium 138 135 - 145 mmol/L   Potassium 3.5 3.5 - 5.1 mmol/L   Chloride 103 98 - 111 mmol/L   BUN 35 (H) 8 - 23 mg/dL   Creatinine, Ser 9.56 (H) 0.44 - 1.00 mg/dL   Glucose, Bld 213 (H) 70 - 99 mg/dL    Comment: Glucose reference range applies only to samples taken after fasting for at least 8 hours.   Calcium, Ion 1.18 1.15 - 1.40 mmol/L   TCO2 24 22 - 32 mmol/L   Hemoglobin 12.2 12.0 - 15.0 g/dL   HCT 08.6 57.8 - 46.9 %    CT ABDOMEN PELVIS W CONTRAST  Result Date: 12/26/2022 CLINICAL DATA:  Abdominal pain EXAM: CT ABDOMEN AND PELVIS WITH CONTRAST TECHNIQUE: Multidetector CT imaging of the abdomen and pelvis was performed using the standard protocol following bolus administration of intravenous contrast. RADIATION DOSE REDUCTION: This exam was performed according to the departmental dose-optimization program which includes automated exposure control, adjustment of the mA and/or kV according to patient size and/or use of iterative reconstruction technique. CONTRAST:  70mL OMNIPAQUE IOHEXOL 350 MG/ML SOLN COMPARISON:  04/03/2011 FINDINGS: Lower chest: No acute findings are seen in lower lung fields. Hepatobiliary: No focal abnormalities are seen in liver. There is no dilation of bile  ducts. Gallbladder is unremarkable. Pancreas: There is mild prominence of pancreatic duct. No focal abnormalities are seen. Spleen: There are small  calcified nodules, possibly granulomas in spleen. Adrenals/Urinary Tract: There is 4 cm nodule in right adrenal with no significant interval change. There is no hydronephrosis. There are no renal or ureteral stones. There is 11 mm smooth marginated fluid density lesion in the upper pole of right kidney suggesting renal cyst. Stomach/Bowel: Stomach is unremarkable. Small bowel loops are not dilated. Appendix is not dilated. Multiple diverticula are seen in colon. There is wall thickening in the distal descending colon and in proximal sigmoid colon. There is 4.2 x 2.6 cm loculated structure in the left margin of junction of descending and sigmoid colon containing pockets of air and possibly stool. Possibility of contained perforation is not excluded. Perirectal soft tissues are unremarkable. Vascular/Lymphatic: Scattered arterial calcifications are seen. Reproductive: Uterus is not seen.  There are no adnexal masses. Other: There is no ascites or pneumoperitoneum. Small umbilical hernia containing fat is seen. Musculoskeletal: Degenerative changes are noted in lumbar spine, more so at L4-L5 level. IMPRESSION: Diverticulosis of colon. There is abnormal wall thickening and pericolic stranding at the junction of descending and sigmoid colon in left iliac fossa suggesting acute diverticulitis. There is 4.2 x 2.6 cm area of fluid and stool in the left margin of: In left iliac fossa. Possibility of contained perforation with abscess should be considered. Short-term follow-up CT may be considered to re-evaluate this finding. There is no evidence of intestinal obstruction or pneumoperitoneum. There is no hydronephrosis. Appendix is not dilated. There is 4 cm nodule in right adrenal with no significant change suggesting possible adenoma. Other findings as described in the body of  the report. Electronically Signed   By: Ernie Avena M.D.   On: 12/26/2022 16:46    ROS 10 point review of systems is negative except as listed above in HPI.   Physical Exam Blood pressure (!) 166/74, pulse 65, temperature 97.9 F (36.6 C), temperature source Oral, resp. rate 17, height 5\' 2"  (1.575 m), weight 66.2 kg, SpO2 99 %. Constitutional: well-developed, well-nourished HEENT: pupils equal, round, reactive to light, 2mm b/l, moist conjunctiva, external inspection of ears and nose normal, hearing intact Oropharynx: normal oropharyngeal mucosa, normal dentition Neck: no thyromegaly, trachea midline, no midline cervical tenderness to palpation Chest: breath sounds equal bilaterally, normal respiratory effort, no midline or lateral chest wall tenderness to palpation/deformity Abdomen: soft, central abdominal tenderness, no bruising, no hepatosplenomegaly GU: normal female genitalia  Back: no wounds, no thoracic/lumbar spine tenderness to palpation, no thoracic/lumbar spine stepoffs Rectal: deferred Extremities: 2+ radial and pedal pulses bilaterally, intact motor and sensation bilateral UE and LE, no peripheral edema MSK: unable to assess gait/station, no clubbing/cyanosis of fingers/toes, normal ROM of all four extremities Skin: warm, dry, no rashes Psych: normal memory, normal mood/affect     Assessment/Plan: 40F with diverticulitis and possible contained perforation. Appears well. Recommend abx and IR consult for drain placement. Discussed possibility of colostomy, but at this point, appears to be a reasonable candidate for non-operative management. NPO at midnight for possible IR drain, but otherwise, okay for soft diet as long as no n/v.    Diamantina Monks, MD General and Trauma Surgery [facility]

## 2022-12-27 ENCOUNTER — Inpatient Hospital Stay (HOSPITAL_COMMUNITY): Payer: Medicare HMO

## 2022-12-27 DIAGNOSIS — K5720 Diverticulitis of large intestine with perforation and abscess without bleeding: Secondary | ICD-10-CM | POA: Diagnosis not present

## 2022-12-27 LAB — PROTIME-INR
INR: 1 (ref 0.8–1.2)
Prothrombin Time: 13.8 seconds (ref 11.4–15.2)

## 2022-12-27 LAB — GLUCOSE, CAPILLARY
Glucose-Capillary: 144 mg/dL — ABNORMAL HIGH (ref 70–99)
Glucose-Capillary: 144 mg/dL — ABNORMAL HIGH (ref 70–99)
Glucose-Capillary: 163 mg/dL — ABNORMAL HIGH (ref 70–99)
Glucose-Capillary: 165 mg/dL — ABNORMAL HIGH (ref 70–99)
Glucose-Capillary: 204 mg/dL — ABNORMAL HIGH (ref 70–99)
Glucose-Capillary: 224 mg/dL — ABNORMAL HIGH (ref 70–99)
Glucose-Capillary: 242 mg/dL — ABNORMAL HIGH (ref 70–99)

## 2022-12-27 LAB — CBC
HCT: 35.7 % — ABNORMAL LOW (ref 36.0–46.0)
Hemoglobin: 11.2 g/dL — ABNORMAL LOW (ref 12.0–15.0)
MCH: 26.5 pg (ref 26.0–34.0)
MCHC: 31.4 g/dL (ref 30.0–36.0)
MCV: 84.6 fL (ref 80.0–100.0)
Platelets: 258 10*3/uL (ref 150–400)
RBC: 4.22 MIL/uL (ref 3.87–5.11)
RDW: 14.5 % (ref 11.5–15.5)
WBC: 8.5 10*3/uL (ref 4.0–10.5)
nRBC: 0 % (ref 0.0–0.2)

## 2022-12-27 LAB — BASIC METABOLIC PANEL
Anion gap: 11 (ref 5–15)
BUN: 27 mg/dL — ABNORMAL HIGH (ref 8–23)
CO2: 25 mmol/L (ref 22–32)
Calcium: 9.1 mg/dL (ref 8.9–10.3)
Chloride: 101 mmol/L (ref 98–111)
Creatinine, Ser: 1.22 mg/dL — ABNORMAL HIGH (ref 0.44–1.00)
GFR, Estimated: 48 mL/min — ABNORMAL LOW (ref >60)
Glucose, Bld: 213 mg/dL — ABNORMAL HIGH (ref 70–99)
Potassium: 3.6 mmol/L (ref 3.5–5.1)
Sodium: 137 mmol/L (ref 135–145)

## 2022-12-27 LAB — HIV ANTIBODY (ROUTINE TESTING W REFLEX): HIV Screen 4th Generation wRfx: NONREACTIVE

## 2022-12-27 MED ORDER — MIDAZOLAM HCL 2 MG/2ML IJ SOLN
INTRAMUSCULAR | Status: AC
  Filled 2022-12-27: qty 2

## 2022-12-27 MED ORDER — SODIUM CHLORIDE 0.9% FLUSH
5.0000 mL | Freq: Three times a day (TID) | INTRAVENOUS | Status: DC
  Administered 2022-12-27 – 2023-01-04 (×23): 5 mL

## 2022-12-27 MED ORDER — MIDAZOLAM HCL 2 MG/2ML IJ SOLN
INTRAMUSCULAR | Status: AC | PRN
  Administered 2022-12-27: 1 mg via INTRAVENOUS
  Administered 2022-12-27 (×2): .5 mg via INTRAVENOUS

## 2022-12-27 MED ORDER — FENTANYL CITRATE (PF) 100 MCG/2ML IJ SOLN
INTRAMUSCULAR | Status: AC | PRN
  Administered 2022-12-27: 50 ug via INTRAVENOUS
  Administered 2022-12-27 (×2): 25 ug via INTRAVENOUS

## 2022-12-27 MED ORDER — LIVING WELL WITH DIABETES BOOK
Freq: Once | Status: AC
  Filled 2022-12-27: qty 1

## 2022-12-27 MED ORDER — FENTANYL CITRATE (PF) 100 MCG/2ML IJ SOLN
INTRAMUSCULAR | Status: AC
  Filled 2022-12-27: qty 2

## 2022-12-27 MED ORDER — LIDOCAINE HCL (PF) 1 % IJ SOLN
10.0000 mL | Freq: Once | INTRAMUSCULAR | Status: AC
  Administered 2022-12-27: 10 mL via INTRADERMAL

## 2022-12-27 NOTE — Consult Note (Signed)
Chief Complaint: Patient was seen in consultation today for diverticular abscess drain placement Chief Complaint  Patient presents with   Abdominal Pain   at the request of Dr Bedelia Person  Supervising Physician: Roanna Banning  Patient Status: [name]  History of Present Illness: Jillian Montgomery is a 67 y.o. female   FULL Code status per pt  Worsening abd pain x 1 month N/diarrhea Hx diverticulitis To ED for evaluation  CT yesterday:  IMPRESSION: Diverticulosis of colon. There is abnormal wall thickening and pericolic stranding at the junction of descending and sigmoid colon in left iliac fossa suggesting acute diverticulitis. There is 4.2 x 2.6 cm area of fluid and stool in the left margin of: In left iliac fossa. Possibility of contained perforation with abscess should be considered. Short-term follow-up CT may be considered to re-evaluate this finding. There is no evidence of intestinal obstruction or pneumoperitoneum. There is no hydronephrosis. Appendix is not dilated. There is 4 cm nodule in right adrenal with no significant change suggesting possible adenoma.  Request made for IR drain placement Dr Milford Cage has reviewed imaging and approves procedure   Past Medical History:  Diagnosis Date   Chronic kidney disease, stage III (moderate) (HCC)    Other and unspecified hyperlipidemia    Type II or unspecified type diabetes mellitus without mention of complication, not stated as uncontrolled    Unspecified essential hypertension     History reviewed. No pertinent surgical history.  Allergies: Lisinopril and Metformin  Medications: Prior to Admission medications   Medication Sig Start Date End Date Taking? Authorizing Provider  atenolol-chlorthalidone (TENORETIC) 50-25 MG per tablet Take 1 tablet by mouth daily.   Yes [provider]  atorvastatin (LIPITOR) 20 MG tablet Take 20 mg by mouth daily. 09/05/17  Yes [provider]  insulin  detemir (LEVEMIR) 100 UNIT/ML injection Inject 16 Units into the skin at bedtime. 03/26/14  Yes Rachael Fee, MD  losartan (COZAAR) 100 MG tablet Take 100 mg by mouth daily.   Yes [provider]     Family History  Problem Relation Age of Onset   Breast cancer Mother     Social History   Socioeconomic History   Marital status: Single    Spouse name: Not on file   Number of children: Not on file   Years of education: Not on file   Highest education level: Not on file  Occupational History   Not on file  Tobacco Use   Smoking status: Never   Smokeless tobacco: Never  Substance and Sexual Activity   Alcohol use: Not on file   Drug use: Not on file   Sexual activity: Not on file  Other Topics Concern   Not on file  Social History Narrative   Not on file   Social Determinants of Health   Financial Resource Strain: Not on file  Food Insecurity: No Food Insecurity (12/26/2022)   Hunger Vital Sign    Worried About Running Out of Food in the Last Year: Never true    Ran Out of Food in the Last Year: Never true  Transportation Needs: No Transportation Needs (12/26/2022)   PRAPARE - Administrator, Civil Service (Medical): No    Lack of Transportation (Non-Medical): No  Physical Activity: Not on file  Stress: Not on file  Social Connections: Not on file    Review of Systems: A 12 point ROS discussed and pertinent positives are indicated in the HPI above.  All other systems are negative.  Review of Systems  Constitutional:  Positive for activity change and appetite change. Negative for fever.  Respiratory:  Negative for cough and shortness of breath.   Cardiovascular:  Negative for chest pain.  Gastrointestinal:  Positive for abdominal pain and diarrhea.  Psychiatric/Behavioral:  Negative for behavioral problems and confusion.     Vital Signs: BP 138/72 (BP Location: Right Arm)   Pulse 60   Temp 98 F (36.7 C) (Oral)   Resp 17   Ht 5\' 2"  (1.575  m)   Wt 146 lb (66.2 kg)   SpO2 100%   BMI 26.70 kg/m   Advance Care Plan: The advanced care plan/surrogate decision maker was discussed at the time of visit and documented in the medical record.    Physical Exam Vitals reviewed.  HENT:     Mouth/Throat:     Mouth: Mucous membranes are moist.  Cardiovascular:     Rate and Rhythm: Normal rate and regular rhythm.  Pulmonary:     Effort: Pulmonary effort is normal.     Breath sounds: Normal breath sounds.  Abdominal:     Tenderness: There is abdominal tenderness.  Skin:    General: Skin is warm.  Neurological:     Mental Status: She is alert and oriented to person, place, and time.  Psychiatric:        Behavior: Behavior normal.     Imaging: CT ABDOMEN PELVIS W CONTRAST  Result Date: 12/26/2022 CLINICAL DATA:  Abdominal pain EXAM: CT ABDOMEN AND PELVIS WITH CONTRAST TECHNIQUE: Multidetector CT imaging of the abdomen and pelvis was performed using the standard protocol following bolus administration of intravenous contrast. RADIATION DOSE REDUCTION: This exam was performed according to the departmental dose-optimization program which includes automated exposure control, adjustment of the mA and/or kV according to patient size and/or use of iterative reconstruction technique. CONTRAST:  70mL OMNIPAQUE IOHEXOL 350 MG/ML SOLN COMPARISON:  04/03/2011 FINDINGS: Lower chest: No acute findings are seen in lower lung fields. Hepatobiliary: No focal abnormalities are seen in liver. There is no dilation of bile ducts. Gallbladder is unremarkable. Pancreas: There is mild prominence of pancreatic duct. No focal abnormalities are seen. Spleen: There are small calcified nodules, possibly granulomas in spleen. Adrenals/Urinary Tract: There is 4 cm nodule in right adrenal with no significant interval change. There is no hydronephrosis. There are no renal or ureteral stones. There is 11 mm smooth marginated fluid density lesion in the upper pole of right  kidney suggesting renal cyst. Stomach/Bowel: Stomach is unremarkable. Small bowel loops are not dilated. Appendix is not dilated. Multiple diverticula are seen in colon. There is wall thickening in the distal descending colon and in proximal sigmoid colon. There is 4.2 x 2.6 cm loculated structure in the left margin of junction of descending and sigmoid colon containing pockets of air and possibly stool. Possibility of contained perforation is not excluded. Perirectal soft tissues are unremarkable. Vascular/Lymphatic: Scattered arterial calcifications are seen. Reproductive: Uterus is not seen.  There are no adnexal masses. Other: There is no ascites or pneumoperitoneum. Small umbilical hernia containing fat is seen. Musculoskeletal: Degenerative changes are noted in lumbar spine, more so at L4-L5 level. IMPRESSION: Diverticulosis of colon. There is abnormal wall thickening and pericolic stranding at the junction of descending and sigmoid colon in left iliac fossa suggesting acute diverticulitis. There is 4.2 x 2.6 cm area of fluid and stool in the left margin of: In left iliac fossa. Possibility of contained perforation with  abscess should be considered. Short-term follow-up CT may be considered to re-evaluate this finding. There is no evidence of intestinal obstruction or pneumoperitoneum. There is no hydronephrosis. Appendix is not dilated. There is 4 cm nodule in right adrenal with no significant change suggesting possible adenoma. Other findings as described in the body of the report. Electronically Signed   By: Ernie Avena M.D.   On: 12/26/2022 16:46    Labs:  CBC: Recent Labs    12/26/22 1341 12/26/22 1346 12/27/22 0013  WBC 8.0  --  8.5  HGB 11.5* 12.2 11.2*  HCT 36.1 36.0 35.7*  PLT 272  --  258    COAGS: No results for input(s): "INR", "APTT" in the last 8760 hours.  BMP: Recent Labs    12/26/22 1341 12/26/22 1346 12/27/22 0013  NA 137 138 137  K 3.4* 3.5 3.6  CL 101 103  101  CO2 25  --  25  GLUCOSE 217* 217* 213*  BUN 35* 35* 27*  CALCIUM 9.2  --  9.1  CREATININE 1.25* 1.20* 1.22*  GFRNONAA 47*  --  48*    LIVER FUNCTION TESTS: Recent Labs    12/26/22 1341  BILITOT 0.4  AST 14*  ALT 15  ALKPHOS 79  PROT 7.6  ALBUMIN 3.2*    TUMOR MARKERS: No results for input(s): "AFPTM", "CEA", "CA199", "CHROMGRNA" in the last 8760 hours.  Assessment and Plan:  Scheduled for diverticular abscess drain placement Risks and benefits discussed with the patient including bleeding, infection, damage to adjacent structures, bowel perforation/fistula connection, and sepsis.  All of the patient's questions were answered, patient is agreeable to proceed. Consent signed and in chart.  Thank you for this interesting consult.  I greatly enjoyed meeting Jillian Montgomery and look forward to participating in their care.  A copy of this report was sent to the requesting provider on this date.  Electronically Signed: Robet Leu, PA-C 12/27/2022, 9:45 AM   I spent a total of 20 Minutes    in face to face in clinical consultation, greater than 50% of which was counseling/coordinating care for diverticular abscess drain

## 2022-12-27 NOTE — Sedation Documentation (Signed)
Post scan, Dr to pull drain positioning back

## 2022-12-27 NOTE — Hospital Course (Addendum)
Jillian Montgomery is a 67 y.o. female with medical history significant of DM2, HTN, CKD3. Currently being treated for diverticulitis with abscess conservatively with IR guided drain placement.  General surgery following.  Most likely patient will require surgery.

## 2022-12-27 NOTE — Procedures (Signed)
Vascular and Interventional Radiology Procedure Note  Patient: Jillian Montgomery DOB: [birth year] Medical Record Number: [id number] Note Date/Time: 12/27/22 10:44 AM   Performing Physician: Roanna Banning, MD Assistant(s): None  Diagnosis: Diverticular abscess  Procedure: DRAINAGE CATHETER PLACEMENT into LLQ PERICOLONIC ABSCESS  Anesthesia: Conscious Sedation Complications: None Estimated Blood Loss: Minimal Specimens: Sent for Gram Stain, Aerobe Culture, and Anerobe Culture  Findings:  Successful CT-guided placement of 12 F catheter into LLQ peri-colonic abscess.  Plan:  - Flush drain with 5 mL Normal Saline every 8 hours. - Follow up drain evaluation / sinogram in 10 day(s).  See detailed procedure note with images in PACS. The patient tolerated the procedure well without incident or complication and was returned to Floor Bed in stable condition.    Roanna Banning, MD V[facility] and Interventional [facility]   Pager. [phone] Clinic. [phone]

## 2022-12-27 NOTE — Progress Notes (Signed)
Living well with diabetes book requested from pharmacy and provided to patient at bedside.

## 2022-12-27 NOTE — Sedation Documentation (Signed)
5ml Tan purulent specimen removed

## 2022-12-27 NOTE — TOC Initial Note (Signed)
Transition of Care [facility]) - Initial/Assessment Note    Patient Details  Name: Jillian Montgomery MRN: [id number] Date of Birth: [birth year]  Transition of Care [facility]) CM/SW Contact:    Lawerance Sabal, RN Phone Number: 12/27/2022, 10:59 AM  Clinical Narrative:                  Ptient from home, lives alone. Admitted with abd pain diverticulitis with abscess / contained perf .  IR for drain today.  TOC will follow   Expected Discharge Plan: Home/Self Care Barriers to Discharge: Continued Medical Work up   Patient Goals and CMS Choice            Expected Discharge Plan and Services       Living arrangements for the past 2 months: Single Family Home                                      Prior Living Arrangements/Services Living arrangements for the past 2 months: Single Family Home Lives with:: Self                   Activities of Daily Living Home Assistive Devices/Equipment: None ADL Screening (condition at time of admission) Patient's cognitive ability adequate to safely complete daily activities?: Yes Is the patient deaf or have difficulty hearing?: Yes Does the patient have difficulty seeing, even when wearing glasses/contacts?: Yes Does the patient have difficulty concentrating, remembering, or making decisions?: No Patient able to express need for assistance with ADLs?: Yes Does the patient have difficulty dressing or bathing?: No Independently performs ADLs?: Yes (appropriate for developmental age) Communication: Independent Dressing (OT): Independent Grooming: Independent Feeding: Independent Bathing: Independent Toileting: Independent In/Out Bed: Independent Walks in Home: Independent Does the patient have difficulty walking or climbing stairs?: No Weakness of Legs: None Weakness of Arms/Hands: None  Permission Sought/Granted                  Emotional Assessment              Admission diagnosis:  Diverticulitis of large  intestine with abscess [K57.20] Patient Active Problem List   Diagnosis Date Noted   DM2 (diabetes mellitus, type 2) (HCC) 12/26/2022   CKD (chronic kidney disease) stage 3, GFR 30-59 ml/min (HCC) 12/26/2022   Diverticulitis of large intestine with abscess 12/26/2022   HTN (hypertension) 12/26/2022   PCP:  No primary care provider on file. Pharmacy:   [id number] [place], [place] - [address] [place] [address] Phone: [phone] Fax: [phone]     Social Determinants of Health (SDOH) Social History: SDOH Screenings   Food Insecurity: No Food Insecurity (12/26/2022)  Housing: Low Risk  (12/26/2022)  Transportation Needs: No Transportation Needs (12/26/2022)  Utilities: Not At Risk (12/26/2022)  Tobacco Use: Low Risk  (12/26/2022)   SDOH Interventions:     Readmission Risk Interventions     No data to display

## 2022-12-27 NOTE — Plan of Care (Signed)

## 2022-12-27 NOTE — Sedation Documentation (Signed)
12 fr drain to left lateral abdomen

## 2022-12-27 NOTE — Progress Notes (Signed)
[facility] Progress Note Patient: Jillian Montgomery NGE:[id number] DOB: Nov 24, 1955 DOA: 12/26/2022  DOS: the patient was seen and examined on 12/27/2022  Brief hospital course: Jillian Montgomery is a 67 y.o. female with medical history significant of DM2, HTN, CKD3.   Pt in to ED with c/o intermittent abd pain for past month. Assessment and Plan: Acute diverticulitis with abscess. General surgery consult for control recommended IR.  Drain placement which was placed in 6/12. Follow-up on cultures. Continue with antibiotic. Continue symptom control.  HTN. Blood pressure medications on hold.  CKD 3A. Renal function close to baseline. Monitor.  Type 2 diabetes mellitus, uncontrolled with hyperglycemia. Continue sliding scale as well as basal bolus regimen.   Subjective: Abdominal pain still present but improving.  No nausea no vomiting.  Physical Exam: General: in Mild distress, No Rash Cardiovascular: S1 and S2 Present, No Murmur Respiratory: Good respiratory effort, Bilateral Air entry present. No Crackles, No wheezes Abdomen: Bowel Sound present, mild diffuse tenderness Extremities: No edema Neuro: Alert and oriented x3, no new focal deficit  Data Reviewed: I have Reviewed nursing notes, Vitals, and Lab results. Since last encounter, pertinent lab results CBC and BMP   . I have ordered test including CBC and BMP  .   Disposition: Status is: Inpatient Remains inpatient appropriate because: IV antibiotics.  SCDs Start: 12/26/22 2004   Family Communication: No one at bedside Level of care: Med-Surg  Vitals:   12/27/22 1130 12/27/22 1136 12/27/22 1211 12/27/22 1620  BP: (!) 151/73 (!) 155/67 (!) 140/93 (!) 151/67  Pulse: 63 60 (!) 57 (!) 58  Resp: 19 (!) 27 15 17   Temp:   97.8 F (36.6 C) 98 F (36.7 C)  TempSrc:   Oral   SpO2: 98% 95% 97% 100%  Weight:      Height:         Author: Lynden Oxford, MD 12/27/2022 7:53 PM  Please look on [email or web address] to find  out who is on call.

## 2022-12-28 DIAGNOSIS — K5720 Diverticulitis of large intestine with perforation and abscess without bleeding: Secondary | ICD-10-CM | POA: Diagnosis not present

## 2022-12-28 LAB — CBC
HCT: 36.9 % (ref 36.0–46.0)
Hemoglobin: 11.8 g/dL — ABNORMAL LOW (ref 12.0–15.0)
MCH: 26.7 pg (ref 26.0–34.0)
MCHC: 32 g/dL (ref 30.0–36.0)
MCV: 83.5 fL (ref 80.0–100.0)
Platelets: 252 10*3/uL (ref 150–400)
RBC: 4.42 MIL/uL (ref 3.87–5.11)
RDW: 14.7 % (ref 11.5–15.5)
WBC: 12.5 10*3/uL — ABNORMAL HIGH (ref 4.0–10.5)
nRBC: 0 % (ref 0.0–0.2)

## 2022-12-28 LAB — AEROBIC/ANAEROBIC CULTURE W GRAM STAIN (SURGICAL/DEEP WOUND)

## 2022-12-28 LAB — BASIC METABOLIC PANEL
Anion gap: 9 (ref 5–15)
BUN: 15 mg/dL (ref 8–23)
CO2: 26 mmol/L (ref 22–32)
Calcium: 8.9 mg/dL (ref 8.9–10.3)
Chloride: 101 mmol/L (ref 98–111)
Creatinine, Ser: 1.17 mg/dL — ABNORMAL HIGH (ref 0.44–1.00)
GFR, Estimated: 51 mL/min — ABNORMAL LOW (ref >60)
Glucose, Bld: 158 mg/dL — ABNORMAL HIGH (ref 70–99)
Potassium: 3.6 mmol/L (ref 3.5–5.1)
Sodium: 136 mmol/L (ref 135–145)

## 2022-12-28 LAB — MAGNESIUM: Magnesium: 2.7 mg/dL — ABNORMAL HIGH (ref 1.7–2.4)

## 2022-12-28 LAB — GLUCOSE, CAPILLARY
Glucose-Capillary: 175 mg/dL — ABNORMAL HIGH (ref 70–99)
Glucose-Capillary: 152 mg/dL — ABNORMAL HIGH (ref 70–99)
Glucose-Capillary: 153 mg/dL — ABNORMAL HIGH (ref 70–99)
Glucose-Capillary: 206 mg/dL — ABNORMAL HIGH (ref 70–99)
Glucose-Capillary: 149 mg/dL — ABNORMAL HIGH (ref 70–99)
Glucose-Capillary: 149 mg/dL — ABNORMAL HIGH (ref 70–99)

## 2022-12-28 MED ORDER — ENOXAPARIN SODIUM 40 MG/0.4ML IJ SOSY
40.0000 mg | PREFILLED_SYRINGE | INTRAMUSCULAR | Status: DC
  Administered 2022-12-28 – 2023-01-03 (×6): 40 mg via SUBCUTANEOUS
  Filled 2022-12-28 (×6): qty 0.4

## 2022-12-28 NOTE — Progress Notes (Signed)
Referring Physician(s): DR Bedelia Person  Supervising Physician: Richarda Overlie  Patient Status:  [facility] - In-pt  Chief Complaint:  Diverticular abscess  Subjective:  IR procedure 6/12:  Procedure: DRAINAGE CATHETER PLACEMENT into LLQ PERICOLONIC ABSCESS   Up in bed Feels some better; drain site is sore Eating some   Allergies: Lisinopril and Metformin  Medications: Prior to Admission medications   Medication Sig Start Date End Date Taking? Authorizing Provider  atenolol-chlorthalidone (TENORETIC) 50-25 MG per tablet Take 1 tablet by mouth daily.   Yes [provider]  atorvastatin (LIPITOR) 20 MG tablet Take 20 mg by mouth daily. 09/05/17  Yes [provider]  insulin detemir (LEVEMIR) 100 UNIT/ML injection Inject 16 Units into the skin at bedtime. 03/26/14  Yes Rachael Fee, MD  losartan (COZAAR) 100 MG tablet Take 100 mg by mouth daily.   Yes [provider]     Vital Signs: BP (!) 143/65 (BP Location: Right Arm)   Pulse 77   Temp 98.2 F (36.8 C) (Oral)   Resp 17   Ht 5\' 2"  (1.575 m)   Wt 146 lb (66.2 kg)   SpO2 100%   BMI 26.70 kg/m   Physical Exam Skin:    Comments: Site of drain is clean and dry; NT no bleeding OP dark brown in bag Culture ABUNDANT GRAM NEGATIVE RODS     Imaging: CT GUIDED PERITONEAL/RETROPERITONEAL FLUID DRAIN BY PERC CATH  Result Date: 12/27/2022 INDICATION: [id number] Colonic diverticular abscess [id number] EXAM: CT-GUIDED DRAIN PLACEMENT INTO PERICOLONIC ABCESS COMPARISON:  CT AP, 12/26/2022 MEDICATIONS: The patient is currently admitted to the hospital and receiving intravenous antibiotics. The antibiotics were administered within an appropriate time frame prior to the initiation of the procedure. ANESTHESIA/SEDATION: Moderate (conscious) sedation was employed during this procedure. A total of Versed 2 mg and Fentanyl 100 mcg was administered intravenously. Moderate Sedation Time: 22 minutes. The patient's level of  consciousness and vital signs were monitored continuously by radiology nursing throughout the procedure under my direct supervision. CONTRAST:  None COMPLICATIONS: None immediate. PROCEDURE: RADIATION DOSE REDUCTION: This exam was performed according to the departmental dose-optimization program which includes automated exposure control, adjustment of the mA and/or kV according to patient size and/or use of iterative reconstruction technique. Informed written consent was obtained from the patient and/or patient's representative after a discussion of the risks, benefits and alternatives to treatment. The patient was placed supine on the CT gantry and a pre procedural CT was performed re-demonstrating the known abscess/fluid collection within the LEFT lower quadrant abdomen. The procedure was planned. A timeout was performed prior to the initiation of the procedure. The LEFT lower quadrant was prepped and draped in the usual sterile fashion. The overlying soft tissues were anesthetized with 1% lidocaine with epinephrine. Appropriate trajectory was planned with the use of a 22 gauge spinal needle. An 18 gauge trocar needle was advanced into the abscess/fluid collection and a short Amplatz super stiff wire was coiled within the collection. Appropriate positioning was confirmed with a limited CT scan. The tract was serially dilated allowing placement of a 12 Fr drainage catheter. Appropriate positioning was confirmed with a limited postprocedural CT scan. 5 mL of purulent fluid was aspirated. The tube was connected to a drainage bag and sutured in place. A dressing was placed. The patient tolerated the procedure well without immediate post procedural complication. IMPRESSION: Successful CT-guided placement of a 12 Fr percutaneous drainage catheter into the LEFT lower quadrant abscess with aspiration of 5 mL  mL of purulent fluid. Samples were sent to the laboratory as requested by the ordering clinical team. Roanna Banning,  MD [facility] Radiology Electronically Signed   By: Roanna Banning M.D.   On: 12/27/2022 16:45   CT ABDOMEN PELVIS W CONTRAST  Result Date: 12/26/2022 CLINICAL DATA:  Abdominal pain EXAM: CT ABDOMEN AND PELVIS WITH CONTRAST TECHNIQUE: Multidetector CT imaging of the abdomen and pelvis was performed using the standard protocol following bolus administration of intravenous contrast. RADIATION DOSE REDUCTION: This exam was performed according to the departmental dose-optimization program which includes automated exposure control, adjustment of the mA and/or kV according to patient size and/or use of iterative reconstruction technique. CONTRAST:  70mL OMNIPAQUE IOHEXOL 350 MG/ML SOLN COMPARISON:  04/03/2011 FINDINGS: Lower chest: No acute findings are seen in lower lung fields. Hepatobiliary: No focal abnormalities are seen in liver. There is no dilation of bile ducts. Gallbladder is unremarkable. Pancreas: There is mild prominence of pancreatic duct. No focal abnormalities are seen. Spleen: There are small calcified nodules, possibly granulomas in spleen. Adrenals/Urinary Tract: There is 4 cm nodule in right adrenal with no significant interval change. There is no hydronephrosis. There are no renal or ureteral stones. There is 11 mm smooth marginated fluid density lesion in the upper pole of right kidney suggesting renal cyst. Stomach/Bowel: Stomach is unremarkable. Small bowel loops are not dilated. Appendix is not dilated. Multiple diverticula are seen in colon. There is wall thickening in the distal descending colon and in proximal sigmoid colon. There is 4.2 x 2.6 cm loculated structure in the left margin of junction of descending and sigmoid colon containing pockets of air and possibly stool. Possibility of contained perforation is not excluded. Perirectal soft tissues are unremarkable. Vascular/Lymphatic: Scattered arterial calcifications are seen. Reproductive:  Uterus is not seen.  There are no adnexal masses. Other: There is no ascites or pneumoperitoneum. Small umbilical hernia containing fat is seen. Musculoskeletal: Degenerative changes are noted in lumbar spine, more so at L4-L5 level. IMPRESSION: Diverticulosis of colon. There is abnormal wall thickening and pericolic stranding at the junction of descending and sigmoid colon in left iliac fossa suggesting acute diverticulitis. There is 4.2 x 2.6 cm area of fluid and stool in the left margin of: In left iliac fossa. Possibility of contained perforation with abscess should be considered. Short-term follow-up CT may be considered to re-evaluate this finding. There is no evidence of intestinal obstruction or pneumoperitoneum. There is no hydronephrosis. Appendix is not dilated. There is 4 cm nodule in right adrenal with no significant change suggesting possible adenoma. Other findings as described in the body of the report. Electronically Signed   By: Ernie Avena M.D.   On: 12/26/2022 16:46    Labs:  CBC: Recent Labs    12/26/22 1341 12/26/22 1346 12/27/22 0013 12/28/22 0205  WBC 8.0  --  8.5 12.5*  HGB 11.5* 12.2 11.2* 11.8*  HCT 36.1 36.0 35.7* 36.9  PLT 272  --  258 252    COAGS: Recent Labs    12/27/22 1013  INR 1.0    BMP: Recent Labs    12/26/22 1341 12/26/22 1346 12/27/22 0013 12/28/22 0205  NA 137 138 137 136  K 3.4* 3.5 3.6 3.6  CL 101 103 101 101  CO2 25  --  25 26  GLUCOSE 217* 217* 213* 158*  BUN 35* 35* 27* 15  CALCIUM 9.2  --  9.1 8.9  CREATININE 1.25* 1.20* 1.22* 1.17*  GFRNONAA  47*  --  48* 51*    LIVER FUNCTION TESTS: Recent Labs    12/26/22 1341  BILITOT 0.4  AST 14*  ALT 15  ALKPHOS 79  PROT 7.6  ALBUMIN 3.2*    Drain Location: LLQ Size: Fr size: 12 Fr Date of placement: 12/27/22  Currently to: Drain collection device: gravity 24 hour output:  Output by Drain (mL) 12/26/22 0701 - 12/26/22 1900 12/26/22 1901 - 12/27/22 0700 12/27/22 0701  - 12/27/22 1900 12/27/22 1901 - 12/28/22 0700 12/28/22 0701 - 12/28/22 1243  Open Drain 1 Lateral;Left LLQ 12 Fr.    5     Interval imaging/drain manipulation:  none  Current examination: Flushes/aspirates easily.  Insertion site unremarkable. Suture and stat lock in place. Dressed appropriately.   Plan: Continue TID flushes with 5 cc NS. Record output Q shift. Dressing changes QD or PRN if soiled.  Call IR APP or on call IR MD if difficulty flushing or sudden change in drain output.  Repeat imaging/possible drain injection once output < 10 mL/QD (excluding flush material). Consideration for drain removal if output is < 10 mL/QD (excluding flush material), pending discussion with the providing surgical service.  Discharge planning: Please contact IR APP or on call IR MD prior to patient d/c to ensure appropriate follow up plans are in place. Typically patient will follow up with IR clinic 10-14 days post d/c for repeat imaging/possible drain injection. IR scheduler will contact patient with date/time of appointment. Patient will need to flush drain QD with 5 cc NS, record output QD, dressing changes every 2-3 days or earlier if soiled.   IR will continue to follow - please call with questions or concerns.   Electronically Signed: Robet Leu, PA-C 12/28/2022, 12:43 PM   I spent a total of 15 Minutes at the the patient's bedside AND on the patient's hospital floor or unit, greater than 50% of which was counseling/coordinating care for LLQ abscess drain

## 2022-12-28 NOTE — Progress Notes (Signed)
Subjective: CC: Patient s/p IR drain 6/12  Lower abdominal pain improved today. Felt hungry earlier and pain improved after fld. No n/v. No flatus or bm.   Last colonoscopy was > 10 years ago and she thinks just showed diverticulosis   No hx of IBD. Reports hx of Aunt with UC. Her grandmother who had colon cancer   Objective: Vital signs in last 24 hours: Temp:  [98 F (36.7 C)-99.3 F (37.4 C)] 98.2 F (36.8 C) (06/13 0813) Pulse Rate:  [58-80] 77 (06/13 0813) Resp:  [16-17] 17 (06/13 0813) BP: (138-158)/(65-72) 143/65 (06/13 0813) SpO2:  [98 %-100 %] 100 % (06/13 0813) Last BM Date : 12/26/22  Intake/Output from previous day: 06/12 0701 - 06/13 0700 In: 1221.9 [P.O.:1080; IV Piggyback:141.9] Out: 5 [Drains:5] Intake/Output this shift: Total I/O In: 340 [P.O.:340] Out: -   PE: Gen:  Alert, NAD, pleasant Abd: Soft, ND, very ttp in the LLQ, +BS. IR drain feculent appearing  Lab Results:  Recent Labs    12/27/22 0013 12/28/22 0205  WBC 8.5 12.5*  HGB 11.2* 11.8*  HCT 35.7* 36.9  PLT 258 252   BMET Recent Labs    12/27/22 0013 12/28/22 0205  NA 137 136  K 3.6 3.6  CL 101 101  CO2 25 26  GLUCOSE 213* 158*  BUN 27* 15  CREATININE 1.22* 1.17*  CALCIUM 9.1 8.9   PT/INR Recent Labs    12/27/22 1013  LABPROT 13.8  INR 1.0   CMP     Component Value Date/Time   NA 136 12/28/2022 0205   K 3.6 12/28/2022 0205   CL 101 12/28/2022 0205   CO2 26 12/28/2022 0205   GLUCOSE 158 (H) 12/28/2022 0205   BUN 15 12/28/2022 0205   CREATININE 1.17 (H) 12/28/2022 0205   CALCIUM 8.9 12/28/2022 0205   PROT 7.6 12/26/2022 1341   ALBUMIN 3.2 (L) 12/26/2022 1341   AST 14 (L) 12/26/2022 1341   ALT 15 12/26/2022 1341   ALKPHOS 79 12/26/2022 1341   BILITOT 0.4 12/26/2022 1341   GFRNONAA 51 (L) 12/28/2022 0205   GFRAA >60 04/03/2011 1745   Lipase     Component Value Date/Time   LIPASE 50 12/26/2022 1341    Studies/Results: CT GUIDED  PERITONEAL/RETROPERITONEAL FLUID DRAIN BY PERC CATH  Result Date: 12/27/2022 INDICATION: [id number] Colonic diverticular abscess [id number] EXAM: CT-GUIDED DRAIN PLACEMENT INTO PERICOLONIC ABCESS COMPARISON:  CT AP, 12/26/2022 MEDICATIONS: The patient is currently admitted to the hospital and receiving intravenous antibiotics. The antibiotics were administered within an appropriate time frame prior to the initiation of the procedure. ANESTHESIA/SEDATION: Moderate (conscious) sedation was employed during this procedure. A total of Versed 2 mg and Fentanyl 100 mcg was administered intravenously. Moderate Sedation Time: 22 minutes. The patient's level of consciousness and vital signs were monitored continuously by radiology nursing throughout the procedure under my direct supervision. CONTRAST:  None COMPLICATIONS: None immediate. PROCEDURE: RADIATION DOSE REDUCTION: This exam was performed according to the departmental dose-optimization program which includes automated exposure control, adjustment of the mA and/or kV according to patient size and/or use of iterative reconstruction technique. Informed written consent was obtained from the patient and/or patient's representative after a discussion of the risks, benefits and alternatives to treatment. The patient was placed supine on the CT gantry and a pre procedural CT was performed re-demonstrating the known abscess/fluid collection within the LEFT lower quadrant abdomen. The procedure was planned. A timeout was performed prior to the initiation  of the procedure. The LEFT lower quadrant was prepped and draped in the usual sterile fashion. The overlying soft tissues were anesthetized with 1% lidocaine with epinephrine. Appropriate trajectory was planned with the use of a 22 gauge spinal needle. An 18 gauge trocar needle was advanced into the abscess/fluid collection and a short Amplatz super stiff wire was coiled within the collection. Appropriate positioning was confirmed  with a limited CT scan. The tract was serially dilated allowing placement of a 12 Fr drainage catheter. Appropriate positioning was confirmed with a limited postprocedural CT scan. 5 mL of purulent fluid was aspirated. The tube was connected to a drainage bag and sutured in place. A dressing was placed. The patient tolerated the procedure well without immediate post procedural complication. IMPRESSION: Successful CT-guided placement of a 12 Fr percutaneous drainage catheter into the LEFT lower quadrant abscess with aspiration of 5 mL mL of purulent fluid. Samples were sent to the laboratory as requested by the ordering clinical team. Jillian Banning, MD [facility] Radiology Electronically Signed   By: Jillian Montgomery M.D.   On: 12/27/2022 16:45    Anti-infectives: Anti-infectives (From admission, onward)    Start     Dose/Rate Route Frequency Ordered Stop   12/27/22 0200  piperacillin-tazobactam (ZOSYN) IVPB 3.375 g       See Hyperspace for full Linked Orders Report.   3.375 g 12.5 mL/hr over 240 Minutes Intravenous Every 8 hours 12/26/22 1854     12/26/22 1900  piperacillin-tazobactam (ZOSYN) IVPB 3.375 g       See Hyperspace for full Linked Orders Report.   3.375 g 100 mL/hr over 30 Minutes Intravenous  Once 12/26/22 1854 12/26/22 2039        Assessment/Plan Descending and Sigmoid Diverticulitis with contained perforation  - S/p IR drain 6/12. Flushes per IR.  - Cont abx. IR drain cx pending - If patient fails to improve they may require repeating imaging, additional drain placement (or upsize), or surgical intervention resulting in a colectomy/colostomy. I am concerned that patients drain is feculent appearing, wbc up today and she still is very ttp in the LLQ. Will back down to CLD. We will make NPO at midnight and re-eval in the AM. I do not think she needs emergency surgery tonight. This was discussed with the patient. - If patient improves  with conservative therapies would recommend colonoscopy in ~4-6 weeks.   - We will follow with you  FEN - CLD, IVF per [facility] VTE - SCDs, Lovenox  ID - Zosyn   I reviewed nursing notes, Consultant (IR) notes, hospitalist notes, last 24 h vitals and pain scores, last 48 h intake and output, last 24 h labs and trends, and last 24 h imaging results.    LOS: 2 days    Jacinto Halim , [facility] 12/28/2022, 3:39 PM Please see Amion for pager number during day hours 7:00am-4:30pm

## 2022-12-28 NOTE — Inpatient Diabetes Management (Signed)
Inpatient Diabetes Program Recommendations  AACE/ADA: New Consensus Statement on Inpatient Glycemic Control (2015)  Target Ranges:  Prepandial:   less than 140 mg/dL      Peak postprandial:   less than 180 mg/dL (1-2 hours)      Critically ill patients:  140 - 180 mg/dL   Lab Results  Component Value Date   GLUCAP 153 (H) 12/28/2022   HGBA1C 10.9 (H) 12/26/2022    Review of Glycemic Control  Diabetes history: DM 2 Outpatient Diabetes medications: Levemir  16 units qhs Current orders for Inpatient glycemic control:  Levemir 10 units qhs Novolog 0-9 units Q4  A1c 10.9% on 6/11  Spoke with pt at bedside regarding A1c and glucose control at home. Pt reports not being able to take insulin or check her glucose all the time for the last month due to feeling unwell. Discussed current A1c. Reviewed glucose and A1c goals. Stressed the importance of glucose control on her current medical issues. Encouraged compliance with medication and glucose monitoring at home.   Regular soda noted at bedside. Pt reported talking to nursing staff about this and said regular sodas were still being brought to her room. Spoke with nursing staff about substituting diet beverages for the regular ones at bedside.   Thanks, Christena Deem RN, MSN, BC-ADM Inpatient Diabetes Coordinator Team Pager [phone] (8a-5p)

## 2022-12-28 NOTE — Progress Notes (Signed)
[facility] Progress Note Patient: Jillian Montgomery:[id number] DOB: 09/18/1955 DOA: 12/26/2022  DOS: the patient was seen and examined on 12/28/2022  Brief hospital course: AMRA SHUKLA is a 67 y.o. female with medical history significant of DM2, HTN, CKD3.   Pt in to ED with c/o intermittent abd pain for past month. Assessment and Plan: Acute diverticulitis with abscess. General surgery consulted.  Recommend IR guided drain placement which was completed on 6/12. Appreciate general surgery follow-up. Continue antibiotics. Continue symptom control. Currently on full liquid diet per patient request.  HTN. Blood pressure medications on hold.  CKD 3A. Renal function close to baseline. Monitor.  Type 2 diabetes mellitus, uncontrolled with hyperglycemia. Continue sliding scale as well as basal bolus regimen.   Subjective: Abdominal pain still present.  Reports some nausea as well.  Not passing gas.  No fever no chills.  Physical Exam: In moderate distress. No rash. S1-S2 present.  No murmur. Bowel sound present.  Diffuse abdominal tenderness present. Trace edema. Alert awake and oriented.  Data Reviewed: I have Reviewed nursing notes, Vitals, and Lab results. Since last encounter, pertinent lab results CBC and BMP   . I have ordered test including CBC and BMP  . I have discussed pt's care plan and test results with general surgery  .    Disposition: Status is: Inpatient Remains inpatient appropriate because: IV antibiotics.  enoxaparin (LOVENOX) injection 40 mg Start: 12/28/22 2200 SCDs Start: 12/26/22 2004   Family Communication: No one at bedside Level of care: Med-Surg  Vitals:   12/27/22 2034 12/28/22 0433 12/28/22 0813 12/28/22 1628  BP: (!) 158/70 138/72 (!) 143/65 (!) 143/68  Pulse: 73 80 77 88  Resp: 16 16 17 16   Temp: 98.4 F (36.9 C) 99.3 F (37.4 C) 98.2 F (36.8 C) 100.1 F (37.8 C)  TempSrc: Oral Oral Oral Oral  SpO2: 99% 98% 100% 99%   Weight:      Height:         Author: Lynden Oxford, MD 12/28/2022 7:07 PM  Please look on [email or web address] to find out who is on call.

## 2022-12-29 ENCOUNTER — Other Ambulatory Visit: Payer: Self-pay | Admitting: Radiology

## 2022-12-29 DIAGNOSIS — K5720 Diverticulitis of large intestine with perforation and abscess without bleeding: Secondary | ICD-10-CM

## 2022-12-29 LAB — GLUCOSE, CAPILLARY
Glucose-Capillary: 136 mg/dL — ABNORMAL HIGH (ref 70–99)
Glucose-Capillary: 146 mg/dL — ABNORMAL HIGH (ref 70–99)
Glucose-Capillary: 150 mg/dL — ABNORMAL HIGH (ref 70–99)
Glucose-Capillary: 179 mg/dL — ABNORMAL HIGH (ref 70–99)
Glucose-Capillary: 193 mg/dL — ABNORMAL HIGH (ref 70–99)
Glucose-Capillary: 205 mg/dL — ABNORMAL HIGH (ref 70–99)

## 2022-12-29 LAB — BASIC METABOLIC PANEL
Anion gap: 13 (ref 5–15)
BUN: 17 mg/dL (ref 8–23)
CO2: 24 mmol/L (ref 22–32)
Calcium: 8.7 mg/dL — ABNORMAL LOW (ref 8.9–10.3)
Chloride: 97 mmol/L — ABNORMAL LOW (ref 98–111)
Creatinine, Ser: 1.43 mg/dL — ABNORMAL HIGH (ref 0.44–1.00)
GFR, Estimated: 40 mL/min — ABNORMAL LOW (ref >60)
Glucose, Bld: 213 mg/dL — ABNORMAL HIGH (ref 70–99)
Potassium: 3.5 mmol/L (ref 3.5–5.1)
Sodium: 134 mmol/L — ABNORMAL LOW (ref 135–145)

## 2022-12-29 LAB — CBC
HCT: 34.9 % — ABNORMAL LOW (ref 36.0–46.0)
Hemoglobin: 11.2 g/dL — ABNORMAL LOW (ref 12.0–15.0)
MCH: 26.7 pg (ref 26.0–34.0)
MCHC: 32.1 g/dL (ref 30.0–36.0)
MCV: 83.3 fL (ref 80.0–100.0)
Platelets: 263 10*3/uL (ref 150–400)
RBC: 4.19 MIL/uL (ref 3.87–5.11)
RDW: 14.7 % (ref 11.5–15.5)
WBC: 12.8 10*3/uL — ABNORMAL HIGH (ref 4.0–10.5)
nRBC: 0 % (ref 0.0–0.2)

## 2022-12-29 LAB — MAGNESIUM: Magnesium: 1.9 mg/dL (ref 1.7–2.4)

## 2022-12-29 LAB — AEROBIC/ANAEROBIC CULTURE W GRAM STAIN (SURGICAL/DEEP WOUND)

## 2022-12-29 MED ORDER — LACTATED RINGERS IV SOLN: INTRAVENOUS | Status: DC

## 2022-12-29 NOTE — Progress Notes (Signed)
Pharmacy Antibiotic Note  Jillian Montgomery is a 67 y.o. female admitted on 12/26/2022 with  intra-abdominal infection . Patient presents with diverticulitis and possible contained perforation s/p drain placement by IR on 6/12. Pharmacy has been consulted for zosyn dosing.  CrCl 30-40 mL/min  Plan: Continue Zosyn 3.375g Q8H Monitor renal function, WBC, fever curve F/u cultures for ability to de-escalate  Height: 5\' 2"  (157.5 cm) Weight: 66.2 kg (146 lb) IBW/kg (Calculated) : 50.1  Temp (24hrs), Avg:99.1 F (37.3 C), Min:98.5 F (36.9 C), Max:100.1 F (37.8 C)  Recent Labs  Lab 12/26/22 1341 12/26/22 1346 12/27/22 0013 12/28/22 0205 12/29/22 0042  WBC 8.0  --  8.5 12.5* 12.8*  CREATININE 1.25* 1.20* 1.22* 1.17* 1.43*     Estimated Creatinine Clearance: 33.6 mL/min (A) (by C-G formula based on SCr of 1.43 mg/dL (H)).    Allergies  Allergen Reactions   Lisinopril Swelling    Pt stated, "lips swell up"   Metformin Diarrhea    Antimicrobials this admission: Zosyn 6/11 >>   Thank you for allowing pharmacy to be a part of this patient's care.  Eldridge Scot, PharmD Clinical Pharmacist 12/29/2022 8:15 AM

## 2022-12-29 NOTE — Plan of Care (Signed)
  Problem: Coping: Goal: Ability to adjust to condition or change in health will improve Outcome: Progressing   Problem: Fluid Volume: Goal: Ability to maintain a balanced intake and output will improve Outcome: Progressing   Problem: Metabolic: Goal: Ability to maintain appropriate glucose levels will improve Outcome: Progressing   Problem: Nutritional: Goal: Maintenance of adequate nutrition will improve Outcome: Progressing Goal: Progress toward achieving an optimal weight will improve Outcome: Progressing   Problem: Skin Integrity: Goal: Risk for impaired skin integrity will decrease Outcome: Progressing   Problem: Tissue Perfusion: Goal: Adequacy of tissue perfusion will improve Outcome: Progressing

## 2022-12-29 NOTE — Care Management Important Message (Signed)
Important Message  Patient Details  Name: Jillian Montgomery MRN: [id number] Date of Birth: Nov 20, 1955   Medicare Important Message Given:  Yes     Sherilyn Banker 12/29/2022, 4:29 PM

## 2022-12-29 NOTE — Plan of Care (Signed)

## 2022-12-29 NOTE — Progress Notes (Signed)
Referring Physician(s): Dr. Bedelia Person  Supervising Physician: Gilmer Mor  Patient Status:  [facility] - In-pt  Chief Complaint:  LLQ abscess. Sigmoid diverticulitis with contained perforation   Subjective:  Family member present at bedside. Patient states that she is feeling better.   Allergies: Lisinopril and Metformin  Medications: Prior to Admission medications   Medication Sig Start Date End Date Taking? Authorizing Provider  atenolol-chlorthalidone (TENORETIC) 50-25 MG per tablet Take 1 tablet by mouth daily.   Yes [provider]  atorvastatin (LIPITOR) 20 MG tablet Take 20 mg by mouth daily. 09/05/17  Yes [provider]  insulin detemir (LEVEMIR) 100 UNIT/ML injection Inject 16 Units into the skin at bedtime. 03/26/14  Yes Rachael Fee, MD  losartan (COZAAR) 100 MG tablet Take 100 mg by mouth daily.   Yes [provider]     Vital Signs: BP 131/62 (BP Location: Right Arm)   Pulse 76   Temp 98.7 F (37.1 C) (Oral)   Resp 18   Ht 5\' 2"  (1.575 m)   Wt 146 lb (66.2 kg)   SpO2 98%   BMI 26.70 kg/m   Physical Exam Vitals and nursing note reviewed.  Constitutional:      Appearance: She is well-developed.  HENT:     Head: Normocephalic and atraumatic.  Eyes:     Conjunctiva/sclera: Conjunctivae normal.  Pulmonary:     Effort: Pulmonary effort is normal.  Abdominal:     Comments: Positive LLQ  drain to gravity bag. Site is unremarkable with no erythema, edema, tenderness, bleeding or drainage noted at exit site. Stat lock in place. 10 ml of  feculent malodorous colored fluid and air noted in gravity bag. Drain is able to be flushed easily. No leakage or pain with flushing.  Insertion site clean and dry.      Musculoskeletal:        General: Normal range of motion.     Cervical back: Normal range of motion.  Skin:    General: Skin is warm.  Neurological:     Mental Status: She is alert and oriented to person, place, and time.      Imaging: CT GUIDED PERITONEAL/RETROPERITONEAL FLUID DRAIN BY [facility] CATH  Result Date: 12/27/2022 INDICATION: [id number] Colonic diverticular abscess [id number] EXAM: CT-GUIDED DRAIN PLACEMENT INTO PERICOLONIC ABCESS COMPARISON:  CT AP, 12/26/2022 MEDICATIONS: The patient is currently admitted to the hospital and receiving intravenous antibiotics. The antibiotics were administered within an appropriate time frame prior to the initiation of the procedure. ANESTHESIA/SEDATION: Moderate (conscious) sedation was employed during this procedure. A total of Versed 2 mg and Fentanyl 100 mcg was administered intravenously. Moderate Sedation Time: 22 minutes. The patient's level of consciousness and vital signs were monitored continuously by radiology nursing throughout the procedure under my direct supervision. CONTRAST:  None COMPLICATIONS: None immediate. PROCEDURE: RADIATION DOSE REDUCTION: This exam was performed according to the departmental dose-optimization program which includes automated exposure control, adjustment of the mA and/or kV according to patient size and/or use of iterative reconstruction technique. Informed written consent was obtained from the patient and/or patient's representative after a discussion of the risks, benefits and alternatives to treatment. The patient was placed supine on the CT gantry and a pre procedural CT was performed re-demonstrating the known abscess/fluid collection within the LEFT lower quadrant abdomen. The procedure was planned. A timeout was performed prior to the initiation of the procedure. The LEFT lower quadrant was prepped and draped in the usual sterile fashion. The overlying  soft tissues were anesthetized with 1% lidocaine with epinephrine. Appropriate trajectory was planned with the use of a 22 gauge spinal needle. An 18 gauge trocar needle was advanced into the abscess/fluid collection and a short Amplatz super stiff wire was coiled within the collection.  Appropriate positioning was confirmed with a limited CT scan. The tract was serially dilated allowing placement of a 12 Fr drainage catheter. Appropriate positioning was confirmed with a limited postprocedural CT scan. 5 mL of purulent fluid was aspirated. The tube was connected to a drainage bag and sutured in place. A dressing was placed. The patient tolerated the procedure well without immediate post procedural complication. IMPRESSION: Successful CT-guided placement of a 12 Fr percutaneous drainage catheter into the LEFT lower quadrant abscess with aspiration of 5 mL mL of purulent fluid. Samples were sent to the laboratory as requested by the ordering clinical team. Roanna Banning, MD [facility] Radiology Electronically Signed   By: Roanna Banning M.D.   On: 12/27/2022 16:45   CT ABDOMEN PELVIS W CONTRAST  Result Date: 12/26/2022 CLINICAL DATA:  Abdominal pain EXAM: CT ABDOMEN AND PELVIS WITH CONTRAST TECHNIQUE: Multidetector CT imaging of the abdomen and pelvis was performed using the standard protocol following bolus administration of intravenous contrast. RADIATION DOSE REDUCTION: This exam was performed according to the departmental dose-optimization program which includes automated exposure control, adjustment of the mA and/or kV according to patient size and/or use of iterative reconstruction technique. CONTRAST:  70mL OMNIPAQUE IOHEXOL 350 MG/ML SOLN COMPARISON:  04/03/2011 FINDINGS: Lower chest: No acute findings are seen in lower lung fields. Hepatobiliary: No focal abnormalities are seen in liver. There is no dilation of bile ducts. Gallbladder is unremarkable. Pancreas: There is mild prominence of pancreatic duct. No focal abnormalities are seen. Spleen: There are small calcified nodules, possibly granulomas in spleen. Adrenals/Urinary Tract: There is 4 cm nodule in right adrenal with no significant interval change. There is no hydronephrosis. There  are no renal or ureteral stones. There is 11 mm smooth marginated fluid density lesion in the upper pole of right kidney suggesting renal cyst. Stomach/Bowel: Stomach is unremarkable. Small bowel loops are not dilated. Appendix is not dilated. Multiple diverticula are seen in colon. There is wall thickening in the distal descending colon and in proximal sigmoid colon. There is 4.2 x 2.6 cm loculated structure in the left margin of junction of descending and sigmoid colon containing pockets of air and possibly stool. Possibility of contained perforation is not excluded. Perirectal soft tissues are unremarkable. Vascular/Lymphatic: Scattered arterial calcifications are seen. Reproductive: Uterus is not seen.  There are no adnexal masses. Other: There is no ascites or pneumoperitoneum. Small umbilical hernia containing fat is seen. Musculoskeletal: Degenerative changes are noted in lumbar spine, more so at L4-L5 level. IMPRESSION: Diverticulosis of colon. There is abnormal wall thickening and pericolic stranding at the junction of descending and sigmoid colon in left iliac fossa suggesting acute diverticulitis. There is 4.2 x 2.6 cm area of fluid and stool in the left margin of: In left iliac fossa. Possibility of contained perforation with abscess should be considered. Short-term follow-up CT may be considered to re-evaluate this finding. There is no evidence of intestinal obstruction or pneumoperitoneum. There is no hydronephrosis. Appendix is not dilated. There is 4 cm nodule in right adrenal with no significant change suggesting possible adenoma. Other findings as described in the body of the report. Electronically Signed   By: Ernie Avena M.D.   On: 12/26/2022  16:46    Labs:  CBC: Recent Labs    12/26/22 1341 12/26/22 1346 12/27/22 0013 12/28/22 0205 12/29/22 0042  WBC 8.0  --  8.5 12.5* 12.8*  HGB 11.5* 12.2 11.2* 11.8* 11.2*  HCT 36.1 36.0 35.7* 36.9 34.9*  PLT 272  --  258 252 263     COAGS: Recent Labs    12/27/22 1013  INR 1.0    BMP: Recent Labs    12/26/22 1341 12/26/22 1346 12/27/22 0013 12/28/22 0205 12/29/22 0042  NA 137 138 137 136 134*  K 3.4* 3.5 3.6 3.6 3.5  CL 101 103 101 101 97*  CO2 25  --  25 26 24   GLUCOSE 217* 217* 213* 158* 213*  BUN 35* 35* 27* 15 17  CALCIUM 9.2  --  9.1 8.9 8.7*  CREATININE 1.25* 1.20* 1.22* 1.17* 1.43*  GFRNONAA 47*  --  48* 51* 40*    LIVER FUNCTION TESTS: Recent Labs    12/26/22 1341  BILITOT 0.4  AST 14*  ALT 15  ALKPHOS 79  PROT 7.6  ALBUMIN 3.2*    Assessment and Plan:  67 y.o. female inpatient. History of descending and sigmoid diverticulitis with contained perforation s/p IR placed LLQ abscess drain  on 6.12.24. per surgical noted dated 6.14.24  If patient fails to improve they may require repeating imaging, additional drain placement (or upsize), or surgical intervention resulting in a colectomy/colostomy. 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  5  5   Cultures grew E.coli. WBC WNL. Feculent malodorous output and air noted to be in the gravity bag.   Interval imaging/drain manipulation:  None since drain placement  Current examination: Flushes/aspirates easily.  Insertion site unremarkable. Suture and stat lock in place.  Plan: Continue TID flushes with 5 cc NS. Record output Q shift. Dressing changes QD or PRN if soiled.  Call IR APP or on call IR MD if difficulty flushing or sudden change in drain output.  Repeat imaging/possible drain injection once output < 10 mL/QD (excluding flush material). Consideration for drain  removal if output is < 10 mL/QD (excluding flush material), pending discussion with the providing surgical service.  Discharge planning: Please contact IR APP or on call IR MD prior to patient d/c to ensure appropriate follow up plans are in place. Typically patient will follow up with IR clinic 10-14 days post d/c for repeat imaging/possible drain injection. IR scheduler will contact patient with date/time of appointment. Patient will need to flush drain QD with 5 cc NS, record output QD, dressing changes every 2-3 days or earlier if soiled.   IR will continue to follow - please call with questions or concerns.    Electronically Signed: Alene Mires, NP 12/29/2022, 12:05 PM   I spent a total of 15 Minutes at the patient's bedside AND on the patient's hospital floor or unit, greater than 50% of which was counseling/coordinating care for LLQ abscess drain

## 2022-12-29 NOTE — Progress Notes (Signed)
Subjective: Pt still with feculent output in drain. Reports slight improvement in pain however and is passing more flatus. Denies nausea or vomiting.    Objective: Vital signs in last 24 hours: Temp:  [98.5 F (36.9 C)-100.1 F (37.8 C)] 98.7 F (37.1 C) (06/14 0824) Pulse Rate:  [76-88] 76 (06/14 0824) Resp:  [16-18] 18 (06/14 0824) BP: (128-143)/(62-68) 131/62 (06/14 0824) SpO2:  [96 %-99 %] 98 % (06/14 0824) Last BM Date : 12/26/22  Intake/Output from previous day: 06/13 0701 - 06/14 0700 In: 340 [P.O.:340] Out: 5 [Drains:5] Intake/Output this shift: No intake/output data recorded.  PE: Gen:  Alert, NAD, pleasant Abd: Soft, ND, ttp in the LLQ, +BS. IR drain feculent appearing with air present as well  Lab Results:  Recent Labs    12/28/22 0205 12/29/22 0042  WBC 12.5* 12.8*  HGB 11.8* 11.2*  HCT 36.9 34.9*  PLT 252 263    BMET Recent Labs    12/28/22 0205 12/29/22 0042  NA 136 134*  K 3.6 3.5  CL 101 97*  CO2 26 24  GLUCOSE 158* 213*  BUN 15 17  CREATININE 1.17* 1.43*  CALCIUM 8.9 8.7*    PT/INR Recent Labs    12/27/22 1013  LABPROT 13.8  INR 1.0    CMP     Component Value Date/Time   NA 134 (L) 12/29/2022 0042   K 3.5 12/29/2022 0042   CL 97 (L) 12/29/2022 0042   CO2 24 12/29/2022 0042   GLUCOSE 213 (H) 12/29/2022 0042   BUN 17 12/29/2022 0042   CREATININE 1.43 (H) 12/29/2022 0042   CALCIUM 8.7 (L) 12/29/2022 0042   PROT 7.6 12/26/2022 1341   ALBUMIN 3.2 (L) 12/26/2022 1341   AST 14 (L) 12/26/2022 1341   ALT 15 12/26/2022 1341   ALKPHOS 79 12/26/2022 1341   BILITOT 0.4 12/26/2022 1341   GFRNONAA 40 (L) 12/29/2022 0042   GFRAA >60 04/03/2011 1745   Lipase     Component Value Date/Time   LIPASE 50 12/26/2022 1341    Studies/Results: CT GUIDED PERITONEAL/RETROPERITONEAL FLUID DRAIN BY [facility] CATH  Result Date: 12/27/2022 INDICATION: [id number] Colonic diverticular abscess [id number] EXAM: CT-GUIDED DRAIN PLACEMENT INTO  PERICOLONIC ABCESS COMPARISON:  CT AP, 12/26/2022 MEDICATIONS: The patient is currently admitted to the hospital and receiving intravenous antibiotics. The antibiotics were administered within an appropriate time frame prior to the initiation of the procedure. ANESTHESIA/SEDATION: Moderate (conscious) sedation was employed during this procedure. A total of Versed 2 mg and Fentanyl 100 mcg was administered intravenously. Moderate Sedation Time: 22 minutes. The patient's level of consciousness and vital signs were monitored continuously by radiology nursing throughout the procedure under my direct supervision. CONTRAST:  None COMPLICATIONS: None immediate. PROCEDURE: RADIATION DOSE REDUCTION: This exam was performed according to the departmental dose-optimization program which includes automated exposure control, adjustment of the mA and/or kV according to patient size and/or use of iterative reconstruction technique. Informed written consent was obtained from the patient and/or patient's representative after a discussion of the risks, benefits and alternatives to treatment. The patient was placed supine on the CT gantry and a pre procedural CT was performed re-demonstrating the known abscess/fluid collection within the LEFT lower quadrant abdomen. The procedure was planned. A timeout was performed prior to the initiation of the procedure. The LEFT lower quadrant was prepped and draped in the usual sterile fashion. The overlying soft tissues were anesthetized with 1% lidocaine with epinephrine. Appropriate trajectory was planned with  the use of a 22 gauge spinal needle. An 18 gauge trocar needle was advanced into the abscess/fluid collection and a short Amplatz super stiff wire was coiled within the collection. Appropriate positioning was confirmed with a limited CT scan. The tract was serially dilated allowing placement of a 12 Fr drainage catheter. Appropriate positioning was confirmed with a limited postprocedural  CT scan. 5 mL of purulent fluid was aspirated. The tube was connected to a drainage bag and sutured in place. A dressing was placed. The patient tolerated the procedure well without immediate post procedural complication. IMPRESSION: Successful CT-guided placement of a 12 Fr percutaneous drainage catheter into the LEFT lower quadrant abscess with aspiration of 5 mL mL of purulent fluid. Samples were sent to the laboratory as requested by the ordering clinical team. Roanna Banning, MD [facility] Radiology Electronically Signed   By: Roanna Banning M.D.   On: 12/27/2022 16:45    Anti-infectives: Anti-infectives (From admission, onward)    Start     Dose/Rate Route Frequency Ordered Stop   12/27/22 0200  piperacillin-tazobactam (ZOSYN) IVPB 3.375 g       See Hyperspace for full Linked Orders Report.   3.375 g 12.5 mL/hr over 240 Minutes Intravenous Every 8 hours 12/26/22 1854     12/26/22 1900  piperacillin-tazobactam (ZOSYN) IVPB 3.375 g       See Hyperspace for full Linked Orders Report.   3.375 g 100 mL/hr over 30 Minutes Intravenous  Once 12/26/22 1854 12/26/22 2039        Assessment/Plan Descending and Sigmoid Diverticulitis with contained perforation  - S/p IR drain 6/12. Flushes per IR.  - Cont abx. IR drain cx with E.coli resistant to ampicillin and bactrim - agree with changing abx to rocephin/flagyl  - WBC stable at 12.8, HD stable, Tmax 100.1 in last 24h - If patient fails to improve they may require repeating imaging, additional drain placement (or upsize), or surgical intervention resulting in a colectomy/colostomy. 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  - ok to resume CLD but would not advance past this today  - If patient improves with conservative therapies would recommend colonoscopy in ~4-6 weeks.   - We will follow with you  FEN - CLD, IVF per [facility] VTE - SCDs, Lovenox  ID - Zosyn 6/11>6/14; rocephin/flagyl  6/14>>   I reviewed hospitalist notes, last 24 h vitals and pain scores, last 48 h intake and output, and last 24 h labs and trends.    LOS: 3 days    Juliet Rude , [facility] 12/29/2022, 10:07 AM Please see Amion for pager number during day hours 7:00am-4:30pm

## 2022-12-29 NOTE — Progress Notes (Signed)
[facility] Progress Note Patient: Jillian Montgomery ZOX:[id number] DOB: September 16, 1955 DOA: 12/26/2022  DOS: the patient was seen and examined on 12/29/2022  Brief hospital course: SHANDREA AHLMAN is a 67 y.o. female with medical history significant of DM2, HTN, CKD3.   Pt in to ED with c/o intermittent abd pain for past month. Assessment and Plan: Acute diverticulitis with abscess. General surgery consulted.  Recommend IR guided drain placement which was completed on 6/12. Appreciate general surgery follow-up. Continue antibiotics.  Cultures growing multiple organisms.  Will discuss with ID with regards to antibiotic management. Continue symptom control. On clear liquid diet.  Diet management per surgery.  HTN. Blood pressure medications on hold.  CKD 3A. Renal function close to baseline. Monitor.  Type 2 diabetes mellitus, uncontrolled with hyperglycemia. Continue sliding scale as well as basal bolus regimen.   Subjective: Appears in better spirits today.  Abdominal pain better.  No nausea no vomiting.  Passing gas.  Physical Exam: In mild.  No rash. S1-S2 present.  Clear to auscultation.  Bowel sound present but sluggish. Improving abdominal tenderness located now in the left lower quadrant. Trace edema.  Data Reviewed: I have Reviewed nursing notes, Vitals, and Lab results. Reviewed CBC and BMP.  Reordered CBC and BMP.  Discussed with general surgery.  Disposition: Status is: Inpatient Remains inpatient appropriate because: IV antibiotics.  enoxaparin (LOVENOX) injection 40 mg Start: 12/28/22 2200 SCDs Start: 12/26/22 2004   Family Communication: Discussed with family. Level of care: Med-Surg  Vitals:   12/28/22 2008 12/29/22 0538 12/29/22 0824 12/29/22 1503  BP: 135/65 128/68 131/62 (!) 119/59  Pulse: 86 79 76 77  Resp: 16 16 18 18   Temp: 98.8 F (37.1 C) 98.5 F (36.9 C) 98.7 F (37.1 C) 98.4 F (36.9 C)  TempSrc: Oral Oral Oral Oral  SpO2: 96% 99% 98%  99%  Weight:      Height:         Author: Lynden Oxford, MD 12/29/2022 6:39 PM  Please look on [email or web address] to find out who is on call.

## 2022-12-30 DIAGNOSIS — K5720 Diverticulitis of large intestine with perforation and abscess without bleeding: Secondary | ICD-10-CM | POA: Diagnosis not present

## 2022-12-30 LAB — GLUCOSE, CAPILLARY
Glucose-Capillary: 105 mg/dL — ABNORMAL HIGH (ref 70–99)
Glucose-Capillary: 113 mg/dL — ABNORMAL HIGH (ref 70–99)
Glucose-Capillary: 129 mg/dL — ABNORMAL HIGH (ref 70–99)
Glucose-Capillary: 170 mg/dL — ABNORMAL HIGH (ref 70–99)
Glucose-Capillary: 83 mg/dL (ref 70–99)
Glucose-Capillary: 89 mg/dL (ref 70–99)

## 2022-12-30 LAB — CBC
HCT: 32.1 % — ABNORMAL LOW (ref 36.0–46.0)
Hemoglobin: 10.2 g/dL — ABNORMAL LOW (ref 12.0–15.0)
MCH: 26.6 pg (ref 26.0–34.0)
MCHC: 31.8 g/dL (ref 30.0–36.0)
MCV: 83.6 fL (ref 80.0–100.0)
Platelets: 234 10*3/uL (ref 150–400)
RBC: 3.84 MIL/uL — ABNORMAL LOW (ref 3.87–5.11)
RDW: 14.7 % (ref 11.5–15.5)
WBC: 11.1 10*3/uL — ABNORMAL HIGH (ref 4.0–10.5)
nRBC: 0 % (ref 0.0–0.2)

## 2022-12-30 LAB — BASIC METABOLIC PANEL
Anion gap: 14 (ref 5–15)
Anion gap: 10 (ref 5–15)
BUN: 19 mg/dL (ref 8–23)
BUN: 16 mg/dL (ref 8–23)
CO2: 25 mmol/L (ref 22–32)
CO2: 25 mmol/L (ref 22–32)
Calcium: 8.5 mg/dL — ABNORMAL LOW (ref 8.9–10.3)
Calcium: 8.6 mg/dL — ABNORMAL LOW (ref 8.9–10.3)
Chloride: 94 mmol/L — ABNORMAL LOW (ref 98–111)
Chloride: 99 mmol/L (ref 98–111)
Creatinine, Ser: 1.38 mg/dL — ABNORMAL HIGH (ref 0.44–1.00)
Creatinine, Ser: 1.36 mg/dL — ABNORMAL HIGH (ref 0.44–1.00)
GFR, Estimated: 42 mL/min — ABNORMAL LOW (ref >60)
GFR, Estimated: 42 mL/min — ABNORMAL LOW (ref >60)
Glucose, Bld: 159 mg/dL — ABNORMAL HIGH (ref 70–99)
Glucose, Bld: 93 mg/dL (ref 70–99)
Potassium: 2.9 mmol/L — ABNORMAL LOW (ref 3.5–5.1)
Potassium: 4.8 mmol/L (ref 3.5–5.1)
Sodium: 133 mmol/L — ABNORMAL LOW (ref 135–145)
Sodium: 134 mmol/L — ABNORMAL LOW (ref 135–145)

## 2022-12-30 LAB — MAGNESIUM: Magnesium: 2 mg/dL (ref 1.7–2.4)

## 2022-12-30 MED ORDER — POTASSIUM CHLORIDE 20 MEQ PO PACK
40.0000 meq | PACK | ORAL | Status: AC
  Administered 2022-12-30 (×3): 40 meq via ORAL
  Filled 2022-12-30 (×3): qty 2

## 2022-12-30 MED ORDER — POTASSIUM CHLORIDE CRYS ER 20 MEQ PO TBCR
40.0000 meq | EXTENDED_RELEASE_TABLET | ORAL | Status: AC
  Administered 2022-12-30 (×2): 40 meq via ORAL
  Filled 2022-12-30 (×2): qty 2

## 2022-12-30 NOTE — Progress Notes (Signed)
Subjective: CC:  NAEO. Reports pain and abdominal distention are improved. She endorses soreness around her drain. She is having flatus but denies BM. Denies nausea.   Objective: Vital signs in last 24 hours: Temp:  [97.9 F (36.6 C)-98.4 F (36.9 C)] 98 F (36.7 C) (06/15 0752) Pulse Rate:  [60-77] 70 (06/15 0752) Resp:  [18-19] 18 (06/15 0752) BP: (119-127)/(48-77) 127/77 (06/15 0752) SpO2:  [99 %-100 %] 100 % (06/15 0752) Last BM Date : 12/26/22  Intake/Output from previous day: 06/14 0701 - 06/15 0700 In: 1281.7 [P.O.:120; I.V.:965.4; IV Piggyback:196.3] Out: 5 [Drains:5] Intake/Output this shift: No intake/output data recorded.  PE: Gen:  Alert, NAD, pleasant Abd: Soft, ND, ttp in the LLQ with voluntary guarding (doesn't want me to press near her drain sire), +BS. IR drain to gravity with air and small amt liquid stool.  Lab Results:  Recent Labs    12/29/22 0042 12/30/22 0028  WBC 12.8* 11.1*  HGB 11.2* 10.2*  HCT 34.9* 32.1*  PLT 263 234   BMET Recent Labs    12/29/22 0042 12/30/22 0028  NA 134* 133*  K 3.5 2.9*  CL 97* 94*  CO2 24 25  GLUCOSE 213* 159*  BUN 17 19  CREATININE 1.43* 1.38*  CALCIUM 8.7* 8.5*   PT/INR Recent Labs    12/27/22 1013  LABPROT 13.8  INR 1.0   CMP     Component Value Date/Time   NA 133 (L) 12/30/2022 0028   K 2.9 (L) 12/30/2022 0028   CL 94 (L) 12/30/2022 0028   CO2 25 12/30/2022 0028   GLUCOSE 159 (H) 12/30/2022 0028   BUN 19 12/30/2022 0028   CREATININE 1.38 (H) 12/30/2022 0028   CALCIUM 8.5 (L) 12/30/2022 0028   PROT 7.6 12/26/2022 1341   ALBUMIN 3.2 (L) 12/26/2022 1341   AST 14 (L) 12/26/2022 1341   ALT 15 12/26/2022 1341   ALKPHOS 79 12/26/2022 1341   BILITOT 0.4 12/26/2022 1341   GFRNONAA 42 (L) 12/30/2022 0028   GFRAA >60 04/03/2011 1745   Lipase     Component Value Date/Time   LIPASE 50 12/26/2022 1341    Studies/Results: No results found.  Anti-infectives: Anti-infectives (From  admission, onward)    Start     Dose/Rate Route Frequency Ordered Stop   12/27/22 0200  piperacillin-tazobactam (ZOSYN) IVPB 3.375 g       See Hyperspace for full Linked Orders Report.   3.375 g 12.5 mL/hr over 240 Minutes Intravenous Every 8 hours 12/26/22 1854     12/26/22 1900  piperacillin-tazobactam (ZOSYN) IVPB 3.375 g       See Hyperspace for full Linked Orders Report.   3.375 g 100 mL/hr over 30 Minutes Intravenous  Once 12/26/22 1854 12/26/22 2039        Assessment/Plan Descending and Sigmoid Diverticulitis with contained perforation  - S/p IR drain 6/12. Flushes per IR. 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 Cultures w/ E.coli resistant to ampicillin/sulbactam and Bactrim. - no emergent surgical needs today. Pain improving. WBC downtrending. Appears to have source control with drain right now. - If patient fails to improve she may require repeating imaging, additional drain placement (or upsize), or surgical intervention resulting in a colectomy/colostomy. - If patient improves with conservative therapies would recommend colonoscopy in ~4-8 weeks depending on clinical course/fistula.   - We will follow with you  At the patients request I spoke to her sister over the phone and updated  her on the plan of care and answered her questions.   FEN - FLD, IVF per [facility] VTE - SCDs, Lovenox  ID - Zosyn   I reviewed nursing notes, Consultant (IR) notes, hospitalist notes, last 24 h vitals and pain scores, last 48 h intake and output, last 24 h labs and trends, and last 24 h imaging results.    LOS: 4 days    Adam Phenix , [facility] 12/30/2022, 8:57 AM Please see Amion for pager number during day hours 7:00am-4:30pm

## 2022-12-30 NOTE — Progress Notes (Signed)
[facility] Progress Note Patient: Jillian Montgomery VZD:[id number] DOB: Sep 05, 1955 DOA: 12/26/2022  DOS: the patient was seen and examined on 12/30/2022  Brief hospital course: ILO HARTE is a 67 y.o. female with medical history significant of DM2, HTN, CKD3. Currently being treated for diverticulitis with abscess conservatively with IR guided drain placement.  General surgery following.  Most likely patient will require surgery. Assessment and Plan: Acute diverticulitis with abscess. General surgery consulted.  Recommend IR guided drain placement which was completed on 6/12. Appreciate general surgery follow-up. Continue antibiotics.  Cultures growing multiple organisms.  Continue current antibiotic. Continue symptom control. On clear liquid diet.  Diet management per surgery.  HTN. Blood pressure medications on hold.  CKD 3A. Renal function close to baseline. Monitor.  Type 2 diabetes mellitus, uncontrolled with hyperglycemia. Continue sliding scale as well as basal bolus regimen.   Subjective: Passing gas through the drain!  No nausea or vomiting.  Has more abdominal pain today compared to yesterday.  No bowel movement so far.  Physical Exam: In moderate distress. No rash. Clear to auscultation. Bowel sound present. Sluggish.  Patient did not allow examination with palpation.  Data Reviewed: I have Reviewed nursing notes, Vitals, and Lab results. Reviewed CBC and BMP.  Reviewed CBC and BMP.  Disposition: Status is: Inpatient Remains inpatient appropriate because: IV antibiotics.  enoxaparin (LOVENOX) injection 40 mg Start: 12/28/22 2200 SCDs Start: 12/26/22 2004   Family Communication: Discussed with family. Level of care: Med-Surg  Vitals:   12/29/22 2013 12/30/22 0358 12/30/22 0752 12/30/22 1605  BP: (!) 125/59 (!) 120/48 127/77 135/76  Pulse: 67 60 70 81  Resp: 19 18 18 18   Temp: 98.2 F (36.8 C) 97.9 F (36.6 C) 98 F (36.7 C) 98 F (36.7 C)   TempSrc: Oral Oral Oral Oral  SpO2: 100% 100% 100% 99%  Weight:      Height:         Author: Lynden Oxford, MD 12/30/2022 5:27 PM  Please look on [email or web address] to find out who is on call.

## 2022-12-31 DIAGNOSIS — K5720 Diverticulitis of large intestine with perforation and abscess without bleeding: Secondary | ICD-10-CM | POA: Diagnosis not present

## 2022-12-31 LAB — CBC
HCT: 33.3 % — ABNORMAL LOW (ref 36.0–46.0)
Hemoglobin: 10.4 g/dL — ABNORMAL LOW (ref 12.0–15.0)
MCH: 26.5 pg (ref 26.0–34.0)
MCHC: 31.2 g/dL (ref 30.0–36.0)
MCV: 84.9 fL (ref 80.0–100.0)
Platelets: 259 10*3/uL (ref 150–400)
RBC: 3.92 MIL/uL (ref 3.87–5.11)
RDW: 14.7 % (ref 11.5–15.5)
WBC: 10.8 10*3/uL — ABNORMAL HIGH (ref 4.0–10.5)
nRBC: 0 % (ref 0.0–0.2)

## 2022-12-31 LAB — AEROBIC/ANAEROBIC CULTURE W GRAM STAIN (SURGICAL/DEEP WOUND)

## 2022-12-31 LAB — GLUCOSE, CAPILLARY
Glucose-Capillary: 86 mg/dL (ref 70–99)
Glucose-Capillary: 239 mg/dL — ABNORMAL HIGH (ref 70–99)
Glucose-Capillary: 155 mg/dL — ABNORMAL HIGH (ref 70–99)
Glucose-Capillary: 146 mg/dL — ABNORMAL HIGH (ref 70–99)
Glucose-Capillary: 181 mg/dL — ABNORMAL HIGH (ref 70–99)

## 2022-12-31 MED ORDER — POLYETHYLENE GLYCOL 3350 17 G PO PACK
17.0000 g | PACK | Freq: Every day | ORAL | Status: DC
  Administered 2022-12-31: 17 g via ORAL
  Filled 2022-12-31 (×4): qty 1

## 2022-12-31 MED ORDER — SENNA 8.6 MG PO TABS
1.0000 | ORAL_TABLET | Freq: Every day | ORAL | Status: DC
  Filled 2022-12-31 (×4): qty 1

## 2022-12-31 MED ORDER — INSULIN GLARGINE-YFGN 100 UNIT/ML ~~LOC~~ SOLN
8.0000 [IU] | Freq: Every day | SUBCUTANEOUS | Status: DC
  Administered 2022-12-31 – 2023-01-01 (×2): 8 [IU] via SUBCUTANEOUS
  Filled 2022-12-31 (×3): qty 0.08

## 2022-12-31 NOTE — Progress Notes (Signed)
Subjective: CC: Reports large volume, loose BM after eating cream of chicken soup yesterday. Reports a lot of cramping abdominal pain after her drain was flushed yesterday. Pain improved this morning, she feels much better. +flatus. Denies nausea.   Objective: Vital signs in last 24 hours: Temp:  [97.8 F (36.6 C)-98 F (36.7 C)] 97.8 F (36.6 C) (06/16 0751) Pulse Rate:  [68-81] 68 (06/16 0751) Resp:  [16-18] 17 (06/16 0751) BP: (133-147)/(61-76) 147/64 (06/16 0751) SpO2:  [99 %-100 %] 100 % (06/16 0751) Last BM Date : 12/30/22  Intake/Output from previous day: No intake/output data recorded. Intake/Output this shift: No intake/output data recorded.  PE: Gen:  Alert, NAD, pleasant Abd: Soft, ND, ttp in the LLQ but improved compared to prior, +BS. IR drain to gravity with air and small amt liquid stool.  Lab Results:  Recent Labs    12/30/22 0028 12/31/22 0432  WBC 11.1* 10.8*  HGB 10.2* 10.4*  HCT 32.1* 33.3*  PLT 234 259   BMET Recent Labs    12/30/22 0028 12/30/22 1548  NA 133* 134*  K 2.9* 4.8  CL 94* 99  CO2 25 25  GLUCOSE 159* 93  BUN 19 16  CREATININE 1.38* 1.36*  CALCIUM 8.5* 8.6*   PT/INR No results for input(s): "LABPROT", "INR" in the last 72 hours.  CMP     Component Value Date/Time   NA 134 (L) 12/30/2022 1548   K 4.8 12/30/2022 1548   CL 99 12/30/2022 1548   CO2 25 12/30/2022 1548   GLUCOSE 93 12/30/2022 1548   BUN 16 12/30/2022 1548   CREATININE 1.36 (H) 12/30/2022 1548   CALCIUM 8.6 (L) 12/30/2022 1548   PROT 7.6 12/26/2022 1341   ALBUMIN 3.2 (L) 12/26/2022 1341   AST 14 (L) 12/26/2022 1341   ALT 15 12/26/2022 1341   ALKPHOS 79 12/26/2022 1341   BILITOT 0.4 12/26/2022 1341   GFRNONAA 42 (L) 12/30/2022 1548   GFRAA >60 04/03/2011 1745   Lipase     Component Value Date/Time   LIPASE 50 12/26/2022 1341    Studies/Results: No results found.  Anti-infectives: Anti-infectives (From admission, onward)    Start      Dose/Rate Route Frequency Ordered Stop   12/27/22 0200  piperacillin-tazobactam (ZOSYN) IVPB 3.375 g       See Hyperspace for full Linked Orders Report.   3.375 g 12.5 mL/hr over 240 Minutes Intravenous Every 8 hours 12/26/22 1854     12/26/22 1900  piperacillin-tazobactam (ZOSYN) IVPB 3.375 g       See Hyperspace for full Linked Orders Report.   3.375 g 100 mL/hr over 30 Minutes Intravenous  Once 12/26/22 1854 12/26/22 2039        Assessment/Plan Descending and Sigmoid Diverticulitis with contained perforation  - S/p IR drain 6/12. Flushes per IR. 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 Cultures w/ E.coli resistant to ampicillin/sulbactam and Bactrim. ?? If flushing continued to cause significant pain then we should consider re-imaging to make sure the drain is not inside the colon. - no emergent surgical needs today. Pain improving. WBC downtrending. Appears to have source control with drain right now. - If patient fails to improve she may require repeating imaging, additional drain placement (or upsize), or surgical intervention resulting in a colectomy/colostomy. - If patient improves with conservative therapies would recommend colonoscopy in ~4-8 weeks depending on clinical course/fistula.   - We will follow with you  At the  patients request I spoke to her sister over the phone and updated her on the plan of care and answered her questions.   FEN - FLD - continue FLD for today, IVF per [facility] VTE - SCDs, Lovenox  ID - Zosyn   I reviewed nursing notes, Consultant (IR) notes, hospitalist notes, last 24 h vitals and pain scores, last 48 h intake and output, last 24 h labs and trends, and last 24 h imaging results.    LOS: 5 days    Adam Phenix , [facility] 12/31/2022, 10:03 AM Please see Amion for pager number during day hours 7:00am-4:30pm

## 2022-12-31 NOTE — Progress Notes (Signed)
[facility] Progress Note Patient: Jillian Montgomery AVW:[id number] DOB: Mar 01, 1956 DOA: 12/26/2022  DOS: the patient was seen and examined on 12/31/2022  Brief hospital course: LAIAH LIAN is a 67 y.o. female with medical history significant of DM2, HTN, CKD3. Currently being treated for diverticulitis with abscess conservatively with IR guided drain placement.  General surgery following.  Most likely patient will require surgery down the road.  Assessment and Plan: Acute descending and sigmoid colon diverticulitis with contained perforation. General surgery consulted.  Recommend IR guided drain placement which was completed on 6/12. Appreciate general surgery follow-up. Drain cultures growing E. coli and anaerobe.  Continue current antibiotic Symptoms improving. Currently on full liquid diet.  Had a BM on 6/16. Will switch IV fluids to [facility] only for now.  HTN. On atenolol and chlorthalidone at home as well as losartan. Blood pressure is actually normal. Heart rate is actually on the lower side therefore atenolol chlorthalidone and losartan all are on hold.   CKD 3A. Baseline serum creatinine appears to be around 1.2.  Worsened to 1.43 in the hospital.  Now improving.   Type 2 diabetes mellitus, uncontrolled with hyperglycemia.  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 On Levemir 10 units nightly and every 4 hours sliding scale. Hemoglobin A1c 10.9. Continue with every 4 hours sliding scale.  Switch Levemir to Semglee.   Subjective: No nausea no vomiting no fever no chills.  Abdominal pain improving.  Passing gas.  Had a BM.  Physical Exam: General: in Mild distress, No Rash Cardiovascular: S1 and S2 Present, No Murmur Respiratory: Good respiratory effort, Bilateral Air entry present. No Crackles, No wheezes Abdomen: Bowel Sound present, LLQ mild tenderness Extremities: No edema Neuro: Alert and oriented x3, no new focal deficit  Data Reviewed: I have Reviewed  nursing notes, Vitals, and Lab results. Since last encounter, pertinent lab results CBC and BMP   . I have ordered test including CBC and BMP  .   Disposition: Status is: Inpatient Remains inpatient appropriate because: Requiring IV antibiotics  enoxaparin (LOVENOX) injection 40 mg Start: 12/28/22 2200 SCDs Start: 12/26/22 2004   Family Communication: No one at bedside Level of care: Med-Surg   Vitals:   12/30/22 1605 12/30/22 1938 12/31/22 0440 12/31/22 0751  BP: 135/76 133/61 135/70 (!) 147/64  Pulse: 81 79 68 68  Resp: 18 16 16 17   Temp: 98 F (36.7 C) 98 F (36.7 C) 98 F (36.7 C) 97.8 F (36.6 C)  TempSrc: Oral   Oral  SpO2: 99% 100% 100% 100%  Weight:      Height:         Author: Lynden Oxford, MD 12/31/2022 11:18 AM  Please look on [email or web address] to find out who is on call.

## 2023-01-01 DIAGNOSIS — K5720 Diverticulitis of large intestine with perforation and abscess without bleeding: Secondary | ICD-10-CM | POA: Diagnosis not present

## 2023-01-01 LAB — BASIC METABOLIC PANEL
Anion gap: 16 — ABNORMAL HIGH (ref 5–15)
BUN: 14 mg/dL (ref 8–23)
CO2: 22 mmol/L (ref 22–32)
Calcium: 9.1 mg/dL (ref 8.9–10.3)
Chloride: 99 mmol/L (ref 98–111)
Creatinine, Ser: 1.24 mg/dL — ABNORMAL HIGH (ref 0.44–1.00)
GFR, Estimated: 47 mL/min — ABNORMAL LOW (ref >60)
Glucose, Bld: 127 mg/dL — ABNORMAL HIGH (ref 70–99)
Potassium: 3.8 mmol/L (ref 3.5–5.1)
Sodium: 137 mmol/L (ref 135–145)

## 2023-01-01 LAB — GLUCOSE, CAPILLARY
Glucose-Capillary: 114 mg/dL — ABNORMAL HIGH (ref 70–99)
Glucose-Capillary: 135 mg/dL — ABNORMAL HIGH (ref 70–99)
Glucose-Capillary: 144 mg/dL — ABNORMAL HIGH (ref 70–99)
Glucose-Capillary: 151 mg/dL — ABNORMAL HIGH (ref 70–99)
Glucose-Capillary: 171 mg/dL — ABNORMAL HIGH (ref 70–99)
Glucose-Capillary: 183 mg/dL — ABNORMAL HIGH (ref 70–99)
Glucose-Capillary: 209 mg/dL — ABNORMAL HIGH (ref 70–99)

## 2023-01-01 LAB — CBC
HCT: 32 % — ABNORMAL LOW (ref 36.0–46.0)
Hemoglobin: 10.1 g/dL — ABNORMAL LOW (ref 12.0–15.0)
MCH: 26.7 pg (ref 26.0–34.0)
MCHC: 31.6 g/dL (ref 30.0–36.0)
MCV: 84.7 fL (ref 80.0–100.0)
Platelets: 276 10*3/uL (ref 150–400)
RBC: 3.78 MIL/uL — ABNORMAL LOW (ref 3.87–5.11)
RDW: 14.6 % (ref 11.5–15.5)
WBC: 9.6 10*3/uL (ref 4.0–10.5)
nRBC: 0 % (ref 0.0–0.2)

## 2023-01-01 LAB — MAGNESIUM: Magnesium: 2 mg/dL (ref 1.7–2.4)

## 2023-01-01 NOTE — Progress Notes (Signed)
Subjective: CC: Having flatus and BM, overall feels much better than when she came in. Reports some cramps when drain is flushed. No n/v.  Objective: Vital signs in last 24 hours: Temp:  [97.7 F (36.5 C)-98.7 F (37.1 C)] 97.7 F (36.5 C) (06/17 0821) Pulse Rate:  [59-74] 59 (06/17 0821) Resp:  [15-16] 16 (06/17 0821) BP: (133-154)/(67-70) 133/67 (06/17 0821) SpO2:  [100 %] 100 % (06/17 0821) Last BM Date : 12/31/22  Intake/Output from previous day: 06/16 0701 - 06/17 0700 In: 10 [I.V.:10] Out: -  Intake/Output this shift: No intake/output data recorded.  PE: Gen:  Alert, NAD, pleasant Abd: Soft, ND, not significantly tender. IR drain to gravity with air and small amt liquid stool.  Lab Results:  Recent Labs    12/31/22 0432 01/01/23 0052  WBC 10.8* 9.6  HGB 10.4* 10.1*  HCT 33.3* 32.0*  PLT 259 276   BMET Recent Labs    12/30/22 1548 01/01/23 0052  NA 134* 137  K 4.8 3.8  CL 99 99  CO2 25 22  GLUCOSE 93 127*  BUN 16 14  CREATININE 1.36* 1.24*  CALCIUM 8.6* 9.1   PT/INR No results for input(s): "LABPROT", "INR" in the last 72 hours.  CMP     Component Value Date/Time   NA 137 01/01/2023 0052   K 3.8 01/01/2023 0052   CL 99 01/01/2023 0052   CO2 22 01/01/2023 0052   GLUCOSE 127 (H) 01/01/2023 0052   BUN 14 01/01/2023 0052   CREATININE 1.24 (H) 01/01/2023 0052   CALCIUM 9.1 01/01/2023 0052   PROT 7.6 12/26/2022 1341   ALBUMIN 3.2 (L) 12/26/2022 1341   AST 14 (L) 12/26/2022 1341   ALT 15 12/26/2022 1341   ALKPHOS 79 12/26/2022 1341   BILITOT 0.4 12/26/2022 1341   GFRNONAA 47 (L) 01/01/2023 0052   GFRAA >60 04/03/2011 1745   Lipase     Component Value Date/Time   LIPASE 50 12/26/2022 1341    Studies/Results: No results found.  Anti-infectives: Anti-infectives (From admission, onward)    Start     Dose/Rate Route Frequency Ordered Stop   12/27/22 0200  piperacillin-tazobactam (ZOSYN) IVPB 3.375 g       See Hyperspace for  full Linked Orders Report.   3.375 g 12.5 mL/hr over 240 Minutes Intravenous Every 8 hours 12/26/22 1854     12/26/22 1900  piperacillin-tazobactam (ZOSYN) IVPB 3.375 g       See Hyperspace for full Linked Orders Report.   3.375 g 100 mL/hr over 30 Minutes Intravenous  Once 12/26/22 1854 12/26/22 2039        Assessment/Plan Descending and Sigmoid Diverticulitis with contained perforation  - S/p IR drain 6/12. Flushes per IR. 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 Cultures w/ E.coli resistant to ampicillin/sulbactam and Bactrim. ??  - no emergent surgical needs at present. Pain ~resolved. No fever. WBC normalized.  - If patient fails to improve she may require repeating imaging, additional drain placement (or upsize), or surgical intervention resulting in a colectomy/colostomy.  - If patient improves with conservative therapies would recommend colonoscopy in ~4-8 weeks depending on clinical course/fistula.  - Ok to change to PO abx to complete 10 day course - We will follow with you  FEN - Diabetic diet ok from our perspective - IVF per [facility] VTE - SCDs, Lovenox  ID - Zosyn   I reviewed nursing notes, Consultant (IR) notes, hospitalist notes, last 43  h vitals and pain scores, last 48 h intake and output, last 24 h labs and trends, and last 24 h imaging results.  I spent a total of 35 minutes in both face-to-face and non-face-to-face activities, excluding procedures performed, for this visit on the date of this encounter.    LOS: 6 days   Marin Olp, MD [facility], [facility]

## 2023-01-01 NOTE — Progress Notes (Addendum)
[facility] Progress Note Patient: Jillian Montgomery ZOX:[id number] DOB: June 02, 1956 DOA: 12/26/2022  DOS: the patient was seen and examined on 01/01/2023  Brief hospital course: Jillian Montgomery is a 67 y.o. female with medical history significant of DM2, HTN, CKD3. Currently being treated for diverticulitis with abscess conservatively with IR guided drain placement.  General surgery following.  Most likely patient will require surgery down the road.  Assessment and Plan: Acute descending and sigmoid colon diverticulitis with contained perforation. General surgery consulted.  Recommend IR guided drain placement which was completed on 6/12. Appreciate general surgery follow-up. Drain cultures growing E. coli and anaerobe.  Continue current antibiotic Symptoms improving. Discontinue IV fluid.  Will discuss with ID pharmacy regarding oral antibiotics.  Augmentin is not an option.  Diet advanced to regular diet  HTN. On atenolol and chlorthalidone at home as well as losartan. Blood pressure is actually normal. Heart rate is actually on the lower side therefore atenolol chlorthalidone and losartan all are on hold.   CKD 3A. Baseline serum creatinine appears to be around 1.2.  Worsened to 1.43 in the hospital.  Now improving.   Type 2 diabetes mellitus, uncontrolled with hyperglycemia.  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 On Levemir 10 units nightly and every 4 hours sliding scale. Hemoglobin A1c 10.9. Continue with every 4 hours sliding scale.  Switch Levemir to Semglee.   Subjective: Abdominal pain improving.  No nausea or vomiting.  No fever or chills.  Passing gas.  Physical Exam: In mild distress. S1-S2 present.  Bowel sound present.  Left lower quadrant Tenderness.  Data Reviewed: I have Reviewed nursing notes, Vitals, and Lab results. Reviewed CBC and BMP.  CBC and BMP.  Disposition: Status is: Inpatient Remains inpatient appropriate because: Requiring IV  antibiotics  enoxaparin (LOVENOX) injection 40 mg Start: 12/28/22 2200 SCDs Start: 12/26/22 2004   Family Communication: No one at bedside Level of care: Med-Surg   Vitals:   12/31/22 2038 01/01/23 0516 01/01/23 0821 01/01/23 1627  BP: (!) 154/68 135/70 133/67 119/62  Pulse: 74 65 (!) 59 64  Resp: 15 15 16 15   Temp: 98.7 F (37.1 C) 98 F (36.7 C) 97.7 F (36.5 C) 97.9 F (36.6 C)  TempSrc: Oral     SpO2: 100% 100% 100% 99%  Weight:      Height:         Author: Lynden Oxford, MD 01/01/2023 6:05 PM  Please look on [email or web address] to find out who is on call.

## 2023-01-01 NOTE — Progress Notes (Signed)
Referring Physician(s): Kris Mouton, MD  Supervising Physician: Ruel Favors  Patient Status:  Jillian Montgomery - In-pt  Chief Complaint: Sigmoid diverticulitis with contained perforation; LLQ abscess s/p IR drain placement 12/27/22 (Dr. Milford Cage)  Subjective:  Feels ok, states the drain feels crampy only when it's flushed but otherwise no abdominal pain, n/v.  Allergies: Lisinopril and Metformin  Medications: Prior to Admission medications   Medication Sig Start Date End Date Taking? Authorizing Provider  atenolol-chlorthalidone (TENORETIC) 50-25 MG per tablet Take 1 tablet by mouth daily.   Yes [provider]  atorvastatin (LIPITOR) 20 MG tablet Take 20 mg by mouth daily. 09/05/17  Yes [provider]  insulin detemir (LEVEMIR) 100 UNIT/ML injection Inject 16 Units into the skin at bedtime. 03/26/14  Yes Rachael Fee, MD  losartan (COZAAR) 100 MG tablet Take 100 mg by mouth daily.   Yes [provider]     Vital Signs: BP 133/67 (BP Location: Right Arm)   Pulse (!) 59   Temp 97.7 F (36.5 C)   Resp 16   Ht 5\' 2"  (1.575 m)   Wt 146 lb (66.2 kg)   SpO2 100%   BMI 26.70 kg/m   Physical Exam Vitals and nursing note reviewed.  Constitutional:      General: She is not in acute distress. HENT:     Head: Normocephalic.  Cardiovascular:     Rate and Rhythm: Normal rate.  Pulmonary:     Effort: Pulmonary effort is normal.  Abdominal:     Palpations: Abdomen is soft.     Comments: (+) LLQ drain to gravity with scant feculent output, some air in bag. Insertion site unremarkable.  Neurological:     Mental Status: She is alert. Mental status is at baseline.     Imaging: No results found.  Labs:  CBC: Recent Labs    12/29/22 0042 12/30/22 0028 12/31/22 0432 01/01/23 0052  WBC 12.8* 11.1* 10.8* 9.6  HGB 11.2* 10.2* 10.4* 10.1*  HCT 34.9* 32.1* 33.3* 32.0*  PLT 263 234 259 276    COAGS: Recent Labs    12/27/22 1013  INR 1.0     BMP: Recent Labs    12/29/22 0042 12/30/22 0028 12/30/22 1548 01/01/23 0052  NA 134* 133* 134* 137  K 3.5 2.9* 4.8 3.8  CL 97* 94* 99 99  CO2 24 25 25 22   GLUCOSE 213* 159* 93 127*  BUN 17 19 16 14   CALCIUM 8.7* 8.5* 8.6* 9.1  CREATININE 1.43* 1.38* 1.36* 1.24*  GFRNONAA 40* 42* 42* 47*    LIVER FUNCTION TESTS: Recent Labs    12/26/22 1341  BILITOT 0.4  AST 14*  ALT 15  ALKPHOS 79  PROT 7.6  ALBUMIN 3.2*    Assessment and Plan:  67 y/o F with history of sigmoid diverticulitis with contained perforation and LLQ abscess s/p IR drain placement 12/27/22 (Dr. Milford Cage) seen today for drain follow up. Output is feculent with air suggestive of controlled fistula -- general surgery following.   Patient feeling well today, only complaint is some cramping when drain is flushed (TID). Tolerating diet.  Drain Location: LLQ Size: Fr size: 12 Fr Date of placement: 12/27/22  Currently to: Drain collection device: gravity 24 hour output:  Output by Drain (mL) 12/30/22 0701 - 12/30/22 1900 12/30/22 1901 - 12/31/22 0700 12/31/22 0701 - 12/31/22 1900 12/31/22 1901 - 01/01/23 0700 01/01/23 0701 - 01/01/23 1454  Open Drain 1 Lateral;Left LLQ 12 Fr.  Interval imaging/drain manipulation:  None  Current examination: Insertion site unremarkable. Suture and stat lock in place. Dressed appropriately.   Plan: *Decrease flushes to once per day due to likely fistula* Record output Q shift - per RN not emptied over the weekend, had < 40 mL output since Friday. Dressing changes QD or PRN if soiled.  Call IR APP or on call IR MD if difficulty flushing or sudden change in drain output.  Discussed with IR attending Dr. Miles Costain who recommends repeating CT later this week if patient remains in house to evaluate drain/fistula.  Discharge planning: Please contact IR APP or on call IR MD prior to patient d/c to ensure appropriate follow up plans are in place. Typically patient will  follow up with IR clinic 10-14 days post d/c for repeat imaging/possible drain injection. IR scheduler will contact patient with date/time of appointment. Patient will need to flush drain QD with 5 cc NS, record output QD, dressing changes every 2-3 days or earlier if soiled.   IR will continue to follow - please call with questions or concerns.  Electronically Signed: Villa Herb, PA-C 01/01/2023, 1:54 PM   I spent a total of 15 Minutes at the the patient's bedside AND on the patient's hospital floor or unit, greater than 50% of which was counseling/coordinating care for intra-abdominal abscess drain.

## 2023-01-01 NOTE — Progress Notes (Signed)
Mobility Specialist Progress Note:    01/01/23 1448  Mobility  Activity Transferred to/from [facility]  Level of Assistance Standby assist, set-up cues, supervision of patient - no hands on  Assistive Device None  Activity Response Tolerated well  Mobility Referral Yes  $Mobility charge 1 Mobility  Mobility Specialist Start Time (ACUTE ONLY) 1437  Mobility Specialist Stop Time (ACUTE ONLY) 1445  Mobility Specialist Time Calculation (min) (ACUTE ONLY) 8 min   Pt received in bed requesting to use the BSC. Pt needed no physical assistance during session. Pt situated on [facility] w/ call bell at hand.   Thompson Grayer Mobility Specialist  Please contact vis Secure Chat or  Rehab Office [phone]

## 2023-01-02 ENCOUNTER — Inpatient Hospital Stay (HOSPITAL_COMMUNITY): Payer: Medicare HMO

## 2023-01-02 DIAGNOSIS — K5720 Diverticulitis of large intestine with perforation and abscess without bleeding: Secondary | ICD-10-CM | POA: Diagnosis not present

## 2023-01-02 LAB — GLUCOSE, CAPILLARY
Glucose-Capillary: 64 mg/dL — ABNORMAL LOW (ref 70–99)
Glucose-Capillary: 178 mg/dL — ABNORMAL HIGH (ref 70–99)
Glucose-Capillary: 210 mg/dL — ABNORMAL HIGH (ref 70–99)
Glucose-Capillary: 157 mg/dL — ABNORMAL HIGH (ref 70–99)
Glucose-Capillary: 145 mg/dL — ABNORMAL HIGH (ref 70–99)
Glucose-Capillary: 154 mg/dL — ABNORMAL HIGH (ref 70–99)

## 2023-01-02 LAB — BASIC METABOLIC PANEL
Anion gap: 11 (ref 5–15)
BUN: 16 mg/dL (ref 8–23)
CO2: 23 mmol/L (ref 22–32)
Calcium: 8.7 mg/dL — ABNORMAL LOW (ref 8.9–10.3)
Chloride: 99 mmol/L (ref 98–111)
Creatinine, Ser: 1.24 mg/dL — ABNORMAL HIGH (ref 0.44–1.00)
GFR, Estimated: 47 mL/min — ABNORMAL LOW (ref >60)
Glucose, Bld: 178 mg/dL — ABNORMAL HIGH (ref 70–99)
Potassium: 3.6 mmol/L (ref 3.5–5.1)
Sodium: 133 mmol/L — ABNORMAL LOW (ref 135–145)

## 2023-01-02 LAB — CBC
HCT: 32.4 % — ABNORMAL LOW (ref 36.0–46.0)
Hemoglobin: 10.2 g/dL — ABNORMAL LOW (ref 12.0–15.0)
MCH: 26.9 pg (ref 26.0–34.0)
MCHC: 31.5 g/dL (ref 30.0–36.0)
MCV: 85.5 fL (ref 80.0–100.0)
Platelets: 302 10*3/uL (ref 150–400)
RBC: 3.79 MIL/uL — ABNORMAL LOW (ref 3.87–5.11)
RDW: 14.4 % (ref 11.5–15.5)
WBC: 9.9 10*3/uL (ref 4.0–10.5)
nRBC: 0 % (ref 0.0–0.2)

## 2023-01-02 LAB — MAGNESIUM: Magnesium: 1.8 mg/dL (ref 1.7–2.4)

## 2023-01-02 MED ORDER — ORAL CARE MOUTH RINSE: 15.0000 mL | OROMUCOSAL | Status: DC | PRN

## 2023-01-02 MED ORDER — IOHEXOL 350 MG/ML SOLN
75.0000 mL | Freq: Once | INTRAVENOUS | Status: AC | PRN
  Administered 2023-01-02: 75 mL via INTRAVENOUS

## 2023-01-02 MED ORDER — METRONIDAZOLE 500 MG PO TABS
500.0000 mg | ORAL_TABLET | Freq: Two times a day (BID) | ORAL | Status: DC
  Administered 2023-01-02 – 2023-01-04 (×4): 500 mg via ORAL
  Filled 2023-01-02 (×5): qty 1

## 2023-01-02 MED ORDER — INSULIN ASPART 100 UNIT/ML IJ SOLN
0.0000 [IU] | Freq: Every day | INTRAMUSCULAR | Status: DC
  Administered 2023-01-03: 2 [IU] via SUBCUTANEOUS

## 2023-01-02 MED ORDER — SODIUM CHLORIDE 0.9 % IV SOLN: INTRAVENOUS | Status: DC

## 2023-01-02 MED ORDER — INSULIN ASPART 100 UNIT/ML IJ SOLN
0.0000 [IU] | Freq: Three times a day (TID) | INTRAMUSCULAR | Status: DC
  Administered 2023-01-02: 1 [IU] via SUBCUTANEOUS
  Administered 2023-01-03: 2 [IU] via SUBCUTANEOUS
  Administered 2023-01-04: 1 [IU] via SUBCUTANEOUS
  Administered 2023-01-04: 3 [IU] via SUBCUTANEOUS

## 2023-01-02 MED ORDER — CEFADROXIL 500 MG PO CAPS
500.0000 mg | ORAL_CAPSULE | Freq: Two times a day (BID) | ORAL | Status: DC
  Administered 2023-01-02 – 2023-01-04 (×4): 500 mg via ORAL
  Filled 2023-01-02 (×5): qty 1

## 2023-01-02 NOTE — Progress Notes (Signed)
Subjective: CC: Having flatus and BM, overall feels much better than when she came in. In good spirits  Objective: Vital signs in last 24 hours: Temp:  [97.9 F (36.6 C)] 97.9 F (36.6 C) (06/17 2042) Pulse Rate:  [64-77] 67 (06/18 0344) Resp:  [15-18] 18 (06/18 0344) BP: (119-134)/(62-67) 134/65 (06/18 0344) SpO2:  [99 %-100 %] 100 % (06/18 0344) Last BM Date : 01/01/23  Intake/Output from previous day: 06/17 0701 - 06/18 0700 In: 918 [P.O.:480; IV Piggyback:438] Out: 30 [Drains:30] Intake/Output this shift: No intake/output data recorded.  PE: Gen:  Alert, NAD, pleasant Abd: Soft, ND, not significantly tender. IR drain to gravity with air and small amt liquid stool.  Lab Results:  Recent Labs    01/01/23 0052 01/02/23 0017  WBC 9.6 9.9  HGB 10.1* 10.2*  HCT 32.0* 32.4*  PLT 276 302   BMET Recent Labs    01/01/23 0052 01/02/23 0017  NA 137 133*  K 3.8 3.6  CL 99 99  CO2 22 23  GLUCOSE 127* 178*  BUN 14 16  CREATININE 1.24* 1.24*  CALCIUM 9.1 8.7*   PT/INR No results for input(s): "LABPROT", "INR" in the last 72 hours.  CMP     Component Value Date/Time   NA 133 (L) 01/02/2023 0017   K 3.6 01/02/2023 0017   CL 99 01/02/2023 0017   CO2 23 01/02/2023 0017   GLUCOSE 178 (H) 01/02/2023 0017   BUN 16 01/02/2023 0017   CREATININE 1.24 (H) 01/02/2023 0017   CALCIUM 8.7 (L) 01/02/2023 0017   PROT 7.6 12/26/2022 1341   ALBUMIN 3.2 (L) 12/26/2022 1341   AST 14 (L) 12/26/2022 1341   ALT 15 12/26/2022 1341   ALKPHOS 79 12/26/2022 1341   BILITOT 0.4 12/26/2022 1341   GFRNONAA 47 (L) 01/02/2023 0017   GFRAA >60 04/03/2011 1745   Lipase     Component Value Date/Time   LIPASE 50 12/26/2022 1341    Studies/Results: No results found.  Anti-infectives: Anti-infectives (From admission, onward)    Start     Dose/Rate Route Frequency Ordered Stop   12/27/22 0200  piperacillin-tazobactam (ZOSYN) IVPB 3.375 g       See Hyperspace for full  Linked Orders Report.   3.375 g 12.5 mL/hr over 240 Minutes Intravenous Every 8 hours 12/26/22 1854     12/26/22 1900  piperacillin-tazobactam (ZOSYN) IVPB 3.375 g       See Hyperspace for full Linked Orders Report.   3.375 g 100 mL/hr over 30 Minutes Intravenous  Once 12/26/22 1854 12/26/22 2039        Assessment/Plan Descending and Sigmoid Diverticulitis with contained perforation  - S/p IR drain 6/12. Flushes per IR. 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 Cultures w/ E.coli resistant to ampicillin/sulbactam and Bactrim.  - no emergent surgical needs at present. Pain ~resolved. No fever. WBC normalized.  - If patient fails to improve she may require repeating imaging, additional drain placement (or upsize), or surgical intervention resulting in a colectomy/colostomy.  - If patient improves with conservative therapies would recommend colonoscopy in ~4-8 weeks depending on clinical course/fistula.  - Ok to change to PO abx to complete 10 day course - We will follow with you  FEN - Diabetic diet ok from our perspective VTE - SCDs, Lovenox  ID - Zosyn - can transition to PO antibiotics based on sensitivities although cultures were essentially of stool as opposed to "abscess"  Will need  IR drain teaching - empty/record/flush as per IR. Once all in place, should be stable for discharge from our perspective with antibiotic course to complete 14 days   I reviewed nursing notes, Consultant (IR) notes, hospitalist notes, last 24 h vitals and pain scores, last 48 h intake and output, last 24 h labs and trends, and last 24 h imaging results.  I spent a total of 35 minutes in both face-to-face and non-face-to-face activities, excluding procedures performed, for this visit on the date of this encounter.   LOS: 7 days   Marin Olp, MD [facility], [facility]

## 2023-01-02 NOTE — Inpatient Diabetes Management (Signed)
Inpatient Diabetes Program Recommendations  AACE/ADA: New Consensus Statement on Inpatient Glycemic Control (2015)  Target Ranges:  Prepandial:   less than 140 mg/dL      Peak postprandial:   less than 180 mg/dL (1-2 hours)      Critically ill patients:  140 - 180 mg/dL   Lab Results  Component Value Date   GLUCAP 210 (H) 01/02/2023   HGBA1C 10.9 (H) 12/26/2022    Review of Glycemic Control  Latest Reference Range & Units 01/01/23 12:17 01/01/23 16:25 01/01/23 20:41 01/01/23 23:46 01/02/23 03:40 01/02/23 04:54 01/02/23 08:10  Glucose-Capillary 70 - 99 mg/dL 829 (H) 562 (H) 130 (H) 183 (H) 64 (L) 178 (H) 210 (H)  Diabetes history: DM 2 Outpatient Diabetes medications: Levemir  16 units qhs Current orders for Inpatient glycemic control:  Levemir 8 units qhs Novolog 0-9 units Q4 Inpatient Diabetes Program Recommendations:    Note low overnight. Since patient is now on PO diet, consider changing Novolog to tid with meals and HS (instead of q 4 hours).   Thanks,  Beryl Meager, RN, BC-ADM Inpatient Diabetes Coordinator Pager [phone]  (8a-5p)

## 2023-01-02 NOTE — Progress Notes (Signed)
  RN alerted IR that patient pulled on drain and sutures are out. Drain appears to be malpositioned.  Discussed with Dr. Fredia Sorrow.  Will obtain CT abd/pelvis to evaluate location of drain. Patient may need drain manipulation.  Will make NPO after MN in case intervention needed.  [name] S Julann Mcgilvray PA-C 01/02/2023 2:48 PM

## 2023-01-02 NOTE — Progress Notes (Signed)
IR notified of drain.

## 2023-01-02 NOTE — Progress Notes (Signed)
Patient got up and pulled drain; sutures no longer secure. Drain does not seem to be in place. Will page MD.

## 2023-01-02 NOTE — Progress Notes (Signed)
[facility] Progress Note Patient: Jillian Montgomery ZOX:[id number] DOB: 30-May-1956 DOA: 12/26/2022  DOS: the patient was seen and examined on 01/02/2023  Brief hospital course: NIKAELA DOM is a 67 y.o. female with medical history significant of DM2, HTN, CKD3. Currently being treated for diverticulitis with abscess conservatively with IR guided drain placement.  General surgery following.  Most likely patient will require surgery down the road.  Assessment and Plan: Acute descending and sigmoid colon diverticulitis with contained perforation. General surgery consulted.  Recommend IR guided drain placement which was performed on 6/12. Appreciate general surgery follow-up. Drain cultures growing E. coli and anaerobe. Was on IV Zosyn.  Now on cefadroxil and Flagyl. Symptoms improving. Unfortunately patient pulled out her drain partially while repositioning herself. CT abdomen ordered. N.p.o. after midnight. Will resume IV fluids overnight.  HTN. On atenolol and chlorthalidone at home as well as losartan. Blood pressure is actually normal without any medication. Heart rate is actually on the lower side therefore atenolol chlorthalidone and losartan all are on hold.   CKD 3A. Baseline serum creatinine appears to be around 1.2.  Worsened to 1.43 in the hospital.  Now improving.   Type 2 diabetes mellitus, uncontrolled with hyperglycemia.  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 Hemoglobin A1c 10.9.  Holding nightly long-acting insulin as the patient will be n.p.o. after midnight. Continue sliding scale insulin.   Subjective: Abdominal pain improving.  Pain located only at the insertion of the drainage catheter.  Later in the day accidentally pulled the drainage catheter out partially.  Physical Exam: In mild distress. S1-S2 present. Bowel sound present. Left lower quadrant tenderness present. No edema.  Data Reviewed: I have Reviewed nursing notes, Vitals, and Lab  results. Reviewed CBC and BMP.  Reordered CBC and BMP.  Disposition: Status is: Inpatient Remains inpatient appropriate because: Requiring IV antibiotics  enoxaparin (LOVENOX) injection 40 mg Start: 12/28/22 2200 SCDs Start: 12/26/22 2004   Family Communication: Family at bedside Level of care: Med-Surg   Vitals:   01/01/23 1627 01/01/23 2042 01/02/23 0344 01/02/23 1605  BP: 119/62 123/67 134/65 (!) 149/65  Pulse: 64 77 67 77  Resp: 15  18 16   Temp: 97.9 F (36.6 C) 97.9 F (36.6 C)  98.4 F (36.9 C)  TempSrc:  Oral  Oral  SpO2: 99% 99% 100% 98%  Weight:      Height:         Author: Lynden Oxford, MD 01/02/2023 6:55 PM  Please look on [email or web address] to find out who is on call.

## 2023-01-02 NOTE — Progress Notes (Signed)
General surgery notified of drain.

## 2023-01-02 NOTE — Plan of Care (Signed)

## 2023-01-03 ENCOUNTER — Inpatient Hospital Stay (HOSPITAL_COMMUNITY): Payer: Medicare HMO

## 2023-01-03 DIAGNOSIS — K5720 Diverticulitis of large intestine with perforation and abscess without bleeding: Secondary | ICD-10-CM | POA: Diagnosis not present

## 2023-01-03 HISTORY — PX: IR CATHETER TUBE CHANGE: IMG717

## 2023-01-03 LAB — CBC
HCT: 31.1 % — ABNORMAL LOW (ref 36.0–46.0)
Hemoglobin: 9.9 g/dL — ABNORMAL LOW (ref 12.0–15.0)
MCH: 27.5 pg (ref 26.0–34.0)
MCHC: 31.8 g/dL (ref 30.0–36.0)
MCV: 86.4 fL (ref 80.0–100.0)
Platelets: 296 10*3/uL (ref 150–400)
RBC: 3.6 MIL/uL — ABNORMAL LOW (ref 3.87–5.11)
RDW: 14.4 % (ref 11.5–15.5)
WBC: 8.3 10*3/uL (ref 4.0–10.5)
nRBC: 0 % (ref 0.0–0.2)

## 2023-01-03 LAB — GLUCOSE, CAPILLARY
Glucose-Capillary: 161 mg/dL — ABNORMAL HIGH (ref 70–99)
Glucose-Capillary: 90 mg/dL (ref 70–99)
Glucose-Capillary: 101 mg/dL — ABNORMAL HIGH (ref 70–99)
Glucose-Capillary: 202 mg/dL — ABNORMAL HIGH (ref 70–99)

## 2023-01-03 LAB — BASIC METABOLIC PANEL
Anion gap: 9 (ref 5–15)
BUN: 13 mg/dL (ref 8–23)
CO2: 24 mmol/L (ref 22–32)
Calcium: 8.3 mg/dL — ABNORMAL LOW (ref 8.9–10.3)
Chloride: 101 mmol/L (ref 98–111)
Creatinine, Ser: 1.19 mg/dL — ABNORMAL HIGH (ref 0.44–1.00)
GFR, Estimated: 50 mL/min — ABNORMAL LOW (ref >60)
Glucose, Bld: 262 mg/dL — ABNORMAL HIGH (ref 70–99)
Potassium: 3.2 mmol/L — ABNORMAL LOW (ref 3.5–5.1)
Sodium: 134 mmol/L — ABNORMAL LOW (ref 135–145)

## 2023-01-03 LAB — MAGNESIUM: Magnesium: 1.7 mg/dL (ref 1.7–2.4)

## 2023-01-03 MED ORDER — POTASSIUM CHLORIDE CRYS ER 20 MEQ PO TBCR: 40.0000 meq | EXTENDED_RELEASE_TABLET | ORAL | Status: DC

## 2023-01-03 MED ORDER — MIDAZOLAM HCL 2 MG/2ML IJ SOLN
INTRAMUSCULAR | Status: AC
  Filled 2023-01-03: qty 4

## 2023-01-03 MED ORDER — SODIUM CHLORIDE 0.9% FLUSH
3.0000 mL | INTRAVENOUS | Status: AC
  Administered 2023-01-03: 3 mL

## 2023-01-03 MED ORDER — FENTANYL CITRATE (PF) 100 MCG/2ML IJ SOLN
INTRAMUSCULAR | Status: AC
  Filled 2023-01-03: qty 2

## 2023-01-03 MED ORDER — FENTANYL CITRATE (PF) 100 MCG/2ML IJ SOLN
INTRAMUSCULAR | Status: AC | PRN
  Administered 2023-01-03 (×2): 50 ug via INTRAVENOUS

## 2023-01-03 MED ORDER — INSULIN GLARGINE-YFGN 100 UNIT/ML ~~LOC~~ SOLN
8.0000 [IU] | Freq: Every day | SUBCUTANEOUS | Status: DC
  Administered 2023-01-03: 8 [IU] via SUBCUTANEOUS
  Filled 2023-01-03 (×2): qty 0.08

## 2023-01-03 MED ORDER — LIDOCAINE HCL 1 % IJ SOLN
INTRAMUSCULAR | Status: AC
  Filled 2023-01-03: qty 20

## 2023-01-03 MED ORDER — IOHEXOL 300 MG/ML  SOLN
50.0000 mL | Freq: Once | INTRAMUSCULAR | Status: AC | PRN
  Administered 2023-01-03: 10 mL

## 2023-01-03 MED ORDER — MIDAZOLAM HCL 2 MG/2ML IJ SOLN
INTRAMUSCULAR | Status: AC | PRN
  Administered 2023-01-03 (×4): 1 mg via INTRAVENOUS

## 2023-01-03 MED ORDER — POTASSIUM CHLORIDE 10 MEQ/100ML IV SOLN
10.0000 meq | INTRAVENOUS | Status: AC
  Administered 2023-01-03 (×4): 10 meq via INTRAVENOUS
  Filled 2023-01-03 (×4): qty 100

## 2023-01-03 NOTE — Progress Notes (Signed)
[facility] Progress Note Patient: Jillian Montgomery WUJ:[id number] DOB: [birth year] DOA: 12/26/2022  DOS: the patient was seen and examined on 01/03/2023  Brief hospital course: Jillian Montgomery is a 67 y.o. female with medical history significant of DM2, HTN, CKD3. Currently being treated for diverticulitis with abscess conservatively with IR guided drain placement.  General surgery following.  Most likely patient will require surgery down the road.  Underwent drain replacement on 6/19.  Assessment and Plan: Acute descending and sigmoid colon diverticulitis with contained perforation. General surgery consulted.  Recommend IR guided drain placement which was performed on 6/12. Appreciate general surgery follow-up. Drain cultures growing E. coli and anaerobe. Was on IV Zosyn.  Now on cefadroxil and Flagyl. Symptoms improving. Unfortunately patient pulled out her drain partially while repositioning herself. CT abdomen shows drain was placed. Replaced on 6/19. Will advance diet  HTN. On atenolol and chlorthalidone at home as well as losartan. Blood pressure is actually normal without any medication. Heart rate is actually on the lower side therefore atenolol chlorthalidone and losartan all are on hold.   CKD 3A. Baseline serum creatinine appears to be around 1.2.  Worsened to 1.43 in the hospital.  Now improving.   Type 2 diabetes mellitus, uncontrolled with hyperglycemia.  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 Hemoglobin A1c 10.9.  Resume Semglee nightly. Continue sliding scale insulin.   Subjective: Abdominal pain improving.  No nausea no vomiting no fever no chills.  Physical Exam: In mild to hospital Bowel sound present. Mild tenderness in the left lower quadrant.  No edema.  Data Reviewed: I have Reviewed nursing notes, Vitals, and Lab results. Reviewed recent BMP.  Reordered CBC and BMP.  Disposition: Status is: Inpatient Remains inpatient appropriate  because: Requiring IV antibiotics  enoxaparin (LOVENOX) injection 40 mg Start: 12/28/22 2200 SCDs Start: 12/26/22 2004   Family Communication: No one at bedside Level of care: Med-Surg   Vitals:   01/03/23 1800 01/03/23 1805 01/03/23 1810 01/03/23 1831  BP: (!) 169/69 (!) 158/82 (!) 161/81 (!) 157/82  Pulse: 82 81 80 74  Resp: 20  14 17   Temp:    98.3 F (36.8 C)  TempSrc:    Oral  SpO2: 100% 99% 100% 100%  Weight:      Height:         Author: Lynden Oxford, MD 01/03/2023 6:37 PM  Please look on [email or web address] to find out who is on call.

## 2023-01-03 NOTE — Care Management Important Message (Signed)
Important Message  Patient Details  Name: EABHA ORBE MRN: [id number] Date of Birth: [birth year]   Medicare Important Message Given:  Yes     Sherilyn Banker 01/03/2023, 4:33 PM

## 2023-01-03 NOTE — Progress Notes (Signed)
Subjective: CC: Having flatus and BM, overall feels much better than when she came in. Remains in good spirits  Objective: Vital signs in last 24 hours: Temp:  [97.9 F (36.6 C)-98.4 F (36.9 C)] 97.9 F (36.6 C) (06/19 0747) Pulse Rate:  [63-77] 69 (06/19 0747) Resp:  [15-17] 15 (06/19 0747) BP: (145-163)/(65-71) 152/71 (06/19 0747) SpO2:  [98 %-100 %] 100 % (06/19 0747) Last BM Date : 01/02/23  Intake/Output from previous day: 06/18 0701 - 06/19 0700 In: 1637.4 [P.O.:240; I.V.:1307.2; IV Piggyback:90.2] Out: 0  Intake/Output this shift: No intake/output data recorded.  PE: Gen:  Alert, NAD, pleasant Abd: Soft, ND, not significantly tender. IR drain to gravity with air and small amt liquid stool.  Lab Results:  Recent Labs    01/02/23 0017 01/03/23 0051  WBC 9.9 8.3  HGB 10.2* 9.9*  HCT 32.4* 31.1*  PLT 302 296   BMET Recent Labs    01/02/23 0017 01/03/23 0051  NA 133* 134*  K 3.6 3.2*  CL 99 101  CO2 23 24  GLUCOSE 178* 262*  BUN 16 13  CREATININE 1.24* 1.19*  CALCIUM 8.7* 8.3*   PT/INR No results for input(s): "LABPROT", "INR" in the last 72 hours.  CMP     Component Value Date/Time   NA 134 (L) 01/03/2023 0051   K 3.2 (L) 01/03/2023 0051   CL 101 01/03/2023 0051   CO2 24 01/03/2023 0051   GLUCOSE 262 (H) 01/03/2023 0051   BUN 13 01/03/2023 0051   CREATININE 1.19 (H) 01/03/2023 0051   CALCIUM 8.3 (L) 01/03/2023 0051   PROT 7.6 12/26/2022 1341   ALBUMIN 3.2 (L) 12/26/2022 1341   AST 14 (L) 12/26/2022 1341   ALT 15 12/26/2022 1341   ALKPHOS 79 12/26/2022 1341   BILITOT 0.4 12/26/2022 1341   GFRNONAA 50 (L) 01/03/2023 0051   GFRAA >60 04/03/2011 1745   Lipase     Component Value Date/Time   LIPASE 50 12/26/2022 1341    Studies/Results: CT ABDOMEN PELVIS W CONTRAST  Result Date: 01/02/2023 CLINICAL DATA:  Status post diverticular abscess drain, drain has pulled back, evaluate position EXAM: CT ABDOMEN AND PELVIS WITH CONTRAST  TECHNIQUE: Multidetector CT imaging of the abdomen and pelvis was performed using the standard protocol following bolus administration of intravenous contrast. RADIATION DOSE REDUCTION: This exam was performed according to the departmental dose-optimization program which includes automated exposure control, adjustment of the mA and/or kV according to patient size and/or use of iterative reconstruction technique. CONTRAST:  75mL OMNIPAQUE IOHEXOL 350 MG/ML SOLN COMPARISON:  12/27/2022 FINDINGS: Lower chest: No acute abnormality. Hepatobiliary: Gallbladder is decompressed. Liver is within normal limits. Pancreas: Unremarkable. No pancreatic ductal dilatation or surrounding inflammatory changes. Spleen: Scattered calcified granulomas are noted. Adrenals/Urinary Tract: Stable right adrenal lesion likely representing an adenoma. This is unchanged from a prior exam of 2012. No further follow-up is recommended. Left adrenal gland is within normal limits. Kidneys demonstrate a normal enhancement pattern bilaterally. No obstructive changes are seen. The bladder is well distended. Stomach/Bowel: Diverticular change of the colon is noted with thickening in the sigmoid and descending colon. Diverticular abscess is seen and stable. The catheter has withdrawn and only the tip of the catheter remains within the abscess cavity. Air is noted within the abdominal wall and subcutaneous fat consistent with the recent repositioning. More proximal colon is within normal limits. The appendix is unremarkable. Small bowel and stomach are unchanged. Vascular/Lymphatic: Aortic atherosclerosis. No enlarged  abdominal or pelvic lymph nodes. Reproductive: Status post hysterectomy. No adnexal masses. Other: No abdominal wall hernia or abnormality. No abdominopelvic ascites. Musculoskeletal: No acute or significant osseous findings. IMPRESSION: Diverticular abscess is again noted in the left lower quadrant. The previously placed dated catheter has  been withdrawn significantly and only the tip lies within the abscess cavity. Air and fluid is noted within the abdominal wall likely representing interval leakage from the catheter repositioning. Recommend IR consultation. Electronically Signed   By: Alcide Clever M.D.   On: 01/02/2023 20:34    Anti-infectives: Anti-infectives (From admission, onward)    Start     Dose/Rate Route Frequency Ordered Stop   01/02/23 1345  cefadroxil (DURICEF) capsule 500 mg        500 mg Oral 2 times daily 01/02/23 1246 01/09/23 0959   01/02/23 1345  metroNIDAZOLE (FLAGYL) tablet 500 mg        500 mg Oral Every 12 hours 01/02/23 1246 01/09/23 0959   12/27/22 0200  piperacillin-tazobactam (ZOSYN) IVPB 3.375 g  Status:  Discontinued       See Hyperspace for full Linked Orders Report.   3.375 g 12.5 mL/hr over 240 Minutes Intravenous Every 8 hours 12/26/22 1854 01/02/23 1246   12/26/22 1900  piperacillin-tazobactam (ZOSYN) IVPB 3.375 g       See Hyperspace for full Linked Orders Report.   3.375 g 100 mL/hr over 30 Minutes Intravenous  Once 12/26/22 1854 12/26/22 2039        Assessment/Plan Descending and Sigmoid Diverticulitis with contained perforation  - S/p IR drain 6/12. Flushes per IR. 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 Cultures w/ E.coli resistant to ampicillin/sulbactam and Bactrim.  - no emergent surgical needs at present. Pain ~resolved. No fever. WBC normalized.  - If patient fails to improve she may require repeating imaging, additional drain placement (or upsize), or surgical intervention resulting in a colectomy/colostomy.  - If patient improves with conservative therapies would recommend colonoscopy in ~4-8 weeks depending on clinical course/fistula.  - Ok to change to PO abx to complete 10 day course - IR following for drain repositioning  FEN - Diabetic diet ok from our perspective VTE - SCDs, Lovenox  ID - Zosyn - can transition to PO antibiotics based on  sensitivities although cultures were essentially of stool as opposed to "abscess"  Will need IR drain teaching - empty/record/flush as per IR. Once all in place, should be stable for discharge from our perspective with antibiotic course to complete 14 days   I reviewed nursing notes, Consultant (IR) notes, hospitalist notes, last 24 h vitals and pain scores, last 48 h intake and output, last 24 h labs and trends, and last 24 h imaging results.  I spent a total of 35 minutes in both face-to-face and non-face-to-face activities, excluding procedures performed, for this visit on the date of this encounter.   LOS: 8 days   Marin Olp, MD [facility], [facility]

## 2023-01-03 NOTE — Procedures (Signed)
Interventional Radiology Procedure:   Indications: Malpositioned percutaneous drain with large amount of air in subcutaneous tissues  Procedure: Drain injection and drain exchange  Findings: Old drain was injected and contrast filled the subcutaneous space.  New drain was positioned back into the diverticular abscess / large diverticulum.  There is a connection or fistula to the colon.  In addition, the skin around the drain is very indurated. Bloody purulent fluid was expressed from the drain site.    Complications: None     EBL: Minimal  Plan: Keep drain in place for now with minimal flushing.  Need surgery to evaluate the subcutaneous tissue.  May need follow up CT in a few days to see if patient may need a subcutaneous drain as well.    Jaycee Pelzer R. Lowella Dandy, MD  Pager: [phone]

## 2023-01-03 NOTE — Progress Notes (Signed)
Referring Physician(s): Dr. Marin Olp  Supervising Physician: Richarda Overlie  Patient Status:  [facility] - In-pt  Chief Complaint: Malpositioned drain  Subjective: Patient with malpositioned drain by CT.  Concern for fistula based on output.  Otherwise nearing discharge.  She reports concern about drain management at home.   Allergies: Lisinopril and Metformin  Medications: Prior to Admission medications   Medication Sig Start Date End Date Taking? Authorizing Provider  atenolol-chlorthalidone (TENORETIC) 50-25 MG per tablet Take 1 tablet by mouth daily.   Yes [provider]  atorvastatin (LIPITOR) 20 MG tablet Take 20 mg by mouth daily. 09/05/17  Yes [provider]  insulin detemir (LEVEMIR) 100 UNIT/ML injection Inject 16 Units into the skin at bedtime. 03/26/14  Yes Rachael Fee, MD  losartan (COZAAR) 100 MG tablet Take 100 mg by mouth daily.   Yes [provider]     Vital Signs: BP (!) 152/71 (BP Location: Right Arm)   Pulse 69   Temp 97.9 F (36.6 C)   Resp 15   Ht 5\' 2"  (1.575 m)   Wt 146 lb (66.2 kg)   SpO2 100%   BMI 26.70 kg/m   Physical Exam NAD, alert Abdomen: soft, non-tender.  Drain retracted with leakage around insertion site, dressing soiled.   Imaging: CT ABDOMEN PELVIS W CONTRAST  Result Date: 01/02/2023 CLINICAL DATA:  Status post diverticular abscess drain, drain has pulled back, evaluate position EXAM: CT ABDOMEN AND PELVIS WITH CONTRAST TECHNIQUE: Multidetector CT imaging of the abdomen and pelvis was performed using the standard protocol following bolus administration of intravenous contrast. RADIATION DOSE REDUCTION: This exam was performed according to the departmental dose-optimization program which includes automated exposure control, adjustment of the mA and/or kV according to patient size and/or use of iterative reconstruction technique. CONTRAST:  75mL OMNIPAQUE IOHEXOL 350 MG/ML SOLN COMPARISON:   12/27/2022 FINDINGS: Lower chest: No acute abnormality. Hepatobiliary: Gallbladder is decompressed. Liver is within normal limits. Pancreas: Unremarkable. No pancreatic ductal dilatation or surrounding inflammatory changes. Spleen: Scattered calcified granulomas are noted. Adrenals/Urinary Tract: Stable right adrenal lesion likely representing an adenoma. This is unchanged from a prior exam of 2012. No further follow-up is recommended. Left adrenal gland is within normal limits. Kidneys demonstrate a normal enhancement pattern bilaterally. No obstructive changes are seen. The bladder is well distended. Stomach/Bowel: Diverticular change of the colon is noted with thickening in the sigmoid and descending colon. Diverticular abscess is seen and stable. The catheter has withdrawn and only the tip of the catheter remains within the abscess cavity. Air is noted within the abdominal wall and subcutaneous fat consistent with the recent repositioning. More proximal colon is within normal limits. The appendix is unremarkable. Small bowel and stomach are unchanged. Vascular/Lymphatic: Aortic atherosclerosis. No enlarged abdominal or pelvic lymph nodes. Reproductive: Status post hysterectomy. No adnexal masses. Other: No abdominal wall hernia or abnormality. No abdominopelvic ascites. Musculoskeletal: No acute or significant osseous findings. IMPRESSION: Diverticular abscess is again noted in the left lower quadrant. The previously placed dated catheter has been withdrawn significantly and only the tip lies within the abscess cavity. Air and fluid is noted within the abdominal wall likely representing interval leakage from the catheter repositioning. Recommend IR consultation. Electronically Signed   By: Alcide Clever M.D.   On: 01/02/2023 20:34    Labs:  CBC: Recent Labs    12/31/22 0432 01/01/23 0052 01/02/23 0017 01/03/23 0051  WBC 10.8* 9.6 9.9 8.3  HGB 10.4* 10.1* 10.2* 9.9*  HCT 33.3* 32.0* 32.4* 31.1*  PLT  259 276 302 296    COAGS: Recent Labs    12/27/22 1013  INR 1.0    BMP: Recent Labs    12/30/22 1548 01/01/23 0052 01/02/23 0017 01/03/23 0051  NA 134* 137 133* 134*  K 4.8 3.8 3.6 3.2*  CL 99 99 99 101  CO2 25 22 23 24   GLUCOSE 93 127* 178* 262*  BUN 16 14 16 13   CALCIUM 8.6* 9.1 8.7* 8.3*  CREATININE 1.36* 1.24* 1.24* 1.19*  GFRNONAA 42* 47* 47* 50*    LIVER FUNCTION TESTS: Recent Labs    12/26/22 1341  BILITOT 0.4  AST 14*  ALT 15  ALKPHOS 79  PROT 7.6  ALBUMIN 3.2*    Assessment and Plan: Diverticulitis s/p perforation with abscess s/p drain placement 12/27/22 Patient with LLQ drain in place.  Stitch dislodged, drain retracted inadvertently while patient attempting to reposition.  CT obtained and confirmed drain not in good position.  Will plan for drain exchange today.  Patient is nearing discharge with questions re: management.  Will assess needs based on outcome of drain evaluation today.   Electronically Signed: Hoyt Koch, PA 01/03/2023, 9:48 AM   I spent a total of 15 Minutes at the the patient's bedside AND on the patient's hospital floor or unit, greater than 50% of which was counseling/coordinating care for malpositioned drain, diverticulitis.

## 2023-01-04 ENCOUNTER — Other Ambulatory Visit (HOSPITAL_COMMUNITY): Payer: Self-pay

## 2023-01-04 DIAGNOSIS — K5720 Diverticulitis of large intestine with perforation and abscess without bleeding: Secondary | ICD-10-CM | POA: Diagnosis not present

## 2023-01-04 LAB — CBC WITH DIFFERENTIAL/PLATELET
Abs Immature Granulocytes: 0.12 10*3/uL — ABNORMAL HIGH (ref 0.00–0.07)
Basophils Absolute: 0 10*3/uL (ref 0.0–0.1)
Basophils Relative: 0 %
Eosinophils Absolute: 0.1 10*3/uL (ref 0.0–0.5)
Eosinophils Relative: 1 %
HCT: 32.4 % — ABNORMAL LOW (ref 36.0–46.0)
Hemoglobin: 10.1 g/dL — ABNORMAL LOW (ref 12.0–15.0)
Immature Granulocytes: 2 %
Lymphocytes Relative: 21 %
Lymphs Abs: 1.7 10*3/uL (ref 0.7–4.0)
MCH: 26.3 pg (ref 26.0–34.0)
MCHC: 31.2 g/dL (ref 30.0–36.0)
MCV: 84.4 fL (ref 80.0–100.0)
Monocytes Absolute: 0.8 10*3/uL (ref 0.1–1.0)
Monocytes Relative: 10 %
Neutro Abs: 5.2 10*3/uL (ref 1.7–7.7)
Neutrophils Relative %: 66 %
Platelets: 308 10*3/uL (ref 150–400)
RBC: 3.84 MIL/uL — ABNORMAL LOW (ref 3.87–5.11)
RDW: 14.4 % (ref 11.5–15.5)
WBC: 7.9 10*3/uL (ref 4.0–10.5)
nRBC: 0 % (ref 0.0–0.2)

## 2023-01-04 LAB — BASIC METABOLIC PANEL
Anion gap: 9 (ref 5–15)
BUN: 5 mg/dL — ABNORMAL LOW (ref 8–23)
CO2: 23 mmol/L (ref 22–32)
Calcium: 8.5 mg/dL — ABNORMAL LOW (ref 8.9–10.3)
Chloride: 103 mmol/L (ref 98–111)
Creatinine, Ser: 0.99 mg/dL (ref 0.44–1.00)
GFR, Estimated: >60 mL/min (ref >60)
Glucose, Bld: 155 mg/dL — ABNORMAL HIGH (ref 70–99)
Potassium: 3.5 mmol/L (ref 3.5–5.1)
Sodium: 135 mmol/L (ref 135–145)

## 2023-01-04 LAB — GLUCOSE, CAPILLARY
Glucose-Capillary: 146 mg/dL — ABNORMAL HIGH (ref 70–99)
Glucose-Capillary: 209 mg/dL — ABNORMAL HIGH (ref 70–99)

## 2023-01-04 LAB — MAGNESIUM: Magnesium: 1.7 mg/dL (ref 1.7–2.4)

## 2023-01-04 MED ORDER — INSULIN DETEMIR 100 UNIT/ML ~~LOC~~ SOLN: 10.0000 [IU] | Freq: Every day | SUBCUTANEOUS | 11 refills | Status: AC

## 2023-01-04 MED ORDER — METRONIDAZOLE 500 MG PO TABS
500.0000 mg | ORAL_TABLET | Freq: Two times a day (BID) | ORAL | 0 refills | Status: AC
  Filled 2023-01-04: qty 12, 6d supply, fill #0

## 2023-01-04 MED ORDER — CEFADROXIL 500 MG PO CAPS
500.0000 mg | ORAL_CAPSULE | Freq: Two times a day (BID) | ORAL | 0 refills | Status: AC
  Filled 2023-01-04: qty 12, 6d supply, fill #0

## 2023-01-04 MED ORDER — POLYETHYLENE GLYCOL 3350 17 GM/SCOOP PO POWD
17.0000 g | Freq: Every day | ORAL | 0 refills | Status: AC
  Filled 2023-01-04: qty 238, 14d supply, fill #0

## 2023-01-04 MED ORDER — OXYCODONE-ACETAMINOPHEN 5-325 MG PO TABS
1.0000 | ORAL_TABLET | Freq: Four times a day (QID) | ORAL | 0 refills | Status: DC | PRN
  Filled 2023-01-04: qty 20, 5d supply, fill #0

## 2023-01-04 NOTE — Plan of Care (Signed)

## 2023-01-04 NOTE — Discharge Summary (Signed)
Physician Discharge Summary   Patient: Jillian Montgomery MRN: [id number] DOB: 10/18/1955  Admit date:     12/26/2022  Discharge date: 01/04/23  Discharge Physician: Lynden Oxford  PCP: Merri Brunette, MD  Recommendations at discharge: Follow-up with PCP in 1 week. Follow-up with IR as recommended. Follow-up with general surgery in 6 weeks. Follow-up with GI for colonoscopy.   Follow-up Information     [facility] Follow up.   Specialty: Home Health Services Contact information: [address] [place] [address] [phone]         Merri Brunette, MD. Schedule an appointment as soon as possible for a visit in 1 week(s).   Specialty: Family Medicine Contact information: [phone] [address] [place] [address] [phone]         Surgery, [place]. Schedule an appointment as soon as possible for a visit in 1 month(s).   Specialty: General Surgery Contact information: [address] [place] [address] [phone]         Gastroenterology. Schedule an appointment as soon as possible for a visit in 2 month(s).   Why: need colonoscopy after diverticulitis is resolved.        [facility] Follow up.   Why: Please follow up with [facility]iology for drain evaluation. A scheduler from our office will call you with a date/time of your appointment. Please call our office with any questions/concerns prior to your visit. Contact information: [address] [place] [address] [phone]                Discharge Diagnoses: Principal Problem:   Diverticulitis of large intestine with abscess Active Problems:   DM2 (diabetes mellitus, type 2) (HCC)   CKD (chronic kidney disease) stage 3, GFR 30-59 ml/min (HCC)   HTN (hypertension)  Hospital Course: Brief hospital course: Jillian Montgomery is a 67 y.o. female with medical history significant of DM2, HTN, CKD3. Currently  being treated for diverticulitis with abscess conservatively with IR guided drain placement.  General surgery following.  Most likely patient will require surgery down the road.  Underwent drain replacement on 6/19.  Assessment and Plan: Acute descending and sigmoid colon diverticulitis with contained perforation. General surgery consulted.  Recommend IR guided drain placement which was performed on 6/12. Appreciate general surgery follow-up. Drain cultures growing E. coli and anaerobe. Was on IV Zosyn.  Now on cefadroxil and Flagyl. Symptoms improving. Unfortunately patient pulled out her drain partially while repositioning herself. CT abdomen shows drain was placed. Replaced on 6/19. Tolerating advance diet  HTN. On atenolol and chlorthalidone at home as well as losartan. Blood pressure is actually normal without any medication. Heart rate is actually on the lower side therefore atenolol chlorthalidone and losartan all are on hold. Will add hydralazine for blood pressure   CKD 3A. Baseline serum creatinine appears to be around 1.2.  Worsened to 1.43 in the hospital.  Now improving.   Type 2 diabetes mellitus, uncontrolled with hyperglycemia.  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 Hemoglobin A1c 10.9.  Resume home regimen but at a lower dose.  Pain control - [employer] Controlled Substance Reporting System database was reviewed. and patient was instructed, not to drive, operate heavy machinery, perform activities at heights, swimming or participation in water activities or provide baby-sitting services while on Pain, Sleep and Anxiety Medications; until their outpatient Physician has advised to do so again. Also recommended to not to  take more than prescribed Pain, Sleep and Anxiety Medications.  Consultants:  General surgery IR Procedures performed:  IR guided drain placement. IR guided drain exchange.  DISCHARGE MEDICATION: Allergies as of 01/04/2023        Reactions   Lisinopril Swelling   Pt stated, "lips swell up"   Metformin Diarrhea        Medication List     STOP taking these medications    atenolol-chlorthalidone 50-25 MG tablet Commonly known as: TENORETIC       TAKE these medications    atorvastatin 20 MG tablet Commonly known as: LIPITOR Take 20 mg by mouth daily.   cefadroxil 500 MG capsule Commonly known as: DURICEF Take 1 capsule (500 mg total) by mouth 2 (two) times daily for 6 days.   insulin detemir 100 UNIT/ML injection Commonly known as: Levemir Inject 0.1 mLs (10 Units total) into the skin at bedtime. What changed: how much to take   losartan 100 MG tablet Commonly known as: COZAAR Take 100 mg by mouth daily.   metroNIDAZOLE 500 MG tablet Commonly known as: FLAGYL Take 1 tablet (500 mg total) by mouth 2 (two) times daily for 6 days.   oxyCODONE-acetaminophen 5-325 MG tablet Commonly known as: PERCOCET/ROXICET Take 1 tablet by mouth every 6 (six) hours as needed for severe pain or moderate pain.   polyethylene glycol powder 17 GM/SCOOP powder Commonly known as: GLYCOLAX/MIRALAX Mix 1 capful (17 g total) in 8 oz clear liquid and drink by mouth once daily. Start taking on: January 05, 2023               Discharge Care Instructions  (From admission, onward)           Start     Ordered   01/04/23 0000  Leave dressing on - Keep it clean, dry, and intact until clinic visit        01/04/23 1159           Disposition: Home Diet recommendation: Carb modified diet  Discharge Exam: Vitals:   01/03/23 2047 01/03/23 2337 01/04/23 0517 01/04/23 0902  BP: (!) 163/63 133/63 (!) 154/68 (!) 162/70  Pulse: 79 77 69 69  Resp:  16  18  Temp: 98.2 F (36.8 C) 98.3 F (36.8 C) 98.4 F (36.9 C) 98.6 F (37 C)  TempSrc: Oral Oral Oral Oral  SpO2: 97% 100% 100% 99%  Weight:      Height:       General: Appear in mild distress; no visible Abnormal Neck Mass Or lumps, Conjunctiva  normal Cardiovascular: S1 and S2 Present, no Murmur, Respiratory: good respiratory effort, Bilateral Air entry present and CTA, no Crackles, no wheezes Abdomen: Bowel Sound present, Non tender, only pain at the drain insertion site. Extremities: no Pedal edema Neurology: alert and oriented to time, place, and person  Filed Weights   12/26/22 1253  Weight: 66.2 kg   Condition at discharge: stable  The results of significant diagnostics from this hospitalization (including imaging, microbiology, ancillary and laboratory) are listed below for reference.   Imaging Studies: IR Catheter Tube Change  Result Date: 01/04/2023 INDICATION: 67 year old with history of diverticulitis and percutaneous drain was placed in a suspected diverticular abscess on 12/27/2022. Recent CT demonstrates that the percutaneous drain has been retracted and there is now large amount of gas within the left flank subcutaneous tissues. EXAM: 1. Drain injection 2. Drain exchange/repositioning with fluoroscopy MEDICATIONS: Moderate sedation ANESTHESIA/SEDATION: Moderate (conscious) sedation was employed during this procedure.  A total of Versed 4mg  and fentanyl 100 mcg was administered intravenously at the order of the provider performing the procedure. Total intra-service moderate sedation time: 22 minutes. Patient's level of consciousness and vital signs were monitored continuously by radiology nurse throughout the procedure under the supervision of the provider performing the procedure. FLUOROSCOPY TIME:  Radiation Exposure Index (as provided by the fluoroscopic device): 23 mGy Kerma CONTRAST:  10 mL Omnipaque 300 COMPLICATIONS: None immediate. PROCEDURE: Informed written consent was obtained from the patient after a thorough discussion of the procedural risks, benefits and alternatives. All questions were addressed.A timeout was performed prior to the initiation of the procedure. Patient was placed supine. The existing catheter and  left abdomen were prepped and draped in sterile fashion. Maximal barrier sterile technique was utilized including caps, mask, sterile gowns, sterile gloves, sterile drape, hand hygiene and skin antiseptic. Drain was injected with contrast. The retention suture was cut. The catheter was cut and removed over a Bentson wire. A Kumpe catheter was successfully advanced into the pericolonic collection. A 10 French Dawson Mueller drain was advanced over the wire and reconstituted within the collection. Additional contrast was injected to confirm placement. In addition, a large amount of bloody purulent fluid was expressed around the drainage catheter. New drain was secured to skin with suture and attached to a gravity bag. Dressing was placed. FINDINGS: The old drain was partially retracted and contrast injection demonstrated contrast out of the pericolonic collection, presumably in the subcutaneous space. New drain was successfully advanced into the pericolonic collection and Kumpe catheter was even advanced into the colon itself. New 10 French drain was placed within the pericolonic collection. The pericolonic collection is well-formed and could represent a giant diverticulum rather than a true abscess. There is a connection or fistula between the pericolonic collection and the colon. IMPRESSION: 1. Successful exchange and repositioning of the percutaneous drain. The drain was repositioned within the pericolonic collection. There is connection between this pericolonic collection and the colon. 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 2. Large amount of bloody purulent fluid expressed around the drain compatible with the subcutaneous air and fluid seen on the recent CT. Electronically Signed   By: Richarda Overlie M.D.   On: 01/04/2023 09:59   CT ABDOMEN PELVIS W CONTRAST  Result Date: 01/02/2023 CLINICAL DATA:  Status post diverticular abscess drain, drain has  pulled back, evaluate position EXAM: CT ABDOMEN AND PELVIS WITH CONTRAST TECHNIQUE: Multidetector CT imaging of the abdomen and pelvis was performed using the standard protocol following bolus administration of intravenous contrast. RADIATION DOSE REDUCTION: This exam was performed according to the departmental dose-optimization program which includes automated exposure control, adjustment of the mA and/or kV according to patient size and/or use of iterative reconstruction technique. CONTRAST:  75mL OMNIPAQUE IOHEXOL 350 MG/ML SOLN COMPARISON:  12/27/2022 FINDINGS: Lower chest: No acute abnormality. Hepatobiliary: Gallbladder is decompressed. Liver is within normal limits. Pancreas: Unremarkable. No pancreatic ductal dilatation or surrounding inflammatory changes. Spleen: Scattered calcified granulomas are noted. Adrenals/Urinary Tract: Stable right adrenal lesion likely representing an adenoma. This is unchanged from a prior exam of 2012. No further follow-up is recommended. Left adrenal gland is within normal limits. Kidneys demonstrate a normal enhancement pattern bilaterally. No obstructive changes are seen. The bladder is well distended. Stomach/Bowel: Diverticular change of the colon is noted with thickening in the sigmoid and descending colon. Diverticular abscess is seen and stable. The catheter has withdrawn and  only the tip of the catheter remains within the abscess cavity. Air is noted within the abdominal wall and subcutaneous fat consistent with the recent repositioning. More proximal colon is within normal limits. The appendix is unremarkable. Small bowel and stomach are unchanged. Vascular/Lymphatic: Aortic atherosclerosis. No enlarged abdominal or pelvic lymph nodes. Reproductive: Status post hysterectomy. No adnexal masses. Other: No abdominal wall hernia or abnormality. No abdominopelvic ascites. Musculoskeletal: No acute or significant osseous findings. IMPRESSION: Diverticular abscess is again  noted in the left lower quadrant. The previously placed dated catheter has been withdrawn significantly and only the tip lies within the abscess cavity. Air and fluid is noted within the abdominal wall likely representing interval leakage from the catheter repositioning. Recommend IR consultation. Electronically Signed   By: Alcide Clever M.D.   On: 01/02/2023 20:34   CT GUIDED PERITONEAL/RETROPERITONEAL FLUID DRAIN BY PERC CATH  Result Date: 12/27/2022 INDICATION: [id number] Colonic diverticular abscess [id number] EXAM: CT-GUIDED DRAIN PLACEMENT INTO PERICOLONIC ABCESS COMPARISON:  CT AP, 12/26/2022 MEDICATIONS: The patient is currently admitted to the hospital and receiving intravenous antibiotics. The antibiotics were administered within an appropriate time frame prior to the initiation of the procedure. ANESTHESIA/SEDATION: Moderate (conscious) sedation was employed during this procedure. A total of Versed 2 mg and Fentanyl 100 mcg was administered intravenously. Moderate Sedation Time: 22 minutes. The patient's level of consciousness and vital signs were monitored continuously by radiology nursing throughout the procedure under my direct supervision. CONTRAST:  None COMPLICATIONS: None immediate. PROCEDURE: RADIATION DOSE REDUCTION: This exam was performed according to the departmental dose-optimization program which includes automated exposure control, adjustment of the mA and/or kV according to patient size and/or use of iterative reconstruction technique. Informed written consent was obtained from the patient and/or patient's representative after a discussion of the risks, benefits and alternatives to treatment. The patient was placed supine on the CT gantry and a pre procedural CT was performed re-demonstrating the known abscess/fluid collection within the LEFT lower quadrant abdomen. The procedure was planned. A timeout was performed prior to the initiation of the procedure. The LEFT lower quadrant was  prepped and draped in the usual sterile fashion. The overlying soft tissues were anesthetized with 1% lidocaine with epinephrine. Appropriate trajectory was planned with the use of a 22 gauge spinal needle. An 18 gauge trocar needle was advanced into the abscess/fluid collection and a short Amplatz super stiff wire was coiled within the collection. Appropriate positioning was confirmed with a limited CT scan. The tract was serially dilated allowing placement of a 12 Fr drainage catheter. Appropriate positioning was confirmed with a limited postprocedural CT scan. 5 mL of purulent fluid was aspirated. The tube was connected to a drainage bag and sutured in place. A dressing was placed. The patient tolerated the procedure well without immediate post procedural complication. IMPRESSION: Successful CT-guided placement of a 12 Fr percutaneous drainage catheter into the LEFT lower quadrant abscess with aspiration of 5 mL mL of purulent fluid. Samples were sent to the laboratory as requested by the ordering clinical team. Roanna Banning, MD [facility] Electronically Signed   By: Roanna Banning M.D.   On: 12/27/2022 16:45   CT ABDOMEN PELVIS W CONTRAST  Result Date: 12/26/2022 CLINICAL DATA:  Abdominal pain EXAM: CT ABDOMEN AND PELVIS WITH CONTRAST TECHNIQUE: Multidetector CT imaging of the abdomen and pelvis was performed using the standard protocol following bolus administration of intravenous contrast. RADIATION DOSE REDUCTION: This exam was performed according to the departmental  dose-optimization program which includes automated exposure control, adjustment of the mA and/or kV according to patient size and/or use of iterative reconstruction technique. CONTRAST:  70mL OMNIPAQUE IOHEXOL 350 MG/ML SOLN COMPARISON:  04/03/2011 FINDINGS: Lower chest: No acute findings are seen in lower lung fields. Hepatobiliary: No focal abnormalities are seen in liver. There is no  dilation of bile ducts. Gallbladder is unremarkable. Pancreas: There is mild prominence of pancreatic duct. No focal abnormalities are seen. Spleen: There are small calcified nodules, possibly granulomas in spleen. Adrenals/Urinary Tract: There is 4 cm nodule in right adrenal with no significant interval change. There is no hydronephrosis. There are no renal or ureteral stones. There is 11 mm smooth marginated fluid density lesion in the upper pole of right kidney suggesting renal cyst. Stomach/Bowel: Stomach is unremarkable. Small bowel loops are not dilated. Appendix is not dilated. Multiple diverticula are seen in colon. There is wall thickening in the distal descending colon and in proximal sigmoid colon. There is 4.2 x 2.6 cm loculated structure in the left margin of junction of descending and sigmoid colon containing pockets of air and possibly stool. Possibility of contained perforation is not excluded. Perirectal soft tissues are unremarkable. Vascular/Lymphatic: Scattered arterial calcifications are seen. Reproductive: Uterus is not seen.  There are no adnexal masses. Other: There is no ascites or pneumoperitoneum. Small umbilical hernia containing fat is seen. Musculoskeletal: Degenerative changes are noted in lumbar spine, more so at L4-L5 level. IMPRESSION: Diverticulosis of colon. There is abnormal wall thickening and pericolic stranding at the junction of descending and sigmoid colon in left iliac fossa suggesting acute diverticulitis. There is 4.2 x 2.6 cm area of fluid and stool in the left margin of: In left iliac fossa. Possibility of contained perforation with abscess should be considered. Short-term follow-up CT may be considered to re-evaluate this finding. There is no evidence of intestinal obstruction or pneumoperitoneum. There is no hydronephrosis. Appendix is not dilated. There is 4 cm nodule in right adrenal with no significant change suggesting possible adenoma. Other findings as described  in the body of the report. Electronically Signed   By: Ernie Avena M.D.   On: 12/26/2022 16:46    Microbiology: Results for orders placed or performed during the hospital encounter of 12/26/22  Aerobic/Anaerobic Culture w Gram Stain (surgical/deep wound)     Status: None   Collection Time: 12/27/22 10:45 AM   Specimen: Abscess  Result Value Ref Range Status   Specimen Description ABSCESS  Final   Special Requests NONE  Final   Gram Stain   Final    ABUNDANT WBC PRESENT, PREDOMINANTLY PMN ABUNDANT GRAM POSITIVE COCCI ABUNDANT GRAM NEGATIVE RODS MODERATE GRAM POSITIVE RODS FEW YEAST    Culture   Final    ABUNDANT ESCHERICHIA COLI ABUNDANT BACTEROIDES VULGATUS BETA LACTAMASE NEGATIVE MODERATE BACTEROIDES DISTASONIS BETA LACTAMASE POSITIVE Performed at [facility], [address] [place], [place] [address]    Report Status 12/31/2022 FINAL  Final   Organism ID, Bacteria ESCHERICHIA COLI  Final      Susceptibility   Escherichia coli - MIC*    AMPICILLIN >=32 RESISTANT Resistant     CEFEPIME <=0.12 SENSITIVE Sensitive     CEFTAZIDIME <=1 SENSITIVE Sensitive     CEFTRIAXONE <=0.25 SENSITIVE Sensitive     CIPROFLOXACIN <=0.25 SENSITIVE Sensitive     GENTAMICIN <=1 SENSITIVE Sensitive     IMIPENEM <=0.25 SENSITIVE Sensitive     TRIMETH/SULFA >=320 RESISTANT Resistant     AMPICILLIN/SULBACTAM 16 INTERMEDIATE  Intermediate     PIP/TAZO <=4 SENSITIVE Sensitive     * ABUNDANT ESCHERICHIA COLI   Labs: CBC: Recent Labs  Lab 12/31/22 0432 01/01/23 0052 01/02/23 0017 01/03/23 0051 01/04/23 0901  WBC 10.8* 9.6 9.9 8.3 7.9  NEUTROABS  --   --   --   --  5.2  HGB 10.4* 10.1* 10.2* 9.9* 10.1*  HCT 33.3* 32.0* 32.4* 31.1* 32.4*  MCV 84.9 84.7 85.5 86.4 84.4  PLT 259 276 302 296 308   Basic Metabolic Panel: Recent Labs  Lab 12/30/22 0028 12/30/22 1548 01/01/23 0052 01/02/23 0017 01/03/23 0051 01/04/23 0901  NA 133* 134* 137 133* 134* 135  K 2.9* 4.8 3.8 3.6  3.2* 3.5  CL 94* 99 99 99 101 103  CO2 25 25 22 23 24 23   GLUCOSE 159* 93 127* 178* 262* 155*  BUN 19 16 14 16 13  5*  CREATININE 1.38* 1.36* 1.24* 1.24* 1.19* 0.99  CALCIUM 8.5* 8.6* 9.1 8.7* 8.3* 8.5*  MG 2.0  --  2.0 1.8 1.7 1.7   Liver Function Tests: No results for input(s): "AST", "ALT", "ALKPHOS", "BILITOT", "PROT", "ALBUMIN" in the last 168 hours. CBG: Recent Labs  Lab 01/03/23 1143 01/03/23 1830 01/03/23 2049 01/04/23 0900 01/04/23 1248  GLUCAP 90 101* 202* 146* 209*    Discharge time spent: greater than 30 minutes.  Author: Lynden Oxford, MD  [facility]

## 2023-01-04 NOTE — Progress Notes (Signed)
Referring Physician(s): Dr. Cliffton Asters  Supervising Physician: Irish Lack  Patient Status:  Jillian Montgomery - In-pt  Chief Complaint:   Sigmoid diverticulitis with contained perforation; LLQ abscess s/p IR drain placement 12/27/22 (Dr. Milford Cage)   Subjective: Patient in bed waiting for discharge. Female family member at the bedside. Patient denies any significant pain or discomfort.   Allergies: Lisinopril and Metformin  Medications: Prior to Admission medications   Medication Sig Start Date End Date Taking? Authorizing Provider  atenolol-chlorthalidone (TENORETIC) 50-25 MG per tablet Take 1 tablet by mouth daily.   Yes [provider]  atorvastatin (LIPITOR) 20 MG tablet Take 20 mg by mouth daily. 09/05/17  Yes [provider]  losartan (COZAAR) 100 MG tablet Take 100 mg by mouth daily.   Yes [provider]  cefadroxil (DURICEF) 500 MG capsule Take 1 capsule (500 mg total) by mouth 2 (two) times daily for 6 days. 01/04/23 01/10/23  Rolly Salter, MD  insulin detemir (LEVEMIR) 100 UNIT/ML injection Inject 0.1 mLs (10 Units total) into the skin at bedtime. 01/04/23   Rolly Salter, MD  metroNIDAZOLE (FLAGYL) 500 MG tablet Take 1 tablet (500 mg total) by mouth 2 (two) times daily for 6 days. 01/04/23 01/10/23  Rolly Salter, MD  oxyCODONE-acetaminophen (PERCOCET/ROXICET) 5-325 MG tablet Take 1 tablet by mouth every 6 (six) hours as needed for severe pain or moderate pain. 01/04/23   Rolly Salter, MD  polyethylene glycol powder (GLYCOLAX/MIRALAX) 17 GM/SCOOP powder Mix 1 capful (17 g total) in 8 oz clear liquid and drink by mouth once daily. 01/05/23   Rolly Salter, MD     Vital Signs: BP (!) 162/70 (BP Location: Right Arm)   Pulse 69   Temp 98.6 F (37 C) (Oral)   Resp 18   Ht 5\' 2"  (1.575 m)   Wt 146 lb (66.2 kg)   SpO2 99%   BMI 26.70 kg/m   Physical Exam Constitutional:      General: She is not in acute distress.    Appearance: She is not  ill-appearing.  Pulmonary:     Effort: Pulmonary effort is normal.  Abdominal:     Comments: LLQ drain to gravity. Approximately 10 ml of thin, clear brown fluid in bag. Dressing is mildly soiled. Skin insertion site shows erythema and induration around the drain.   Skin:    General: Skin is warm and dry.  Neurological:     Mental Status: She is alert and oriented to person, place, and time.     Imaging: IR Catheter Tube Change  Result Date: 01/04/2023 INDICATION: 67 year old with history of diverticulitis and percutaneous drain was placed in a suspected diverticular abscess on 12/27/2022. Recent CT demonstrates that the percutaneous drain has been retracted and there is now large amount of gas within the left flank subcutaneous tissues. EXAM: 1. Drain injection 2. Drain exchange/repositioning with fluoroscopy MEDICATIONS: Moderate sedation ANESTHESIA/SEDATION: Moderate (conscious) sedation was employed during this procedure. A total of Versed 4mg  and fentanyl 100 mcg was administered intravenously at the order of the provider performing the procedure. Total intra-service moderate sedation time: 22 minutes. Patient's level of consciousness and vital signs were monitored continuously by radiology nurse throughout the procedure under the supervision of the provider performing the procedure. FLUOROSCOPY TIME:  Radiation Exposure Index (as provided by the fluoroscopic device): 23 mGy Kerma CONTRAST:  10 mL Omnipaque 300 COMPLICATIONS: None immediate. PROCEDURE: Informed written consent was obtained from the patient after a thorough discussion  of the procedural risks, benefits and alternatives. All questions were addressed.A timeout was performed prior to the initiation of the procedure. Patient was placed supine. The existing catheter and left abdomen were prepped and draped in sterile fashion. Maximal barrier sterile technique was utilized including caps, mask, sterile gowns, sterile gloves, sterile  drape, hand hygiene and skin antiseptic. Drain was injected with contrast. The retention suture was cut. The catheter was cut and removed over a Bentson wire. A Kumpe catheter was successfully advanced into the pericolonic collection. A 10 French Dawson Mueller drain was advanced over the wire and reconstituted within the collection. Additional contrast was injected to confirm placement. In addition, a large amount of bloody purulent fluid was expressed around the drainage catheter. New drain was secured to skin with suture and attached to a gravity bag. Dressing was placed. FINDINGS: The old drain was partially retracted and contrast injection demonstrated contrast out of the pericolonic collection, presumably in the subcutaneous space. New drain was successfully advanced into the pericolonic collection and Kumpe catheter was even advanced into the colon itself. New 10 French drain was placed within the pericolonic collection. The pericolonic collection is well-formed and could represent a giant diverticulum rather than a true abscess. There is a connection or fistula between the pericolonic collection and the colon. IMPRESSION: 1. Successful exchange and repositioning of the percutaneous drain. The drain was repositioned within the pericolonic collection. There is connection between this pericolonic collection and the colon. 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 2. Large amount of bloody purulent fluid expressed around the drain compatible with the subcutaneous air and fluid seen on the recent CT. Electronically Signed   By: Richarda Overlie M.D.   On: 01/04/2023 09:59   CT ABDOMEN PELVIS W CONTRAST  Result Date: 01/02/2023 CLINICAL DATA:  Status post diverticular abscess drain, drain has pulled back, evaluate position EXAM: CT ABDOMEN AND PELVIS WITH CONTRAST TECHNIQUE: Multidetector CT imaging of the abdomen and pelvis was performed using the standard  protocol following bolus administration of intravenous contrast. RADIATION DOSE REDUCTION: This exam was performed according to the departmental dose-optimization program which includes automated exposure control, adjustment of the mA and/or kV according to patient size and/or use of iterative reconstruction technique. CONTRAST:  75mL OMNIPAQUE IOHEXOL 350 MG/ML SOLN COMPARISON:  12/27/2022 FINDINGS: Lower chest: No acute abnormality. Hepatobiliary: Gallbladder is decompressed. Liver is within normal limits. Pancreas: Unremarkable. No pancreatic ductal dilatation or surrounding inflammatory changes. Spleen: Scattered calcified granulomas are noted. Adrenals/Urinary Tract: Stable right adrenal lesion likely representing an adenoma. This is unchanged from a prior exam of 2012. No further follow-up is recommended. Left adrenal gland is within normal limits. Kidneys demonstrate a normal enhancement pattern bilaterally. No obstructive changes are seen. The bladder is well distended. Stomach/Bowel: Diverticular change of the colon is noted with thickening in the sigmoid and descending colon. Diverticular abscess is seen and stable. The catheter has withdrawn and only the tip of the catheter remains within the abscess cavity. Air is noted within the abdominal wall and subcutaneous fat consistent with the recent repositioning. More proximal colon is within normal limits. The appendix is unremarkable. Small bowel and stomach are unchanged. Vascular/Lymphatic: Aortic atherosclerosis. No enlarged abdominal or pelvic lymph nodes. Reproductive: Status post hysterectomy. No adnexal masses. Other: No abdominal wall hernia or abnormality. No abdominopelvic ascites. Musculoskeletal: No acute or significant osseous findings. IMPRESSION: Diverticular abscess is again noted in the left lower quadrant. The previously  placed dated catheter has been withdrawn significantly and only the tip lies within the abscess cavity. Air and fluid is  noted within the abdominal wall likely representing interval leakage from the catheter repositioning. Recommend IR consultation. Electronically Signed   By: Alcide Clever M.D.   On: 01/02/2023 20:34    Labs:  CBC: Recent Labs    01/01/23 0052 01/02/23 0017 01/03/23 0051 01/04/23 0901  WBC 9.6 9.9 8.3 7.9  HGB 10.1* 10.2* 9.9* 10.1*  HCT 32.0* 32.4* 31.1* 32.4*  PLT 276 302 296 308    COAGS: Recent Labs    12/27/22 1013  INR 1.0    BMP: Recent Labs    01/01/23 0052 01/02/23 0017 01/03/23 0051 01/04/23 0901  NA 137 133* 134* 135  K 3.8 3.6 3.2* 3.5  CL 99 99 101 103  CO2 22 23 24 23   GLUCOSE 127* 178* 262* 155*  BUN 14 16 13  5*  CALCIUM 9.1 8.7* 8.3* 8.5*  CREATININE 1.24* 1.24* 1.19* 0.99  GFRNONAA 47* 47* 50* >60    LIVER FUNCTION TESTS: Recent Labs    12/26/22 1341  BILITOT 0.4  AST 14*  ALT 15  ALKPHOS 79  PROT 7.6  ALBUMIN 3.2*    Assessment and Plan:  Sigmoid diverticulitis with contained perforation; LLQ abscess s/p IR drain placement 12/27/22 (Dr. Milford Cage)   Drain injected/exchanged 01/03/23. 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 Patient with plans for discharge home today. Order placed for patient to follow up with IR in approximately two weeks. Patient instructed to keep the site clean and dry and to document the daily output. Patient will not flush the drain at home due to fistula. Patient advised to call IR with any drain-related questions/concerns prior to her appointment with [place].   Electronically Signed: Alwyn Ren, AGACNP-BC [phone] 01/04/2023, 1:48 PM   I spent a total of 15 Minutes at the the patient's bedside AND on the patient's hospital floor or unit, greater than 50% of which was counseling/coordinating care for diverticular abscess drain.

## 2023-01-04 NOTE — TOC Initial Note (Addendum)
Transition of Care [facility]) - Initial/Assessment Note    Patient Details  Name: Jillian Montgomery MRN: [id number] Date of Birth: 1956-03-03  [facility]) CM/SW Contact:    Kingsley Plan, RN Phone Number: 01/04/2023, 10:46 AM  Clinical Narrative:                  Spoke to patient at bedside. Patient requesting home health RN to flush her drain post discharge.   Patient states she cannot turn her head to see the drain.   NCM explained insurance will not cover a home health visit three times a day.   Patient has a roommate who is on hemodialysis three times a week. NCM asked if he would be willing to assist her at home. NCM can still still for orders for [facility], but home health requires a teachable caregiver. Patient states he will be willing to assist.  Tresa Endo with [facility] accepted referral for [facility] . Asked MD to sign orders   [address] Charlies Silvers with [facility] message patient is discharging today   Expected Discharge Plan: Home w Home Health Services Barriers to Discharge: Continued Medical Work up   Patient Goals and CMS Choice Patient states their goals for this hospitalization and ongoing recovery are:: to return to home CMS Medicare.gov Compare Post Acute Care list provided to:: Patient Choice offered to / list presented to : Patient [place] ownership interest in [facility].provided to:: Patient    Expected Discharge Plan and Services   Discharge Planning Services: CM Consult Post Acute Care Choice: Home Health Living arrangements for the past 2 months: Single Family Home                 DME Arranged: N/A         HH Arranged: RN          Prior Living Arrangements/Services Living arrangements for the past 2 months: Single Family Home Lives with:: Roommate Patient language and need for interpreter reviewed:: Yes Do you feel safe going back to the place where you live?: Yes      Need for Family Participation in Patient Care: Yes  (Comment) Care giver support system in place?: Yes (comment)   Criminal Activity/Legal Involvement Pertinent to Current Situation/Hospitalization: No - Comment as needed  Activities of Daily Living Home Assistive Devices/Equipment: None ADL Screening (condition at time of admission) Patient's cognitive ability adequate to safely complete daily activities?: Yes Is the patient deaf or have difficulty hearing?: Yes Does the patient have difficulty seeing, even when wearing glasses/contacts?: Yes Does the patient have difficulty concentrating, remembering, or making decisions?: No Patient able to express need for assistance with ADLs?: Yes Does the patient have difficulty dressing or bathing?: No Independently performs ADLs?: Yes (appropriate for developmental age) Communication: Independent Dressing (OT): Independent Grooming: Independent Feeding: Independent Bathing: Independent Toileting: Independent In/Out Bed: Independent Walks in Home: Independent Does the patient have difficulty walking or climbing stairs?: No Weakness of Legs: None Weakness of Arms/Hands: None  Permission Sought/Granted   Permission granted to share information with : No              Emotional Assessment Appearance:: Appears stated age Attitude/Demeanor/Rapport: Engaged Affect (typically observed): Accepting Orientation: : Oriented to Self, Oriented to Place, Oriented to  Time, Oriented to Situation Alcohol / Substance Use: Not Applicable Psych Involvement: No (comment)  Admission diagnosis:  Diverticulitis of large intestine with abscess [K57.20] Patient Active Problem List   Diagnosis Date Noted  DM2 (diabetes mellitus, type 2) (HCC) 12/26/2022   CKD (chronic kidney disease) stage 3, GFR 30-59 ml/min (HCC) 12/26/2022   Diverticulitis of large intestine with abscess 12/26/2022   HTN (hypertension) 12/26/2022   PCP:  Merri Brunette, MD Pharmacy:   [id number] - [place], [place] - [address] [place] [address] Phone: [phone] Fax: [phone]     Social Determinants of Health (SDOH) Social History: SDOH Screenings   Food Insecurity: No Food Insecurity (12/26/2022)  Housing: Low Risk  (12/26/2022)  Transportation Needs: No Transportation Needs (12/26/2022)  Utilities: Not At Risk (12/26/2022)  Tobacco Use: Low Risk  (01/04/2023)   SDOH Interventions:     Readmission Risk Interventions     No data to display

## 2023-01-04 NOTE — Progress Notes (Signed)
Subjective: CC: Having flatus and BM, overall feels much better than when she came in.  Had IR drain repositioned and notes significant improvement even relative to when she had her old IR drain.  Is also no longer having a significant amount of gas or stool from it.  They did note a subcutaneous collection that decompressed with placement.  Suspect drain was previously on a large diverticulum based on the contents of the effluent.  Now is more serosanguineous.  Objective: Vital signs in last 24 hours: Temp:  [98.2 F (36.8 C)-98.6 F (37 C)] 98.6 F (37 C) (06/20 0902) Pulse Rate:  [69-82] 69 (06/20 0902) Resp:  [14-20] 18 (06/20 0902) BP: (133-198)/(63-95) 162/70 (06/20 0902) SpO2:  [97 %-100 %] 99 % (06/20 0902) Last BM Date : 01/03/23  Intake/Output from previous day: 06/19 0701 - 06/20 0700 In: 3 [I.V.:3] Out: -  Intake/Output this shift: No intake/output data recorded.  PE: Gen:  Alert, NAD, pleasant Abd: Soft, ND, not significantly tender. IR drain to gravity with more serosang type contents; no significant stool/gas in appliance.  Lab Results:  Recent Labs    01/03/23 0051 01/04/23 0901  WBC 8.3 7.9  HGB 9.9* 10.1*  HCT 31.1* 32.4*  PLT 296 308   BMET Recent Labs    01/02/23 0017 01/03/23 0051  NA 133* 134*  K 3.6 3.2*  CL 99 101  CO2 23 24  GLUCOSE 178* 262*  BUN 16 13  CREATININE 1.24* 1.19*  CALCIUM 8.7* 8.3*   PT/INR No results for input(s): "LABPROT", "INR" in the last 72 hours.  CMP     Component Value Date/Time   NA 134 (L) 01/03/2023 0051   K 3.2 (L) 01/03/2023 0051   CL 101 01/03/2023 0051   CO2 24 01/03/2023 0051   GLUCOSE 262 (H) 01/03/2023 0051   BUN 13 01/03/2023 0051   CREATININE 1.19 (H) 01/03/2023 0051   CALCIUM 8.3 (L) 01/03/2023 0051   PROT 7.6 12/26/2022 1341   ALBUMIN 3.2 (L) 12/26/2022 1341   AST 14 (L) 12/26/2022 1341   ALT 15 12/26/2022 1341   ALKPHOS 79 12/26/2022 1341   BILITOT 0.4 12/26/2022 1341    GFRNONAA 50 (L) 01/03/2023 0051   GFRAA >60 04/03/2011 1745   Lipase     Component Value Date/Time   LIPASE 50 12/26/2022 1341    Studies/Results: CT ABDOMEN PELVIS W CONTRAST  Result Date: 01/02/2023 CLINICAL DATA:  Status post diverticular abscess drain, drain has pulled back, evaluate position EXAM: CT ABDOMEN AND PELVIS WITH CONTRAST TECHNIQUE: Multidetector CT imaging of the abdomen and pelvis was performed using the standard protocol following bolus administration of intravenous contrast. RADIATION DOSE REDUCTION: This exam was performed according to the departmental dose-optimization program which includes automated exposure control, adjustment of the mA and/or kV according to patient size and/or use of iterative reconstruction technique. CONTRAST:  75mL OMNIPAQUE IOHEXOL 350 MG/ML SOLN COMPARISON:  12/27/2022 FINDINGS: Lower chest: No acute abnormality. Hepatobiliary: Gallbladder is decompressed. Liver is within normal limits. Pancreas: Unremarkable. No pancreatic ductal dilatation or surrounding inflammatory changes. Spleen: Scattered calcified granulomas are noted. Adrenals/Urinary Tract: Stable right adrenal lesion likely representing an adenoma. This is unchanged from a prior exam of 2012. No further follow-up is recommended. Left adrenal gland is within normal limits. Kidneys demonstrate a normal enhancement pattern bilaterally. No obstructive changes are seen. The bladder is well distended. Stomach/Bowel: Diverticular change of the colon is noted with thickening in the sigmoid  and descending colon. Diverticular abscess is seen and stable. The catheter has withdrawn and only the tip of the catheter remains within the abscess cavity. Air is noted within the abdominal wall and subcutaneous fat consistent with the recent repositioning. More proximal colon is within normal limits. The appendix is unremarkable. Small bowel and stomach are unchanged. Vascular/Lymphatic: Aortic atherosclerosis. No  enlarged abdominal or pelvic lymph nodes. Reproductive: Status post hysterectomy. No adnexal masses. Other: No abdominal wall hernia or abnormality. No abdominopelvic ascites. Musculoskeletal: No acute or significant osseous findings. IMPRESSION: Diverticular abscess is again noted in the left lower quadrant. The previously placed dated catheter has been withdrawn significantly and only the tip lies within the abscess cavity. Air and fluid is noted within the abdominal wall likely representing interval leakage from the catheter repositioning. Recommend IR consultation. Electronically Signed   By: Alcide Clever M.D.   On: 01/02/2023 20:34    Anti-infectives: Anti-infectives (From admission, onward)    Start     Dose/Rate Route Frequency Ordered Stop   01/02/23 1345  cefadroxil (DURICEF) capsule 500 mg        500 mg Oral 2 times daily 01/02/23 1246 01/09/23 0959   01/02/23 1345  metroNIDAZOLE (FLAGYL) tablet 500 mg        500 mg Oral Every 12 hours 01/02/23 1246 01/09/23 0959   12/27/22 0200  piperacillin-tazobactam (ZOSYN) IVPB 3.375 g  Status:  Discontinued       See Hyperspace for full Linked Orders Report.   3.375 g 12.5 mL/hr over 240 Minutes Intravenous Every 8 hours 12/26/22 1854 01/02/23 1246   12/26/22 1900  piperacillin-tazobactam (ZOSYN) IVPB 3.375 g       See Hyperspace for full Linked Orders Report.   3.375 g 100 mL/hr over 30 Minutes Intravenous  Once 12/26/22 1854 12/26/22 2039        Assessment/Plan Descending and Sigmoid Diverticulitis with contained perforation  - S/p IR drain 6/12. Flushes per IR. 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 Cultures w/ E.coli resistant to ampicillin/sulbactam and Bactrim.  - no emergent surgical needs at present. Pain ~resolved. No fever. WBC normalized.  - Ok to change to PO abx to complete 10 day course - IR reposition drain and now is no longer putting out stool or gas but her symptoms have also dramatically  improved.  FEN - Diabetic diet ok from our perspective VTE - SCDs, Lovenox  ID - Zosyn - can transition to PO antibiotics based on sensitivities although cultures were essentially of stool as opposed to "abscess"  Will need IR drain teaching - empty/record/flush as per IR. Once all in place, should be stable for discharge from our perspective with antibiotic course to complete 14 days  She does not feel completely comfortable managing the drain at present, would consider home health for this purpose.   I reviewed nursing notes, Consultant (IR) notes, hospitalist notes, last 24 h vitals and pain scores, last 48 h intake and output, last 24 h labs and trends, and last 24 h imaging results.  I spent a total of 36 minutes in both face-to-face and non-face-to-face activities, excluding procedures performed, for this visit on the date of this encounter.   LOS: 9 days   Marin Olp, MD [facility]

## 2023-01-05 ENCOUNTER — Other Ambulatory Visit (HOSPITAL_COMMUNITY): Payer: Self-pay | Admitting: Surgery

## 2023-01-05 DIAGNOSIS — K5720 Diverticulitis of large intestine with perforation and abscess without bleeding: Secondary | ICD-10-CM

## 2023-01-07 DIAGNOSIS — E1165 Type 2 diabetes mellitus with hyperglycemia: Secondary | ICD-10-CM | POA: Diagnosis not present

## 2023-01-07 DIAGNOSIS — Z4803 Encounter for change or removal of drains: Secondary | ICD-10-CM | POA: Diagnosis not present

## 2023-01-07 DIAGNOSIS — Z79891 Long term (current) use of opiate analgesic: Secondary | ICD-10-CM | POA: Diagnosis not present

## 2023-01-07 DIAGNOSIS — E1122 Type 2 diabetes mellitus with diabetic chronic kidney disease: Secondary | ICD-10-CM | POA: Diagnosis not present

## 2023-01-07 DIAGNOSIS — I129 Hypertensive chronic kidney disease with stage 1 through stage 4 chronic kidney disease, or unspecified chronic kidney disease: Secondary | ICD-10-CM | POA: Diagnosis not present

## 2023-01-07 DIAGNOSIS — N1831 Chronic kidney disease, stage 3a: Secondary | ICD-10-CM | POA: Diagnosis not present

## 2023-01-07 DIAGNOSIS — Z794 Long term (current) use of insulin: Secondary | ICD-10-CM | POA: Diagnosis not present

## 2023-01-07 DIAGNOSIS — K5720 Diverticulitis of large intestine with perforation and abscess without bleeding: Secondary | ICD-10-CM | POA: Diagnosis not present

## 2023-01-07 DIAGNOSIS — E7849 Other hyperlipidemia: Secondary | ICD-10-CM | POA: Diagnosis not present

## 2023-01-11 DIAGNOSIS — E1136 Type 2 diabetes mellitus with diabetic cataract: Secondary | ICD-10-CM | POA: Diagnosis not present

## 2023-01-11 DIAGNOSIS — K5720 Diverticulitis of large intestine with perforation and abscess without bleeding: Secondary | ICD-10-CM | POA: Diagnosis not present

## 2023-01-11 DIAGNOSIS — I10 Essential (primary) hypertension: Secondary | ICD-10-CM | POA: Diagnosis not present

## 2023-01-16 ENCOUNTER — Encounter: Payer: Self-pay | Admitting: Physician Assistant

## 2023-01-16 DIAGNOSIS — D72829 Elevated white blood cell count, unspecified: Secondary | ICD-10-CM | POA: Diagnosis not present

## 2023-01-19 ENCOUNTER — Ambulatory Visit
Admission: RE | Admit: 2023-01-19 | Discharge: 2023-01-19 | Disposition: A | Discharge status: Home / Self Care | Payer: Medicare HMO | Source: Ambulatory Visit | Attending: Surgery | Admitting: Surgery

## 2023-01-19 ENCOUNTER — Ambulatory Visit
Admission: RE | Admit: 2023-01-19 | Discharge: 2023-01-19 | Disposition: A | Discharge status: Home / Self Care | Payer: Medicare HMO | Source: Ambulatory Visit | Attending: Student | Admitting: Student

## 2023-01-19 ENCOUNTER — Other Ambulatory Visit (HOSPITAL_COMMUNITY): Payer: Self-pay

## 2023-01-19 DIAGNOSIS — N183 Chronic kidney disease, stage 3 unspecified: Secondary | ICD-10-CM | POA: Diagnosis not present

## 2023-01-19 DIAGNOSIS — K5720 Diverticulitis of large intestine with perforation and abscess without bleeding: Secondary | ICD-10-CM

## 2023-01-19 DIAGNOSIS — E119 Type 2 diabetes mellitus without complications: Secondary | ICD-10-CM | POA: Diagnosis not present

## 2023-01-19 DIAGNOSIS — E785 Hyperlipidemia, unspecified: Secondary | ICD-10-CM | POA: Diagnosis not present

## 2023-01-19 DIAGNOSIS — I10 Essential (primary) hypertension: Secondary | ICD-10-CM | POA: Diagnosis not present

## 2023-01-19 DIAGNOSIS — K5730 Diverticulosis of large intestine without perforation or abscess without bleeding: Secondary | ICD-10-CM | POA: Diagnosis not present

## 2023-01-19 DIAGNOSIS — K630 Abscess of intestine: Secondary | ICD-10-CM | POA: Diagnosis not present

## 2023-01-19 HISTORY — PX: IR RADIOLOGIST EVAL & MGMT: IMG5224

## 2023-01-19 MED ORDER — IOPAMIDOL (ISOVUE-300) INJECTION 61%
100.0000 mL | Freq: Once | INTRAVENOUS | Status: AC | PRN
  Administered 2023-01-19: 100 mL via INTRAVENOUS

## 2023-01-19 MED ORDER — NORMAL SALINE NICU FLUSH
INTRAVENOUS | 0 refills | Status: AC
  Filled 2023-01-19: qty 200, 20d supply, fill #0

## 2023-01-19 NOTE — Progress Notes (Signed)
Patient ID: Jillian Montgomery, female   DOB: April 25, 1956, 67 y.o.   MRN: [id number]       Chief Complaint:   Sigmoid diverticulitis with contained perforation;   Referring Physician(s): Angelena Form MD  History of Present Illness: Jillian Montgomery is a 67 y.o. female  12/26/22 presented with diverticular abscess 12/27/22 CT drain placed 01/02/23 CT showed drain had partially retracted and collection persisted 01/03/23 catheter change and repositioning Little output since then. No flushing regimen. Mild persistent low abd pain. CT today shows little improvement in abscess, no complicating features.   Past Medical History:  Diagnosis Date   Chronic kidney disease, stage III (moderate) (HCC)    Other and unspecified hyperlipidemia    Type II or unspecified type diabetes mellitus without mention of complication, not stated as uncontrolled    Unspecified essential hypertension     Past Surgical History:  Procedure Laterality Date   IR CATHETER TUBE CHANGE  01/03/2023    Allergies: Lisinopril and Metformin  Medications: Prior to Admission medications   Medication Sig Start Date End Date Taking? Authorizing Provider  atorvastatin (LIPITOR) 20 MG tablet Take 20 mg by mouth daily. 09/05/17   [provider]  insulin detemir (LEVEMIR) 100 UNIT/ML injection Inject 0.1 mLs (10 Units total) into the skin at bedtime. 01/04/23   Rolly Salter, MD  losartan (COZAAR) 100 MG tablet Take 100 mg by mouth daily.    [provider]  ns flush (NS FLUSH) 0.9% SOLN Flush once daily with 5 ml. 01/19/23     oxyCODONE-acetaminophen (PERCOCET/ROXICET) 5-325 MG tablet Take 1 tablet by mouth every 6 (six) hours as needed for severe pain or moderate pain. 01/04/23   Rolly Salter, MD  polyethylene glycol powder (GLYCOLAX/MIRALAX) 17 GM/SCOOP powder Mix 1 capful (17 g total) in 8 oz clear liquid and drink by mouth once daily. 01/05/23   Rolly Salter, MD     Family History  Problem Relation  Age of Onset   Breast cancer Mother     Social History   Socioeconomic History   Marital status: Single    Spouse name: Not on file   Number of children: Not on file   Years of education: Not on file   Highest education level: Not on file  Occupational History   Not on file  Tobacco Use   Smoking status: Never   Smokeless tobacco: Never  Substance and Sexual Activity   Alcohol use: Not on file   Drug use: Not on file   Sexual activity: Not on file  Other Topics Concern   Not on file  Social History Narrative   Not on file   Social Determinants of Health   Financial Resource Strain: Not on file  Food Insecurity: No Food Insecurity (12/26/2022)   Hunger Vital Sign    Worried About Running Out of Food in the Last Year: Never true    Ran Out of Food in the Last Year: Never true  Transportation Needs: No Transportation Needs (12/26/2022)   PRAPARE - Administrator, Civil Service (Medical): No    Lack of Transportation (Non-Medical): No  Physical Activity: Not on file  Stress: Not on file  Social Connections: Not on file    ECOG Status: 1 - Symptomatic but completely ambulatory  Review of Systems: A 12 point ROS discussed and pertinent positives are indicated in the HPI above.  All other systems are negative.  Review of Systems  Vital Signs:  BP 138/63 (BP Location: Right Arm)   Pulse 88   Temp 98.3 F (36.8 C) (Oral)   Resp 20   SpO2 100%     Physical Exam Drain site c/d/I. Fecal appearing residual content in drain bag (not enough to drain out valve). Tube flushed easily with 5ml NS although this was uncomfortable for patient at brisk rate, tolerated more slowly.     Imaging: IR Catheter Tube Change  Result Date: 01/04/2023 INDICATION: 67 year old with history of diverticulitis and percutaneous drain was placed in a suspected diverticular abscess on 12/27/2022. Recent CT demonstrates that the percutaneous drain has been retracted and there is now  large amount of gas within the left flank subcutaneous tissues. EXAM: 1. Drain injection 2. Drain exchange/repositioning with fluoroscopy MEDICATIONS: Moderate sedation ANESTHESIA/SEDATION: Moderate (conscious) sedation was employed during this procedure. A total of Versed 4mg  and fentanyl 100 mcg was administered intravenously at the order of the provider performing the procedure. Total intra-service moderate sedation time: 22 minutes. Patient's level of consciousness and vital signs were monitored continuously by radiology nurse throughout the procedure under the supervision of the provider performing the procedure. FLUOROSCOPY TIME:  Radiation Exposure Index (as provided by the fluoroscopic device): 23 mGy Kerma CONTRAST:  10 mL Omnipaque 300 COMPLICATIONS: None immediate. PROCEDURE: Informed written consent was obtained from the patient after a thorough discussion of the procedural risks, benefits and alternatives. All questions were addressed.A timeout was performed prior to the initiation of the procedure. Patient was placed supine. The existing catheter and left abdomen were prepped and draped in sterile fashion. Maximal barrier sterile technique was utilized including caps, mask, sterile gowns, sterile gloves, sterile drape, hand hygiene and skin antiseptic. Drain was injected with contrast. The retention suture was cut. The catheter was cut and removed over a Bentson wire. A Kumpe catheter was successfully advanced into the pericolonic collection. A 10 French Dawson Mueller drain was advanced over the wire and reconstituted within the collection. Additional contrast was injected to confirm placement. In addition, a large amount of bloody purulent fluid was expressed around the drainage catheter. New drain was secured to skin with suture and attached to a gravity bag. Dressing was placed. FINDINGS: The old drain was partially retracted and contrast injection demonstrated contrast out of the pericolonic  collection, presumably in the subcutaneous space. New drain was successfully advanced into the pericolonic collection and Kumpe catheter was even advanced into the colon itself. New 10 French drain was placed within the pericolonic collection. The pericolonic collection is well-formed and could represent a giant diverticulum rather than a true abscess. There is a connection or fistula between the pericolonic collection and the colon. IMPRESSION: 1. Successful exchange and repositioning of the percutaneous drain. The drain was repositioned within the pericolonic collection. There is connection between this pericolonic collection and the colon. 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 2. Large amount of bloody purulent fluid expressed around the drain compatible with the subcutaneous air and fluid seen on the recent CT. Electronically Signed   By: Richarda Overlie M.D.   On: 01/04/2023 09:59   CT ABDOMEN PELVIS W CONTRAST  Result Date: 01/02/2023 CLINICAL DATA:  Status post diverticular abscess drain, drain has pulled back, evaluate position EXAM: CT ABDOMEN AND PELVIS WITH CONTRAST TECHNIQUE: Multidetector CT imaging of the abdomen and pelvis was performed using the standard protocol following bolus administration of intravenous contrast. RADIATION DOSE REDUCTION: This exam was performed according to  the departmental dose-optimization program which includes automated exposure control, adjustment of the mA and/or kV according to patient size and/or use of iterative reconstruction technique. CONTRAST:  75mL OMNIPAQUE IOHEXOL 350 MG/ML SOLN COMPARISON:  12/27/2022 FINDINGS: Lower chest: No acute abnormality. Hepatobiliary: Gallbladder is decompressed. Liver is within normal limits. Pancreas: Unremarkable. No pancreatic ductal dilatation or surrounding inflammatory changes. Spleen: Scattered calcified granulomas are noted. Adrenals/Urinary Tract: Stable right  adrenal lesion likely representing an adenoma. This is unchanged from a prior exam of 2012. No further follow-up is recommended. Left adrenal gland is within normal limits. Kidneys demonstrate a normal enhancement pattern bilaterally. No obstructive changes are seen. The bladder is well distended. Stomach/Bowel: Diverticular change of the colon is noted with thickening in the sigmoid and descending colon. Diverticular abscess is seen and stable. The catheter has withdrawn and only the tip of the catheter remains within the abscess cavity. Air is noted within the abdominal wall and subcutaneous fat consistent with the recent repositioning. More proximal colon is within normal limits. The appendix is unremarkable. Small bowel and stomach are unchanged. Vascular/Lymphatic: Aortic atherosclerosis. No enlarged abdominal or pelvic lymph nodes. Reproductive: Status post hysterectomy. No adnexal masses. Other: No abdominal wall hernia or abnormality. No abdominopelvic ascites. Musculoskeletal: No acute or significant osseous findings. IMPRESSION: Diverticular abscess is again noted in the left lower quadrant. The previously placed dated catheter has been withdrawn significantly and only the tip lies within the abscess cavity. Air and fluid is noted within the abdominal wall likely representing interval leakage from the catheter repositioning. Recommend IR consultation. Electronically Signed   By: Alcide Clever M.D.   On: 01/02/2023 20:34   CT GUIDED PERITONEAL/RETROPERITONEAL FLUID DRAIN BY PERC CATH  Result Date: 12/27/2022 INDICATION: [id number] Colonic diverticular abscess [id number] EXAM: CT-GUIDED DRAIN PLACEMENT INTO PERICOLONIC ABCESS COMPARISON:  CT AP, 12/26/2022 MEDICATIONS: The patient is currently admitted to the hospital and receiving intravenous antibiotics. The antibiotics were administered within an appropriate time frame prior to the initiation of the procedure. ANESTHESIA/SEDATION: Moderate (conscious)  sedation was employed during this procedure. A total of Versed 2 mg and Fentanyl 100 mcg was administered intravenously. Moderate Sedation Time: 22 minutes. The patient's level of consciousness and vital signs were monitored continuously by radiology nursing throughout the procedure under my direct supervision. CONTRAST:  None COMPLICATIONS: None immediate. PROCEDURE: RADIATION DOSE REDUCTION: This exam was performed according to the departmental dose-optimization program which includes automated exposure control, adjustment of the mA and/or kV according to patient size and/or use of iterative reconstruction technique. Informed written consent was obtained from the patient and/or patient's representative after a discussion of the risks, benefits and alternatives to treatment. The patient was placed supine on the CT gantry and a pre procedural CT was performed re-demonstrating the known abscess/fluid collection within the LEFT lower quadrant abdomen. The procedure was planned. A timeout was performed prior to the initiation of the procedure. The LEFT lower quadrant was prepped and draped in the usual sterile fashion. The overlying soft tissues were anesthetized with 1% lidocaine with epinephrine. Appropriate trajectory was planned with the use of a 22 gauge spinal needle. An 18 gauge trocar needle was advanced into the abscess/fluid collection and a short Amplatz super stiff wire was coiled within the collection. Appropriate positioning was confirmed with a limited CT scan. The tract was serially dilated allowing placement of a 12 Fr drainage catheter. Appropriate positioning was confirmed with a limited postprocedural CT scan. 5 mL of purulent fluid was aspirated. The tube was  connected to a drainage bag and sutured in place. A dressing was placed. The patient tolerated the procedure well without immediate post procedural complication. IMPRESSION: Successful CT-guided placement of a 12 Fr percutaneous drainage  catheter into the LEFT lower quadrant abscess with aspiration of 5 mL mL of purulent fluid. Samples were sent to the laboratory as requested by the ordering clinical team. Roanna Banning, MD [facility] Electronically Signed   By: Roanna Banning M.D.   On: 12/27/2022 16:45   CT ABDOMEN PELVIS W CONTRAST  Result Date: 12/26/2022 CLINICAL DATA:  Abdominal pain EXAM: CT ABDOMEN AND PELVIS WITH CONTRAST TECHNIQUE: Multidetector CT imaging of the abdomen and pelvis was performed using the standard protocol following bolus administration of intravenous contrast. RADIATION DOSE REDUCTION: This exam was performed according to the departmental dose-optimization program which includes automated exposure control, adjustment of the mA and/or kV according to patient size and/or use of iterative reconstruction technique. CONTRAST:  70mL OMNIPAQUE IOHEXOL 350 MG/ML SOLN COMPARISON:  04/03/2011 FINDINGS: Lower chest: No acute findings are seen in lower lung fields. Hepatobiliary: No focal abnormalities are seen in liver. There is no dilation of bile ducts. Gallbladder is unremarkable. Pancreas: There is mild prominence of pancreatic duct. No focal abnormalities are seen. Spleen: There are small calcified nodules, possibly granulomas in spleen. Adrenals/Urinary Tract: There is 4 cm nodule in right adrenal with no significant interval change. There is no hydronephrosis. There are no renal or ureteral stones. There is 11 mm smooth marginated fluid density lesion in the upper pole of right kidney suggesting renal cyst. Stomach/Bowel: Stomach is unremarkable. Small bowel loops are not dilated. Appendix is not dilated. Multiple diverticula are seen in colon. There is wall thickening in the distal descending colon and in proximal sigmoid colon. There is 4.2 x 2.6 cm loculated structure in the left margin of junction of descending and sigmoid colon containing pockets of air and  possibly stool. Possibility of contained perforation is not excluded. Perirectal soft tissues are unremarkable. Vascular/Lymphatic: Scattered arterial calcifications are seen. Reproductive: Uterus is not seen.  There are no adnexal masses. Other: There is no ascites or pneumoperitoneum. Small umbilical hernia containing fat is seen. Musculoskeletal: Degenerative changes are noted in lumbar spine, more so at L4-L5 level. IMPRESSION: Diverticulosis of colon. There is abnormal wall thickening and pericolic stranding at the junction of descending and sigmoid colon in left iliac fossa suggesting acute diverticulitis. There is 4.2 x 2.6 cm area of fluid and stool in the left margin of: In left iliac fossa. Possibility of contained perforation with abscess should be considered. Short-term follow-up CT may be considered to re-evaluate this finding. There is no evidence of intestinal obstruction or pneumoperitoneum. There is no hydronephrosis. Appendix is not dilated. There is 4 cm nodule in right adrenal with no significant change suggesting possible adenoma. Other findings as described in the body of the report. Electronically Signed   By: Ernie Avena M.D.   On: 12/26/2022 16:46    Labs:  CBC: Recent Labs    01/01/23 0052 01/02/23 0017 01/03/23 0051 01/04/23 0901  WBC 9.6 9.9 8.3 7.9  HGB 10.1* 10.2* 9.9* 10.1*  HCT 32.0* 32.4* 31.1* 32.4*  PLT 276 302 296 308    COAGS: Recent Labs    12/27/22 1013  INR 1.0    BMP: Recent Labs    01/01/23 0052 01/02/23 0017 01/03/23 0051 01/04/23 0901  NA 137 133* 134* 135  K 3.8 3.6 3.2* 3.5  CL 99 99 101 103  CO2 22 23 24 23   GLUCOSE 127* 178* 262* 155*  BUN 14 16 13  5*  CALCIUM 9.1 8.7* 8.3* 8.5*  CREATININE 1.24* 1.24* 1.19* 0.99  GFRNONAA 47* 47* 50* >60    LIVER FUNCTION TESTS: Recent Labs    12/26/22 1341  BILITOT 0.4  AST 14*  ALT 15  ALKPHOS 79  PROT 7.6  ALBUMIN 3.2*    TUMOR MARKERS: No results for input(s): "AFPTM",  "CEA", "CA199", "CHROMGRNA" in the last 8760 hours.  Assessment and Plan:  My impression is that despite repositioning the catheter has not resulted in any improvement in the diverticular abscess. Conceivably this may represent a giant diverticulum in contiguity with bowel lumen, but scant output suggests tube is not really working currently.  I put a short extension on the drain tubing so patient could reach the connection. I showed her how to flush 5ml NS into catheter and reconnect bag.  Instructed 5ml flush every day x14 days, then keep to gravity drain, record daily outputs. She seemed to understand and was agreeable to plan. We gave her supplies for the weekend and will call more in to her pharmacy.  Repeat CT in 2 weeks. If no progress, may need to discuss surgical options. Pt knows to Call earlier if leaking around tube, questions or problems.  Thank you for this interesting consult.  I greatly enjoyed meeting Jillian Montgomery and look forward to participating in their care.  A copy of this report was sent to the requesting provider on this date.  Electronically Signed: Durwin Glaze 01/19/2023, 4:35 PM   I spent a total of    40 Minutes in  clinical consultation, greater than 50% of which was counseling/coordinating care for diverticular abscess post drain placement.

## 2023-01-20 DIAGNOSIS — Z4803 Encounter for change or removal of drains: Secondary | ICD-10-CM | POA: Diagnosis not present

## 2023-01-20 DIAGNOSIS — E1165 Type 2 diabetes mellitus with hyperglycemia: Secondary | ICD-10-CM | POA: Diagnosis not present

## 2023-01-20 DIAGNOSIS — N1831 Chronic kidney disease, stage 3a: Secondary | ICD-10-CM | POA: Diagnosis not present

## 2023-01-20 DIAGNOSIS — E7849 Other hyperlipidemia: Secondary | ICD-10-CM | POA: Diagnosis not present

## 2023-01-20 DIAGNOSIS — I129 Hypertensive chronic kidney disease with stage 1 through stage 4 chronic kidney disease, or unspecified chronic kidney disease: Secondary | ICD-10-CM | POA: Diagnosis not present

## 2023-01-20 DIAGNOSIS — Z79891 Long term (current) use of opiate analgesic: Secondary | ICD-10-CM | POA: Diagnosis not present

## 2023-01-20 DIAGNOSIS — Z794 Long term (current) use of insulin: Secondary | ICD-10-CM | POA: Diagnosis not present

## 2023-01-20 DIAGNOSIS — K5720 Diverticulitis of large intestine with perforation and abscess without bleeding: Secondary | ICD-10-CM | POA: Diagnosis not present

## 2023-01-20 DIAGNOSIS — E1122 Type 2 diabetes mellitus with diabetic chronic kidney disease: Secondary | ICD-10-CM | POA: Diagnosis not present

## 2023-01-22 ENCOUNTER — Other Ambulatory Visit (HOSPITAL_COMMUNITY): Payer: Self-pay | Admitting: Surgery

## 2023-01-22 ENCOUNTER — Other Ambulatory Visit (HOSPITAL_COMMUNITY): Payer: Self-pay

## 2023-01-22 DIAGNOSIS — K5720 Diverticulitis of large intestine with perforation and abscess without bleeding: Secondary | ICD-10-CM

## 2023-01-24 DIAGNOSIS — Z794 Long term (current) use of insulin: Secondary | ICD-10-CM | POA: Diagnosis not present

## 2023-01-24 DIAGNOSIS — N1831 Chronic kidney disease, stage 3a: Secondary | ICD-10-CM | POA: Diagnosis not present

## 2023-01-24 DIAGNOSIS — I129 Hypertensive chronic kidney disease with stage 1 through stage 4 chronic kidney disease, or unspecified chronic kidney disease: Secondary | ICD-10-CM | POA: Diagnosis not present

## 2023-01-24 DIAGNOSIS — E1122 Type 2 diabetes mellitus with diabetic chronic kidney disease: Secondary | ICD-10-CM | POA: Diagnosis not present

## 2023-01-24 DIAGNOSIS — Z4803 Encounter for change or removal of drains: Secondary | ICD-10-CM | POA: Diagnosis not present

## 2023-01-24 DIAGNOSIS — E7849 Other hyperlipidemia: Secondary | ICD-10-CM | POA: Diagnosis not present

## 2023-01-24 DIAGNOSIS — K5720 Diverticulitis of large intestine with perforation and abscess without bleeding: Secondary | ICD-10-CM | POA: Diagnosis not present

## 2023-01-24 DIAGNOSIS — Z79891 Long term (current) use of opiate analgesic: Secondary | ICD-10-CM | POA: Diagnosis not present

## 2023-01-24 DIAGNOSIS — E1165 Type 2 diabetes mellitus with hyperglycemia: Secondary | ICD-10-CM | POA: Diagnosis not present

## 2023-01-25 DIAGNOSIS — H04123 Dry eye syndrome of bilateral lacrimal glands: Secondary | ICD-10-CM | POA: Diagnosis not present

## 2023-02-01 DIAGNOSIS — E1122 Type 2 diabetes mellitus with diabetic chronic kidney disease: Secondary | ICD-10-CM | POA: Diagnosis not present

## 2023-02-01 DIAGNOSIS — K5720 Diverticulitis of large intestine with perforation and abscess without bleeding: Secondary | ICD-10-CM | POA: Diagnosis not present

## 2023-02-01 DIAGNOSIS — Z794 Long term (current) use of insulin: Secondary | ICD-10-CM | POA: Diagnosis not present

## 2023-02-01 DIAGNOSIS — I129 Hypertensive chronic kidney disease with stage 1 through stage 4 chronic kidney disease, or unspecified chronic kidney disease: Secondary | ICD-10-CM | POA: Diagnosis not present

## 2023-02-01 DIAGNOSIS — E7849 Other hyperlipidemia: Secondary | ICD-10-CM | POA: Diagnosis not present

## 2023-02-01 DIAGNOSIS — N1831 Chronic kidney disease, stage 3a: Secondary | ICD-10-CM | POA: Diagnosis not present

## 2023-02-01 DIAGNOSIS — Z79891 Long term (current) use of opiate analgesic: Secondary | ICD-10-CM | POA: Diagnosis not present

## 2023-02-01 DIAGNOSIS — E1165 Type 2 diabetes mellitus with hyperglycemia: Secondary | ICD-10-CM | POA: Diagnosis not present

## 2023-02-01 DIAGNOSIS — Z4803 Encounter for change or removal of drains: Secondary | ICD-10-CM | POA: Diagnosis not present

## 2023-02-02 ENCOUNTER — Ambulatory Visit
Admission: RE | Admit: 2023-02-02 | Discharge: 2023-02-02 | Disposition: A | Discharge status: Home / Self Care | Payer: Medicare HMO | Source: Ambulatory Visit | Attending: Surgery | Admitting: Surgery

## 2023-02-02 DIAGNOSIS — K630 Abscess of intestine: Secondary | ICD-10-CM | POA: Diagnosis not present

## 2023-02-02 DIAGNOSIS — K5720 Diverticulitis of large intestine with perforation and abscess without bleeding: Secondary | ICD-10-CM

## 2023-02-02 MED ORDER — IOPAMIDOL (ISOVUE-300) INJECTION 61%
100.0000 mL | Freq: Once | INTRAVENOUS | Status: AC | PRN
  Administered 2023-02-02: 100 mL via INTRAVENOUS

## 2023-02-02 NOTE — Progress Notes (Signed)
Chief Complaint: Patient was seen in consultation today for drain follow up  at the request of White,Christopher M  Referring Physician(s): White,Christopher M  History of Present Illness: Jillian Montgomery is a 67 y.o. female with history of colonic diverticulitis and placement of a drain into a pericolonic abscess on 12/27/22.  Patient had the drain repositioned on 01/03/23 because the drain was retracted and patient developed a subcutaneous fluid collection that was expressed at the time of the drain exchange.  Patient had minimal output at last visit on 01/19/23 and we instructed her to flush the drain regularly.  Patient again reports minimal output.  She continues to be very tender at the drain site.  Home health nurse is coming once a week to help with dressings and flushing.  Patient has been flushing daily.  She is scheduled to have a colonoscopy in 1-2 weeks and then follow up with General surgery after the colonoscopy  Past Medical History:  Diagnosis Date   Chronic kidney disease, stage III (moderate) (HCC)    Other and unspecified hyperlipidemia    Type II or unspecified type diabetes mellitus without mention of complication, not stated as uncontrolled    Unspecified essential hypertension     Past Surgical History:  Procedure Laterality Date   IR CATHETER TUBE CHANGE  01/03/2023   IR RADIOLOGIST EVAL & MGMT  01/19/2023    Allergies: Lisinopril and Metformin  Medications: Prior to Admission medications   Medication Sig Start Date End Date Taking? Authorizing Provider  atorvastatin (LIPITOR) 20 MG tablet Take 20 mg by mouth daily. 09/05/17   [provider]  insulin detemir (LEVEMIR) 100 UNIT/ML injection Inject 0.1 mLs (10 Units total) into the skin at bedtime. 01/04/23   Rolly Salter, MD  losartan (COZAAR) 100 MG tablet Take 100 mg by mouth daily.    [provider]  ns flush (NS FLUSH) 0.9% SOLN Flush once daily with 5 ml. 01/19/23      oxyCODONE-acetaminophen (PERCOCET/ROXICET) 5-325 MG tablet Take 1 tablet by mouth every 6 (six) hours as needed for severe pain or moderate pain. 01/04/23   Rolly Salter, MD  polyethylene glycol powder (GLYCOLAX/MIRALAX) 17 GM/SCOOP powder Mix 1 capful (17 g total) in 8 oz clear liquid and drink by mouth once daily. 01/05/23   Rolly Salter, MD     Family History  Problem Relation Age of Onset   Breast cancer Mother     Social History   Socioeconomic History   Marital status: Single    Spouse name: Not on file   Number of children: Not on file   Years of education: Not on file   Highest education level: Not on file  Occupational History   Not on file  Tobacco Use   Smoking status: Never   Smokeless tobacco: Never  Substance and Sexual Activity   Alcohol use: Not on file   Drug use: Not on file   Sexual activity: Not on file  Other Topics Concern   Not on file  Social History Narrative   Not on file   Social Determinants of Health   Financial Resource Strain: Not on file  Food Insecurity: No Food Insecurity (12/26/2022)   Hunger Vital Sign    Worried About Running Out of Food in the Last Year: Never true    Ran Out of Food in the Last Year: Never true  Transportation Needs: No Transportation Needs (12/26/2022)   PRAPARE - Transportation  Lack of Transportation (Medical): No    Lack of Transportation (Non-Medical): No  Physical Activity: Not on file  Stress: Not on file  Social Connections: Not on file     Review of Systems  Constitutional: Negative.   Gastrointestinal:  Positive for abdominal distention.    Vital Signs: BP (!) 169/84 (BP Location: Right Arm)   Pulse 63   Resp 20   SpO2 100%    Physical Exam Eyes:     General: Scleral icterus present.  Pulmonary:     Effort: Pulmonary effort is normal.  Abdominal:     Comments: Left abdominal drain is intact.  Granulation tissue at drain insertion site associated with drain suture.    Minimal  fluid in drain bag.  Drain flushes easily but cannot aspirate much fluid.   Neurological:     Mental Status: She is alert.        Imaging: CT ABDOMEN PELVIS W CONTRAST  Result Date: 02/02/2023 CLINICAL DATA:  67 year old with history of diverticulitis and treated for a pericolonic abscess. Percutaneous drain was placed on 12/27/2022. The drain was exchanged and repositioned on 01/03/2023. Patient reports minimal output from the drain. EXAM: CT ABDOMEN AND PELVIS WITH CONTRAST TECHNIQUE: Multidetector CT imaging of the abdomen and pelvis was performed using the standard protocol following bolus administration of intravenous contrast. RADIATION DOSE REDUCTION: This exam was performed according to the departmental dose-optimization program which includes automated exposure control, adjustment of the mA and/or kV according to patient size and/or use of iterative reconstruction technique. CONTRAST:  ISOVUE-300 IOPAMIDOL (ISOVUE-300) INJECTION 61% COMPARISON:  CT 01/19/2023 FINDINGS: Lower chest: Lung bases are clear. Again noted is a punctate nodular density in the right middle lobe on image 6/5 measuring approximately 2 mm. Hepatobiliary: Normal appearance of the liver, gallbladder and portal venous system. No biliary dilatation. Pancreas: Unremarkable. No pancreatic ductal dilatation or surrounding inflammatory changes. Spleen: Normal in size without focal abnormality. Adrenals/Urinary Tract: Stable right adrenal mass measuring up to 4.1 cm. This mass is minimally changed since 2012. Evidence for low-density cyst in the central aspect of the right kidney. Normal left adrenal gland. No hydronephrosis. Normal appearance of the urinary bladder. Stomach/Bowel: Normal appearance of the stomach. No small bowel dilatation. Persistent colonic inflammatory changes at the junction of the sigmoid colon and descending colon. Again noted is a complex air-fluid collection associated with the colon at the sigmoid  and descending colon junction. This collection measures 3.9 x 3.3 x 4.3 cm and previously measured 3.5 x 2.4 x 3.7 cm. Stable position of the drain within this collection. This collection could represent a large colonic diverticulum versus a chronic thick-walled abscess. Mild wall thickening in the colon just beyond the drain site. Normal appendix. Vascular/Lymphatic: Atherosclerotic calcifications involving the abdominal aorta without aneurysm. Main visceral arteries are patent. Mildly prominent left periaortic lymph nodes and a prominent lymph node in the left lower abdominal mesentery on image 41/3 measuring 0.9 cm in the short axis. The small abdominal lymph nodes are similar to the prior examination and nonspecific. Reproductive: Hysterectomy. Mild fullness involving the left adnexa is unchanged and may be related to the pericolonic inflammation. Other: Pigtail drain situated within the pericolonic collection drain has not significantly changed in position. Again noted is some gas tracking between the left abdominal oblique musculature. The gas in this region has decreased since the previous examination. No discrete fluid collections within the left abdominal musculature. Persistence edema or inflammatory changes in the left abdominal musculature near  the drain. No evidence for free fluid. Negative for free air. Small umbilical hernia containing fat. Musculoskeletal: Sclerosis at the symphysis pubis. No acute bone abnormality. IMPRESSION: 1. Persistent colonic inflammatory changes at the junction of the sigmoid and descending colon. Air-fluid collection associated with this colonic inflammation has enlarged in size despite having a drain in the collection. Findings compatible with persistent diverticulitis. The large pericolonic collection could represent a large colonic diverticulum versus a chronic thick-walled pericolonic abscess. 2. Mildly prominent abdominal lymph nodes are nonspecific but could be  reactive. 3. Persistent edema and inflammatory changes in the left abdominal musculature near the drain. The gas tracking between the left abdominal oblique musculature has decreased since the previous examination. 4. Stable right adrenal mass. This mass is minimally changed since 2012 and most likely benign. 5. Stable punctate nodular density in the right middle lobe. No follow-up needed if patient is low-risk. Non-contrast chest CT can be considered in 12 months if patient is high-risk. This recommendation follows the consensus statement: Guidelines for Management of Incidental Pulmonary Nodules Detected on CT Images: From the Fleischner Society 2017; Radiology 2017; 284:228-243. 6. Aortic Atherosclerosis (ICD10-I70.0). Electronically Signed   By: Richarda Overlie M.D.   On: 02/02/2023 16:07   IR Radiologist Eval & Mgmt  Result Date: 01/19/2023 : Chief Complaint:Sigmoid diverticulitis with contained perforation; Referring Physician(s):Chris White MDHistory of Present Illness:Jillian Montgomery is a 67 y.o. female 12/26/22 presented with diverticular abscess6/12/24 CT drain placed6/18/24 CT showed drain had partially retracted and collection persisted6/19/24 catheter change and repositioningLittle output since then. No flushing regimen. Mild persistent low abd pain.CT today shows little improvement in abscess, no complicating features. Past Medical History: Diagnosis * : Date . * : Chronic kidney disease, stage III (moderate) (HCC) * : . * : Other and unspecified hyperlipidemia * : . * : Type II or unspecified type diabetes mellitus without mention of complication, not stated as uncontrolled * : . * : Unspecified essential hypertension * : Past Surgical History: Procedure * : Laterality * : Date . * : IR CATHETER TUBE CHANGE * : * : 01/03/2023 Allergies:Lisinopril and MetforminMedications: Prior to Admission medications Medication * : Sig * : Start Date * : End Date * : Taking? * : Authorizing Provider atorvastatin  (LIPITOR) 20 MG tablet * : Take 20 mg by mouth daily. * : 09/05/17 * : * : * : [provider] insulin detemir (LEVEMIR) 100 UNIT/ML injection * : Inject 0.1 mLs (10 Units total) into the skin at bedtime. * : 01/04/23 * : * : * Rolly Salter, MD losartan (COZAAR) 100 MG tablet * : Take 100 mg by mouth daily. * : * : * : * : [provider] ns flush (NS FLUSH) 0.9% SOLN * : Flush once daily with 5 ml. * : 01/19/23 * : * : * : oxyCODONE-acetaminophen (PERCOCET/ROXICET) 5-325 MG tablet * : Take 1 tablet by mouth every 6 (six) hours as needed for severe pain or moderate pain. * : 01/04/23 * : * : * Rolly Salter, MD polyethylene glycol powder (GLYCOLAX/MIRALAX) 17 GM/SCOOP powder * : Mix 1 capful (17 g total) in 8 oz clear liquid and drink by mouth once daily. * : 01/05/23 * : * : * Rolly Salter, MD Family History Problem * : Relation * : Age of Onset . * : Breast cancer * : Mother * : Socioeconomic History . * : Marital status: * : Single * : * :  Spouse name: * : Not on file . * : Number of children: * : Not on file . * : Years of education: * : Not on file . * : Highest education level: * : Not on file Occupational History . * : Not on file Tobacco Use . * : Smoking status: * : Never . * : Smokeless tobacco: * : Never Substance and Sexual Activity . * : Alcohol use: * : Not on file . * : Drug use: * : Not on file . * : Sexual activity: * : Not on file Other Topics * : Concern . * : Not on file Social History Narrative . * : Not on file Financial Resource Strain: Not on file Food Insecurity: No Food Insecurity (12/26/2022) * : Hunger Vital Sign * : . * : Worried About Running Out of Food in the Last Year: Never true * : . * : Ran Out of Food in the Last Year: Never true Transportation Needs: No Transportation Needs (12/26/2022) * : PRAPARE - Transportation * : . * : Lack of Transportation (Medical): No * : . * : Lack of Transportation (Non-Medical): No Physical Activity: Not on file Stress: Not  on file Social Connections: Not on file ECOG Status:1 - Symptomatic but completely ambulatoryReview of Systems: A 12 point ROS discussed and pertinent positives are indicated in the HPI above. All other systems are negative.Review of SystemsVital Signs:BP 138/63 (BP Location: Right Arm)  Pulse 88  Temp 98.3 F (36.8 C) (Oral)  Resp 20  SpO2 100% Physical ExamDrain site c/d/I. Fecal appearing residual content in drain bag (not enough to drain out valve).Tube flushed easily with 5ml NS although this was uncomfortable for patient at brisk rate, tolerated more slowly. Imaging: IR Catheter Tube Change Result Date: 6/20/2024INDICATION: 67 year old with history of diverticulitis and percutaneous drain was placed in a suspected diverticular abscess on 12/27/2022. Recent CT demonstrates that the percutaneous drain has been retracted and there is now large amount of gas within the left flank subcutaneous tissues. EXAM: 1. Drain injection 2. Drain exchange/repositioning with fluoroscopy MEDICATIONS: Moderate sedation ANESTHESIA/SEDATION: Moderate (conscious) sedation was employed during this procedure. A total of Versed 4mg  and fentanyl 100 mcg was administered intravenously at the order of the provider performing the procedure. Total intra-service moderate sedation time: 22 minutes. Patient's level of consciousness and vital signs were monitored continuously by radiology nurse throughout the procedure under the supervision of the provider performing the procedure. FLUOROSCOPY TIME: Radiation Exposure Index (as provided by the fluoroscopic device): 23 mGy Kerma CONTRAST: 10 mL Omnipaque 300 COMPLICATIONS: None immediate. PROCEDURE: Informed written consent was obtained from the patient after a thorough discussion of the procedural risks, benefits and alternatives. All questions were addressed.A timeout was performed prior to the initiation of the procedure. Patient was placed supine. The existing catheter and left abdomen  were prepped and draped in sterile fashion. Maximal barrier sterile technique was utilized including caps, mask, sterile gowns, sterile gloves, sterile drape, hand hygiene and skin antiseptic. Drain was injected with contrast. The retention suture was cut. The catheter was cut and removed over a Bentson wire. A Kumpe catheter was successfully advanced into the pericolonic collection. A 10 French Dawson Mueller drain was advanced over the wire and reconstituted within the collection. Additional contrast was injected to confirm placement. In addition, a large amount of bloody purulent fluid was expressed around the drainage catheter. New drain was secured to skin with suture and attached to a gravity bag.  Dressing was placed. FINDINGS: The old drain was partially retracted and contrast injection demonstrated contrast out of the pericolonic collection, presumably in the subcutaneous space. New drain was successfully advanced into the pericolonic collection and Kumpe catheter was even advanced into the colon itself. New 10 French drain was placed within the pericolonic collection. The pericolonic collection is well-formed and could represent a giant diverticulum rather than a true abscess. There is a connection or fistula between the pericolonic collection and the colon. IMPRESSION: 1. Successful exchange and repositioning of the percutaneous drain. The drain was repositioned within the pericolonic collection. There is connection between this pericolonic collection and the colon. 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 2. Large amount of bloody purulent fluid expressed around the drain compatible with the subcutaneous air and fluid seen on the recent CT. Electronically Signed By: Richarda Overlie M.D. On: 01/04/2023 09:59 CT ABDOMEN PELVIS W CONTRAST Result Date: 6/18/2024CLINICAL DATA: Status post diverticular abscess drain, drain has pulled back, evaluate position  EXAM: CT ABDOMEN AND PELVIS WITH CONTRAST TECHNIQUE: Multidetector CT imaging of the abdomen and pelvis was performed using the standard protocol following bolus administration of intravenous contrast. RADIATION DOSE REDUCTION: This exam was performed according to the departmental dose-optimization program which includes automated exposure control, adjustment of the mA and/or kV according to patient size and/or use of iterative reconstruction technique. CONTRAST: 75mL OMNIPAQUE IOHEXOL 350 MG/ML SOLN COMPARISON: 12/27/2022 FINDINGS: Lower chest: No acute abnormality. Hepatobiliary: Gallbladder is decompressed. Liver is within normal limits. Pancreas: Unremarkable. No pancreatic ductal dilatation or surrounding inflammatory changes. Spleen: Scattered calcified granulomas are noted. Adrenals/Urinary Tract: Stable right adrenal lesion likely representing an adenoma. This is unchanged from a prior exam of 2012. No further follow-up is recommended. Left adrenal gland is within normal limits. Kidneys demonstrate a normal enhancement pattern bilaterally. No obstructive changes are seen. The bladder is well distended. Stomach/Bowel: Diverticular change of the colon is noted with thickening in the sigmoid and descending colon. Diverticular abscess is seen and stable. The catheter has withdrawn and only the tip of the catheter remains within the abscess cavity. Air is noted within the abdominal wall and subcutaneous fat consistent with the recent repositioning. More proximal colon is within normal limits. The appendix is unremarkable. Small bowel and stomach are unchanged. Vascular/Lymphatic: Aortic atherosclerosis. No enlarged abdominal or pelvic lymph nodes. Reproductive: Status post hysterectomy. No adnexal masses. Other: No abdominal wall hernia or abnormality. No abdominopelvic ascites. Musculoskeletal: No acute or significant osseous findings. IMPRESSION: Diverticular abscess is again noted in the left lower quadrant.  The previously placed dated catheter has been withdrawn significantly and only the tip lies within the abscess cavity. Air and fluid is noted within the abdominal wall likely representing interval leakage from the catheter repositioning. Recommend IR consultation. Electronically Signed By: Alcide Clever M.D. On: 01/02/2023 20:34 CT GUIDED PERITONEAL/RETROPERITONEAL FLUID DRAIN BY [facility] CATH Result Date: 6/12/2024INDICATION: [id number] Colonic diverticular abscess [id number] EXAM: CT-GUIDED DRAIN PLACEMENT INTO PERICOLONIC ABCESS COMPARISON: CT AP, 12/26/2022 MEDICATIONS: The patient is currently admitted to the hospital and receiving intravenous antibiotics. The antibiotics were administered within an appropriate time frame prior to the initiation of the procedure. ANESTHESIA/SEDATION: Moderate (conscious) sedation was employed during this procedure. A total of Versed 2 mg and Fentanyl 100 mcg was administered intravenously. Moderate Sedation Time: 22 minutes. The patient's level of consciousness and vital signs were monitored continuously by radiology nursing throughout the procedure under my direct supervision. CONTRAST: None COMPLICATIONS:  None immediate. PROCEDURE: RADIATION DOSE REDUCTION: This exam was performed according to the departmental dose-optimization program which includes automated exposure control, adjustment of the mA and/or kV according to patient size and/or use of iterative reconstruction technique. Informed written consent was obtained from the patient and/or patient's representative after a discussion of the risks, benefits and alternatives to treatment. The patient was placed supine on the CT gantry and a pre procedural CT was performed re-demonstrating the known abscess/fluid collection within the LEFT lower quadrant abdomen. The procedure was planned. A timeout was performed prior to the initiation of the procedure. The LEFT lower quadrant was prepped and draped in the usual sterile fashion. The  overlying soft tissues were anesthetized with 1% lidocaine with epinephrine. Appropriate trajectory was planned with the use of a 22 gauge spinal needle. An 18 gauge trocar needle was advanced into the abscess/fluid collection and a short Amplatz super stiff wire was coiled within the collection. Appropriate positioning was confirmed with a limited CT scan. The tract was serially dilated allowing placement of a 12 Fr drainage catheter. Appropriate positioning was confirmed with a limited postprocedural CT scan. 5 mL of purulent fluid was aspirated. The tube was connected to a drainage bag and sutured in place. A dressing was placed. The patient tolerated the procedure well without immediate post procedural complication. IMPRESSION: Successful CT-guided placement of a 12 Fr percutaneous drainage catheter into the LEFT lower quadrant abscess with aspiration of 5 mL mL of purulent fluid. Samples were sent to the laboratory as requested by the ordering clinical team. Roanna Banning, MD [facility] Electronically Signed By: Roanna Banning M.D. On: 12/27/2022 16:45 CT ABDOMEN PELVIS W CONTRAST Result Date: 6/11/2024CLINICAL DATA: Abdominal pain EXAM: CT ABDOMEN AND PELVIS WITH CONTRAST TECHNIQUE: Multidetector CT imaging of the abdomen and pelvis was performed using the standard protocol following bolus administration of intravenous contrast. RADIATION DOSE REDUCTION: This exam was performed according to the departmental dose-optimization program which includes automated exposure control, adjustment of the mA and/or kV according to patient size and/or use of iterative reconstruction technique. CONTRAST: 70mL OMNIPAQUE IOHEXOL 350 MG/ML SOLN COMPARISON: 04/03/2011 FINDINGS: Lower chest: No acute findings are seen in lower lung fields. Hepatobiliary: No focal abnormalities are seen in liver. There is no dilation of bile ducts. Gallbladder is unremarkable. Pancreas: There  is mild prominence of pancreatic duct. No focal abnormalities are seen. Spleen: There are small calcified nodules, possibly granulomas in spleen. Adrenals/Urinary Tract: There is 4 cm nodule in right adrenal with no significant interval change. There is no hydronephrosis. There are no renal or ureteral stones. There is 11 mm smooth marginated fluid density lesion in the upper pole of right kidney suggesting renal cyst. Stomach/Bowel: Stomach is unremarkable. Small bowel loops are not dilated. Appendix is not dilated. Multiple diverticula are seen in colon. There is wall thickening in the distal descending colon and in proximal sigmoid colon. There is 4.2 x 2.6 cm loculated structure in the left margin of junction of descending and sigmoid colon containing pockets of air and possibly stool. Possibility of contained perforation is not excluded. Perirectal soft tissues are unremarkable. Vascular/Lymphatic: Scattered arterial calcifications are seen. Reproductive: Uterus is not seen. There are no adnexal masses. Other: There is no ascites or pneumoperitoneum. Small umbilical hernia containing fat is seen. Musculoskeletal: Degenerative changes are noted in lumbar spine, more so at L4-L5 level. IMPRESSION: Diverticulosis of colon. There is abnormal wall thickening and pericolic stranding at the junction of descending and  sigmoid colon in left iliac fossa suggesting acute diverticulitis. There is 4.2 x 2.6 cm area of fluid and stool in the left margin of: In left iliac fossa. Possibility of contained perforation with abscess should be considered. Short-term follow-up CT may be considered to re-evaluate this finding. There is no evidence of intestinal obstruction or pneumoperitoneum. There is no hydronephrosis. Appendix is not dilated. There is 4 cm nodule in right adrenal with no significant change suggesting possible adenoma. Other findings as described in the body of the report. Electronically Signed By: Ernie Avena M.D. On: 12/26/2022 16:46 Labs:CBC: Recent Labs 06/17/24005206/18/24001706/19/24005106/20/240901 ZOX0.96.04.54.0JWJ19.1*47.8*2.9*56.2*ZHY86.5*78.4*69.6*29.5*[id number] COAGS: Recent Labs 06/12/241013 INR1.0 BMP: Recent Labs 06/17/24005206/18/24001706/19/24005106/20/240901 [id number]*742*595G3.87.56.4*[id number] GLUCOSE127*178*262*155*[id number]*CALCIUM9.18.7*8.3*8.5*CREATININE1.24*1.24*1.19*0.99GFRNONAA47*47*50*>60 LIVER FUNCTION TESTS: Recent Labs 06/11/241341 BILITOT0.4AST14*ALT15ALKPHOS79PROT7.6ALBUMIN3.2* TUMOR MARKERS: No results for input(s): "AFPTM", "CEA", "CA199", "CHROMGRNA" in the last 8760 hours. Assessment and Plan:My impression is that despite repositioning the catheter has not resulted in any improvement in the diverticular abscess. Conceivably this may represent a giant diverticulum in contiguity with bowel lumen, but scant output suggests tube is not really working currently. I put a short extension on the drain tubing so patient could reach the connection. I showed her how to flush 5ml NS into catheter and reconnect bag. Instructed 5ml flush every day x14 days, then keep to gravity drain, record daily outputs. She seemed to understand and was agreeable to plan. We gave her supplies for the weekend and will call more in to her pharmacy. Repeat CT in 2 weeks. If no progress, may need to discuss surgical options. Pt knows to Call earlier if leaking around tube, questions or problems.Thank you for this interesting consult. I greatly enjoyed meeting Jillian Montgomery and look forward to participating in their care. A copy of this report was sent to the requesting provider on this date.Electronically Signed:Dayne Reuel Boom Hassell7/11/2022, 4:35 PMI spent a total of 40 Minutes in clinical consultation, greater than 50% of which was counseling/coordinating care for diverticular abscess post drain placement. Electronically Signed   By: Corlis Leak M.D.   On: 01/19/2023 16:48   CT  ABDOMEN PELVIS W CONTRAST  Result Date: 01/19/2023 CLINICAL DATA:  Diverticular abscess, post drain catheter placement 12/27/2022. Catheter required repositioning 01/03/2023. Low output from the drain. She is not flushing the drain. EXAM: CT ABDOMEN AND PELVIS WITH CONTRAST TECHNIQUE: Multidetector CT imaging of the abdomen and pelvis was performed using the standard protocol following bolus administration of intravenous contrast. RADIATION DOSE REDUCTION: This exam was performed according to the departmental dose-optimization program which includes automated exposure control, adjustment of the mA and/or kV according to patient size and/or use of iterative reconstruction technique. CONTRAST:  ISOVUE-300 IOPAMIDOL (ISOVUE-300) INJECTION 61% COMPARISON:  01/02/2023 and previous FINDINGS: Lower chest: No pleural or pericardial effusion. Visualized lung bases clear. Hepatobiliary: 5 mm calcified gallstone in the nondistended gallbladder. No focal liver lesion or biliary ductal dilatation. Pancreas: Unremarkable. No pancreatic ductal dilatation or surrounding inflammatory changes. Spleen: Normal in size, with a few scattered calcified granulomas. Adrenals/Urinary Tract: 4.1 cm right adrenal mass, present since 04/03/2011 consistent with benign process. Symmetric renal parenchymal enhancement without urolithiasis or hydronephrosis. Urinary bladder incompletely distended. Stomach/Bowel: Stomach is partially distended by ingested material without acute finding. Small bowel decompressed. Normal appendix. The colon is incompletely distended. Scattered transverse, descending, and sigmoid diverticula. Drain catheter remains position in a complex peripherally enhancing 3.7 cm collection at the lateral margin of the distal descending colon (previously 3.6). There is circumferential colonic wall thickening, and there are regional inflammatory/edematous changes in the retroperitoneum as before. No new  or undrained  collection. Vascular/Lymphatic: Scattered aortoiliac calcified atheromatous plaque without aneurysm. Portal vein patent. No abdominal or pelvic adenopathy. Reproductive: Status post hysterectomy. No adnexal masses. Other: Pelvic phleboliths. No ascites. No free air. There is some gas dissecting through muscular planes in the in the left lateral body wall cephalad to the drain catheter to the inferior costal margin, decreased since previous. Musculoskeletal: Arthrodesis of posterior elements across multiple contiguous lower thoracic and upper lumbar segments. No fracture or worrisome bone lesion. IMPRESSION: 1. No improvement in 3.7 cm pericolonic abscess adjacent despite catheter repositioning. No new or undrained collection. 2. Colonic diverticulosis. Electronically Signed   By: Corlis Leak M.D.   On: 01/19/2023 16:34   IR Catheter Tube Change  Result Date: 01/04/2023 INDICATION: 67 year old with history of diverticulitis and percutaneous drain was placed in a suspected diverticular abscess on 12/27/2022. Recent CT demonstrates that the percutaneous drain has been retracted and there is now large amount of gas within the left flank subcutaneous tissues. EXAM: 1. Drain injection 2. Drain exchange/repositioning with fluoroscopy MEDICATIONS: Moderate sedation ANESTHESIA/SEDATION: Moderate (conscious) sedation was employed during this procedure. A total of Versed 4mg  and fentanyl 100 mcg was administered intravenously at the order of the provider performing the procedure. Total intra-service moderate sedation time: 22 minutes. Patient's level of consciousness and vital signs were monitored continuously by radiology nurse throughout the procedure under the supervision of the provider performing the procedure. FLUOROSCOPY TIME:  Radiation Exposure Index (as provided by the fluoroscopic device): 23 mGy Kerma CONTRAST:  10 mL Omnipaque 300 COMPLICATIONS: None immediate. PROCEDURE: Informed written consent was obtained  from the patient after a thorough discussion of the procedural risks, benefits and alternatives. All questions were addressed.A timeout was performed prior to the initiation of the procedure. Patient was placed supine. The existing catheter and left abdomen were prepped and draped in sterile fashion. Maximal barrier sterile technique was utilized including caps, mask, sterile gowns, sterile gloves, sterile drape, hand hygiene and skin antiseptic. Drain was injected with contrast. The retention suture was cut. The catheter was cut and removed over a Bentson wire. A Kumpe catheter was successfully advanced into the pericolonic collection. A 10 French Dawson Mueller drain was advanced over the wire and reconstituted within the collection. Additional contrast was injected to confirm placement. In addition, a large amount of bloody purulent fluid was expressed around the drainage catheter. New drain was secured to skin with suture and attached to a gravity bag. Dressing was placed. FINDINGS: The old drain was partially retracted and contrast injection demonstrated contrast out of the pericolonic collection, presumably in the subcutaneous space. New drain was successfully advanced into the pericolonic collection and Kumpe catheter was even advanced into the colon itself. New 10 French drain was placed within the pericolonic collection. The pericolonic collection is well-formed and could represent a giant diverticulum rather than a true abscess. There is a connection or fistula between the pericolonic collection and the colon. IMPRESSION: 1. Successful exchange and repositioning of the percutaneous drain. The drain was repositioned within the pericolonic collection. There is connection between this pericolonic collection and the colon. 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 2. Large amount of bloody purulent fluid expressed around the drain compatible with the  subcutaneous air and fluid seen on the recent CT. Electronically Signed   By: Richarda Overlie M.D.   On: 01/04/2023 09:59    Labs:  CBC: Recent Labs    01/01/23  4098 01/02/23 0017 01/03/23 0051 01/04/23 0901  WBC 9.6 9.9 8.3 7.9  HGB 10.1* 10.2* 9.9* 10.1*  HCT 32.0* 32.4* 31.1* 32.4*  PLT 276 302 296 308    COAGS: Recent Labs    12/27/22 1013  INR 1.0    BMP: Recent Labs    01/01/23 0052 01/02/23 0017 01/03/23 0051 01/04/23 0901  NA 137 133* 134* 135  K 3.8 3.6 3.2* 3.5  CL 99 99 101 103  CO2 22 23 24 23   GLUCOSE 127* 178* 262* 155*  BUN 14 16 13  5*  CALCIUM 9.1 8.7* 8.3* 8.5*  CREATININE 1.24* 1.24* 1.19* 0.99  GFRNONAA 47* 47* 50* >60    LIVER FUNCTION TESTS: Recent Labs    12/26/22 1341  BILITOT 0.4  AST 14*  ALT 15  ALKPHOS 79  PROT 7.6  ALBUMIN 3.2*    TUMOR MARKERS: No results for input(s): "AFPTM", "CEA", "CA199", "CHROMGRNA" in the last 8760 hours.  Assessment and Plan:  67 year old with colonic diverticulitis and status post drain placement.  Today's CT demonstrates persistent colonic inflammation and the pericolonic collection has actually increased in size despite adequate position of the drain and regular flushing.  Drain injection again shows a connection between the drain collection and the colon.  I suspect the drain is positioned within a large colonic diverticulum but it could be in a chronic thick wall pericolonic abscess with a fistula.  Either way, I feel the patient has failed conservative management with a drain.  Patient will continue to flush the drain but we will defer management to General Surgery after her colonoscopy.  No plans for additional IR follow up at this time.     Electronically Signed: Arn Medal 02/02/2023, 4:14 PM   I spent a total of    10 Minutes in face to face in clinical consultation, greater than 50% of which was counseling/coordinating care for drain management.  Patient ID: Jillian Montgomery, female   DOB:  14-Mar-1956, 67 y.o.   MRN: [id number]

## 2023-02-06 DIAGNOSIS — I129 Hypertensive chronic kidney disease with stage 1 through stage 4 chronic kidney disease, or unspecified chronic kidney disease: Secondary | ICD-10-CM | POA: Diagnosis not present

## 2023-02-06 DIAGNOSIS — Z4803 Encounter for change or removal of drains: Secondary | ICD-10-CM | POA: Diagnosis not present

## 2023-02-06 DIAGNOSIS — E1122 Type 2 diabetes mellitus with diabetic chronic kidney disease: Secondary | ICD-10-CM | POA: Diagnosis not present

## 2023-02-06 DIAGNOSIS — E1165 Type 2 diabetes mellitus with hyperglycemia: Secondary | ICD-10-CM | POA: Diagnosis not present

## 2023-02-06 DIAGNOSIS — N1831 Chronic kidney disease, stage 3a: Secondary | ICD-10-CM | POA: Diagnosis not present

## 2023-02-06 DIAGNOSIS — K5720 Diverticulitis of large intestine with perforation and abscess without bleeding: Secondary | ICD-10-CM | POA: Diagnosis not present

## 2023-02-06 DIAGNOSIS — E7849 Other hyperlipidemia: Secondary | ICD-10-CM | POA: Diagnosis not present

## 2023-02-06 DIAGNOSIS — Z79891 Long term (current) use of opiate analgesic: Secondary | ICD-10-CM | POA: Diagnosis not present

## 2023-02-06 DIAGNOSIS — Z794 Long term (current) use of insulin: Secondary | ICD-10-CM | POA: Diagnosis not present

## 2023-02-08 DIAGNOSIS — E1165 Type 2 diabetes mellitus with hyperglycemia: Secondary | ICD-10-CM | POA: Diagnosis not present

## 2023-02-08 DIAGNOSIS — I129 Hypertensive chronic kidney disease with stage 1 through stage 4 chronic kidney disease, or unspecified chronic kidney disease: Secondary | ICD-10-CM | POA: Diagnosis not present

## 2023-02-08 DIAGNOSIS — E1122 Type 2 diabetes mellitus with diabetic chronic kidney disease: Secondary | ICD-10-CM | POA: Diagnosis not present

## 2023-02-08 DIAGNOSIS — K5720 Diverticulitis of large intestine with perforation and abscess without bleeding: Secondary | ICD-10-CM | POA: Diagnosis not present

## 2023-02-08 DIAGNOSIS — Z4803 Encounter for change or removal of drains: Secondary | ICD-10-CM | POA: Diagnosis not present

## 2023-02-08 DIAGNOSIS — Z79891 Long term (current) use of opiate analgesic: Secondary | ICD-10-CM | POA: Diagnosis not present

## 2023-02-08 DIAGNOSIS — E7849 Other hyperlipidemia: Secondary | ICD-10-CM | POA: Diagnosis not present

## 2023-02-08 DIAGNOSIS — N1831 Chronic kidney disease, stage 3a: Secondary | ICD-10-CM | POA: Diagnosis not present

## 2023-02-08 DIAGNOSIS — Z794 Long term (current) use of insulin: Secondary | ICD-10-CM | POA: Diagnosis not present

## 2023-02-09 ENCOUNTER — Other Ambulatory Visit: Payer: Self-pay

## 2023-02-12 ENCOUNTER — Ambulatory Visit (INDEPENDENT_AMBULATORY_CARE_PROVIDER_SITE_OTHER): Payer: Medicare HMO | Admitting: Gastroenterology

## 2023-02-12 ENCOUNTER — Encounter: Payer: Self-pay | Admitting: Gastroenterology

## 2023-02-12 VITALS — BP 110/60 | HR 64 | Ht 61.0 in | Wt 140.5 lb

## 2023-02-12 DIAGNOSIS — R141 Gas pain: Secondary | ICD-10-CM | POA: Diagnosis not present

## 2023-02-12 DIAGNOSIS — K5720 Diverticulitis of large intestine with perforation and abscess without bleeding: Secondary | ICD-10-CM

## 2023-02-12 MED ORDER — NA SULFATE-K SULFATE-MG SULF 17.5-3.13-1.6 GM/177ML PO SOLN: 1.0000 | Freq: Once | ORAL | 0 refills | Status: AC

## 2023-02-12 NOTE — Patient Instructions (Addendum)
  You have been scheduled for a colonoscopy. Please follow written instructions given to you at your visit today.   Please pick up your prep supplies at the pharmacy within the next 1-3 days.  If you use inhalers (even only as needed), please bring them with you on the day of your procedure.  DO NOT TAKE 7 DAYS PRIOR TO TEST- Trulicity (dulaglutide) Ozempic, Wegovy (semaglutide) Mounjaro (tirzepatide) Bydureon Bcise (exanatide extended release)  DO NOT TAKE 1 DAY PRIOR TO YOUR TEST Rybelsus (semaglutide) Adlyxin (lixisenatide) Victoza (liraglutide) Byetta (exanatide) ___________________________________________________________________________  _______________________________________________________  If your blood pressure at your visit was 140/90 or greater, please contact your primary care physician to follow up on this.  _______________________________________________________  If you are age 29 or older, your body mass index should be between 23-30. Your Body mass index is 26.55 kg/m. If this is out of the aforementioned range listed, please consider follow up with your Primary Care Provider.  If you are age 32 or younger, your body mass index should be between 19-25. Your Body mass index is 26.55 kg/m. If this is out of the aformentioned range listed, please consider follow up with your Primary Care Provider.   ________________________________________________________  The [facility] providers would like to encourage you to use [facility] to communicate with providers for non-urgent requests or questions.  Due to long hold times on the telephone, sending your provider a message by [facility] may be a faster and more efficient way to get a response.  Please allow 48 business hours for a response.  Please remember that this is for non-urgent requests.  _______________________________________________________ It was a pleasure to see you today!  Thank you for trusting me with your  gastrointestinal care!

## 2023-02-12 NOTE — Progress Notes (Signed)
Chief Complaint: Diverticulitis with abscess Primary GI MD: Gentry Fitz  HPI: 67 year old female history of diverticulitis with abscess, DM type II, CKD, presents for evaluation of complicated diverticulitis.  Recently admitted 12/26/2022 for diverticulitis with abscess and contained perforation.  CT showed acute descending and sigmoid colon diverticulitis with contained perforation.  General surgery was consulted and recommend IR guided drain placement which was performed 6/12.  Drain cultures showed E. coli and patient was placed on IV Zosyn which was transition to cefadroxil and Flagyl with symptom improvement.  Patient pulled drain out while repositioning herself and drain was replaced 6/19.  Patient recommended to follow-up with GI outpatient for colonoscopy. Scheduled for follow-up with Dr. Cliffton Asters 02/19/2023.  She states prior to her hospitalization she was having gas pains which then developed into LLQ pain and prompted her ED visit. Since admission her LLQ pain has significantly improved though she continues to have intermittent gas pains. Her diet is heavy on high FODMAPs such as broccoli.   Reports family history of colon cancer in paternal aunt. Denies melena/hematochezia.  Her drain currently puts out about 1-42mL per day. She was recently seen by IR and they informed her the surgeons will remove her drain.   PREVIOUS GI WORKUP   CT ab/pelvis with contrast 02/02/23: -Persistent colonic inflammatory changes at the junction of the sigmoid colon and descending colon -complex air-fluid collection associated with the colon at the sigmoid and descending colon junction. This collection measures 3.9 x 3.3 x 4.3 cm and previously measured 3.5 x 2.4 x 3.7 cm. Stable position of the drain within this collection. This collection could represent a large colonic diverticulum versus a chronic thick-walled abscess. Mild wall thickening in the colon just beyond the drain site. Normal  appendix.  Colonoscopy 10/31/1999 with Dr. Jarold Motto showed pancolonic diverticulosis, mild ileitis.  (Unknown biopsies)  EGD 10/31/1999 for epigastric pain, nausea/vomiting: "Mild antral gastritis probably secondary to high H. pylori infection.  Hiatal hernia and chronic gastroesophageal reflux.  Patient was put on Aciphex 20 Mg daily and consider to being treated for H. Pylori"  Past Medical History:  Diagnosis Date   Chronic kidney disease, stage III (moderate) (HCC)    Other and unspecified hyperlipidemia    Type II or unspecified type diabetes mellitus without mention of complication, not stated as uncontrolled    Unspecified essential hypertension     Past Surgical History:  Procedure Laterality Date   IR CATHETER TUBE CHANGE  01/03/2023   IR RADIOLOGIST EVAL & MGMT  01/19/2023    Current Outpatient Medications  Medication Sig Dispense Refill   atenolol-chlorthalidone (TENORETIC) 50-25 MG tablet Take 1 tablet by mouth daily.     atorvastatin (LIPITOR) 20 MG tablet Take 20 mg by mouth daily.  0   BD PEN NEEDLE NANO 2ND GEN 32G X 4 MM MISC AS DIRECTED INVITRO USE WITH LANTUS ONCE A DAY 90 DAYS     Continuous Glucose Receiver (DEXCOM G7 RECEIVER) DEVI check sugars at least 4-6 times a day for 30 days     insulin detemir (LEVEMIR) 100 UNIT/ML injection Inject 0.1 mLs (10 Units total) into the skin at bedtime. 10 mL 11   losartan (COZAAR) 100 MG tablet Take 100 mg by mouth daily.     ns flush (NS FLUSH) 0.9% SOLN Flush once daily with 5 ml. 200 mL 0   oxyCODONE-acetaminophen (PERCOCET/ROXICET) 5-325 MG tablet Take 1 tablet by mouth every 6 (six) hours as needed for severe pain or moderate pain. 20 tablet  0   polyethylene glycol powder (GLYCOLAX/MIRALAX) 17 GM/SCOOP powder Mix 1 capful (17 g total) in 8 oz clear liquid and drink by mouth once daily. 238 g 0   TRESIBA FLEXTOUCH 100 UNIT/ML FlexTouch Pen Inject into the skin.     No current facility-administered medications for this visit.     Allergies as of 02/12/2023 - Review Complete 12/27/2022  Allergen Reaction Noted   Lisinopril Swelling 11/13/2017   Metformin Diarrhea 12/26/2022    Family History  Problem Relation Age of Onset   Breast cancer Mother     Social History   Socioeconomic History   Marital status: Single    Spouse name: Not on file   Number of children: Not on file   Years of education: Not on file   Highest education level: Not on file  Occupational History   Not on file  Tobacco Use   Smoking status: Never   Smokeless tobacco: Never  Substance and Sexual Activity   Alcohol use: Not on file   Drug use: Not on file   Sexual activity: Not on file  Other Topics Concern   Not on file  Social History Narrative   Not on file   Social Determinants of Health   Financial Resource Strain: Not on file  Food Insecurity: No Food Insecurity (12/26/2022)   Hunger Vital Sign    Worried About Running Out of Food in the Last Year: Never true    Ran Out of Food in the Last Year: Never true  Transportation Needs: No Transportation Needs (12/26/2022)   PRAPARE - Administrator, Civil Service (Medical): No    Lack of Transportation (Non-Medical): No  Physical Activity: Not on file  Stress: Not on file  Social Connections: Not on file  Intimate Partner Violence: Not At Risk (12/26/2022)   Humiliation, Afraid, Rape, and Kick questionnaire    Fear of Current or Ex-Partner: No    Emotionally Abused: No    Physically Abused: No    Sexually Abused: No    Review of Systems:    Constitutional: No weight loss, fever, chills, weakness or fatigue HEENT: Eyes: No change in vision               Ears, Nose, Throat:  No change in hearing or congestion Skin: No rash or itching Cardiovascular: No chest pain, chest pressure or palpitations   Respiratory: No SOB or cough Gastrointestinal: See HPI and otherwise negative Genitourinary: No dysuria or change in urinary frequency Neurological: No  headache, dizziness or syncope Musculoskeletal: No new muscle or joint pain Hematologic: No bleeding or bruising Psychiatric: No history of depression or anxiety    Physical Exam:  Vital signs: There were no vitals taken for this visit.  Constitutional: NAD, Well developed, Well nourished, alert and cooperative Head:  Normocephalic and atraumatic. Eyes:   PEERL, EOMI. No icterus. Conjunctiva pink. Respiratory: Respirations even and unlabored. Lungs clear to auscultation bilaterally.   No wheezes, crackles, or rhonchi.  Cardiovascular:  Regular rate and rhythm. No peripheral edema, cyanosis or pallor.  Gastrointestinal:  Soft, nondistended, nontender. No rebound or guarding. Normal bowel sounds. No appreciable masses or hepatomegaly. LLQ drain noted with no output. Rectal:  Not performed.  Msk:  Symmetrical without gross deformities. Without edema, no deformity or joint abnormality.  Neurologic:  Alert and  oriented x4;  grossly normal neurologically.  Skin:   Dry and intact without significant lesions or rashes. Psychiatric: Oriented to person, place and time.  Demonstrates good judgement and reason without abnormal affect or behaviors.   RELEVANT LABS AND IMAGING: CBC    Component Value Date/Time   WBC 7.9 01/04/2023 0901   RBC 3.84 (L) 01/04/2023 0901   HGB 10.1 (L) 01/04/2023 0901   HCT 32.4 (L) 01/04/2023 0901   PLT 308 01/04/2023 0901   MCV 84.4 01/04/2023 0901   MCH 26.3 01/04/2023 0901   MCHC 31.2 01/04/2023 0901   RDW 14.4 01/04/2023 0901   LYMPHSABS 1.7 01/04/2023 0901   MONOABS 0.8 01/04/2023 0901   EOSABS 0.1 01/04/2023 0901   BASOSABS 0.0 01/04/2023 0901    CMP     Component Value Date/Time   NA 135 01/04/2023 0901   K 3.5 01/04/2023 0901   CL 103 01/04/2023 0901   CO2 23 01/04/2023 0901   GLUCOSE 155 (H) 01/04/2023 0901   BUN 5 (L) 01/04/2023 0901   CREATININE 0.99 01/04/2023 0901   CALCIUM 8.5 (L) 01/04/2023 0901   PROT 7.6 12/26/2022 1341   ALBUMIN  3.2 (L) 12/26/2022 1341   AST 14 (L) 12/26/2022 1341   ALT 15 12/26/2022 1341   ALKPHOS 79 12/26/2022 1341   BILITOT 0.4 12/26/2022 1341   GFRNONAA >60 01/04/2023 0901   GFRAA >60 04/03/2011 1745     Assessment/Plan:   Diverticulitis of large intestine with abscess without bleeding S/p drain placement 6/19 with minimal output. Scheduled to see surgeons in 7 days. Last colonoscopy in 2001. - schedule colonoscopy greater than 8 weeks after diverticulitis diagnosis (no sooner than 02/25/23). - I thoroughly discussed the procedure with the patient (at bedside) to include nature of the procedure, alternatives, benefits, and risks (including but not limited to bleeding, infection, perforation, anesthesia/cardiac pulmonary complications).  Patient verbalized understanding and gave verbal consent to proceed with procedure.    Gas pain - discussed low FODMAP diet and provided handout - use simethicone prn  [facility] 02/12/2023, 10:41 AM  Cc: Merri Brunette, MD

## 2023-02-14 DIAGNOSIS — K5720 Diverticulitis of large intestine with perforation and abscess without bleeding: Secondary | ICD-10-CM | POA: Diagnosis not present

## 2023-02-14 DIAGNOSIS — Z4803 Encounter for change or removal of drains: Secondary | ICD-10-CM | POA: Diagnosis not present

## 2023-02-14 DIAGNOSIS — N1831 Chronic kidney disease, stage 3a: Secondary | ICD-10-CM | POA: Diagnosis not present

## 2023-02-14 DIAGNOSIS — E1165 Type 2 diabetes mellitus with hyperglycemia: Secondary | ICD-10-CM | POA: Diagnosis not present

## 2023-02-14 DIAGNOSIS — Z794 Long term (current) use of insulin: Secondary | ICD-10-CM | POA: Diagnosis not present

## 2023-02-14 DIAGNOSIS — E7849 Other hyperlipidemia: Secondary | ICD-10-CM | POA: Diagnosis not present

## 2023-02-14 DIAGNOSIS — E1122 Type 2 diabetes mellitus with diabetic chronic kidney disease: Secondary | ICD-10-CM | POA: Diagnosis not present

## 2023-02-14 DIAGNOSIS — Z79891 Long term (current) use of opiate analgesic: Secondary | ICD-10-CM | POA: Diagnosis not present

## 2023-02-14 DIAGNOSIS — I129 Hypertensive chronic kidney disease with stage 1 through stage 4 chronic kidney disease, or unspecified chronic kidney disease: Secondary | ICD-10-CM | POA: Diagnosis not present

## 2023-02-16 DIAGNOSIS — D473 Essential (hemorrhagic) thrombocythemia: Secondary | ICD-10-CM | POA: Diagnosis not present

## 2023-02-19 DIAGNOSIS — K5720 Diverticulitis of large intestine with perforation and abscess without bleeding: Secondary | ICD-10-CM | POA: Diagnosis not present

## 2023-02-21 DIAGNOSIS — Z79891 Long term (current) use of opiate analgesic: Secondary | ICD-10-CM | POA: Diagnosis not present

## 2023-02-21 DIAGNOSIS — E1122 Type 2 diabetes mellitus with diabetic chronic kidney disease: Secondary | ICD-10-CM | POA: Diagnosis not present

## 2023-02-21 DIAGNOSIS — I129 Hypertensive chronic kidney disease with stage 1 through stage 4 chronic kidney disease, or unspecified chronic kidney disease: Secondary | ICD-10-CM | POA: Diagnosis not present

## 2023-02-21 DIAGNOSIS — E1165 Type 2 diabetes mellitus with hyperglycemia: Secondary | ICD-10-CM | POA: Diagnosis not present

## 2023-02-21 DIAGNOSIS — Z4803 Encounter for change or removal of drains: Secondary | ICD-10-CM | POA: Diagnosis not present

## 2023-02-21 DIAGNOSIS — N1831 Chronic kidney disease, stage 3a: Secondary | ICD-10-CM | POA: Diagnosis not present

## 2023-02-21 DIAGNOSIS — Z794 Long term (current) use of insulin: Secondary | ICD-10-CM | POA: Diagnosis not present

## 2023-02-21 DIAGNOSIS — K5720 Diverticulitis of large intestine with perforation and abscess without bleeding: Secondary | ICD-10-CM | POA: Diagnosis not present

## 2023-02-21 DIAGNOSIS — E7849 Other hyperlipidemia: Secondary | ICD-10-CM | POA: Diagnosis not present

## 2023-02-22 NOTE — Progress Notes (Signed)
Agree with assessment/plan.  Raj , MD [facility] [phone]  

## 2023-02-26 ENCOUNTER — Encounter: Payer: Self-pay | Admitting: Gastroenterology

## 2023-03-05 DIAGNOSIS — I129 Hypertensive chronic kidney disease with stage 1 through stage 4 chronic kidney disease, or unspecified chronic kidney disease: Secondary | ICD-10-CM | POA: Diagnosis not present

## 2023-03-05 DIAGNOSIS — E1122 Type 2 diabetes mellitus with diabetic chronic kidney disease: Secondary | ICD-10-CM | POA: Diagnosis not present

## 2023-03-05 DIAGNOSIS — K5720 Diverticulitis of large intestine with perforation and abscess without bleeding: Secondary | ICD-10-CM | POA: Diagnosis not present

## 2023-03-05 DIAGNOSIS — N1831 Chronic kidney disease, stage 3a: Secondary | ICD-10-CM | POA: Diagnosis not present

## 2023-03-05 DIAGNOSIS — Z79891 Long term (current) use of opiate analgesic: Secondary | ICD-10-CM | POA: Diagnosis not present

## 2023-03-05 DIAGNOSIS — Z794 Long term (current) use of insulin: Secondary | ICD-10-CM | POA: Diagnosis not present

## 2023-03-05 DIAGNOSIS — E1165 Type 2 diabetes mellitus with hyperglycemia: Secondary | ICD-10-CM | POA: Diagnosis not present

## 2023-03-05 DIAGNOSIS — Z4803 Encounter for change or removal of drains: Secondary | ICD-10-CM | POA: Diagnosis not present

## 2023-03-05 DIAGNOSIS — E7849 Other hyperlipidemia: Secondary | ICD-10-CM | POA: Diagnosis not present

## 2023-03-07 ENCOUNTER — Ambulatory Visit: Payer: Medicare HMO | Admitting: Gastroenterology

## 2023-03-07 ENCOUNTER — Encounter: Payer: Self-pay | Admitting: Gastroenterology

## 2023-03-07 VITALS — BP 141/60 | HR 84 | Temp 98.7°F | Resp 22 | Ht 61.0 in | Wt 140.0 lb

## 2023-03-07 DIAGNOSIS — N183 Chronic kidney disease, stage 3 unspecified: Secondary | ICD-10-CM | POA: Diagnosis not present

## 2023-03-07 DIAGNOSIS — Z01818 Encounter for other preprocedural examination: Secondary | ICD-10-CM

## 2023-03-07 DIAGNOSIS — K5720 Diverticulitis of large intestine with perforation and abscess without bleeding: Secondary | ICD-10-CM

## 2023-03-07 DIAGNOSIS — I10 Essential (primary) hypertension: Secondary | ICD-10-CM | POA: Diagnosis not present

## 2023-03-07 DIAGNOSIS — K529 Noninfective gastroenteritis and colitis, unspecified: Secondary | ICD-10-CM | POA: Diagnosis not present

## 2023-03-07 DIAGNOSIS — K5000 Crohn's disease of small intestine without complications: Secondary | ICD-10-CM | POA: Diagnosis not present

## 2023-03-07 DIAGNOSIS — E119 Type 2 diabetes mellitus without complications: Secondary | ICD-10-CM | POA: Diagnosis not present

## 2023-03-07 MED ORDER — SODIUM CHLORIDE 0.9 % IV SOLN
500.0000 mL | Freq: Once | INTRAVENOUS | Status: DC
Start: 2023-03-07 — End: 2023-03-07

## 2023-03-07 NOTE — Progress Notes (Unsigned)
Called to room to assist during endoscopic procedure.  Patient ID and intended procedure confirmed with present staff. Received instructions for my participation in the procedure from the performing physician.  

## 2023-03-07 NOTE — Patient Instructions (Signed)
Please read handouts provided. Continue present medications. Await pathology results. Recommend follow-up with Dr. Angelena Form.   YOU HAD AN ENDOSCOPIC PROCEDURE TODAY AT THE [facility]:   Refer to the procedure report that was given to you for any specific questions about what was found during the examination.  If the procedure report does not answer your questions, please call your gastroenterologist to clarify.  If you requested that your care partner not be given the details of your procedure findings, then the procedure report has been included in a sealed envelope for you to review at your convenience later.  YOU SHOULD EXPECT: Some feelings of bloating in the abdomen. Passage of more gas than usual.  Walking can help get rid of the air that was put into your GI tract during the procedure and reduce the bloating. If you had a lower endoscopy (such as a colonoscopy or flexible sigmoidoscopy) you may notice spotting of blood in your stool or on the toilet paper. If you underwent a bowel prep for your procedure, you may not have a normal bowel movement for a few days.  Please Note:  You might notice some irritation and congestion in your nose or some drainage.  This is from the oxygen used during your procedure.  There is no need for concern and it should clear up in a day or so.  SYMPTOMS TO REPORT IMMEDIATELY:  Following lower endoscopy (colonoscopy or flexible sigmoidoscopy):  Excessive amounts of blood in the stool  Significant tenderness or worsening of abdominal pains  Swelling of the abdomen that is new, acute  Fever of 100F or higher   For urgent or emergent issues, a gastroenterologist can be reached at any hour by calling [phone]. Do not use MyChart messaging for urgent concerns.    DIET:  We do recommend a small meal at first, but then you may proceed to your regular diet.  Drink plenty of fluids but you should avoid alcoholic beverages for 24  hours.  ACTIVITY:  You should plan to take it easy for the rest of today and you should NOT DRIVE or use heavy machinery until tomorrow (because of the sedation medicines used during the test).    FOLLOW UP: Our staff will call the number listed on your records the next business day following your procedure.  We will call around 7:15- 8:00 am to check on you and address any questions or concerns that you may have regarding the information given to you following your procedure. If we do not reach you, we will leave a message.     If any biopsies were taken you will be contacted by phone or by letter within the next 1-3 weeks.  Please call us at [phone] if you have not heard about the biopsies in 3 weeks.    SIGNATURES/CONFIDENTIALITY: You and/or your care partner have signed paperwork which will be entered into your electronic medical record.  These signatures attest to the fact that that the information above on your After Visit Summary has been reviewed and is understood.  Full responsibility of the confidentiality of this discharge information lies with you and/or your care-partner.

## 2023-03-07 NOTE — Progress Notes (Unsigned)
Chief Complaint: Diverticulitis with abscess Primary GI MD: Gentry Fitz   HPI: 67 year old female history of diverticulitis with abscess, DM type II, CKD, presents for evaluation of complicated diverticulitis.   Recently admitted 12/26/2022 for diverticulitis with abscess and contained perforation.  CT showed acute descending and sigmoid colon diverticulitis with contained perforation.  General surgery was consulted and recommend IR guided drain placement which was performed 6/12.  Drain cultures showed E. coli and patient was placed on IV Zosyn which was transition to cefadroxil and Flagyl with symptom improvement.  Patient pulled drain out while repositioning herself and drain was replaced 6/19.   Patient recommended to follow-up with GI outpatient for colonoscopy. Scheduled for follow-up with Dr. Cliffton Asters 02/19/2023.   She states prior to her hospitalization she was having gas pains which then developed into LLQ pain and prompted her ED visit. Since admission her LLQ pain has significantly improved though she continues to have intermittent gas pains. Her diet is heavy on high FODMAPs such as broccoli.    Reports family history of colon cancer in paternal aunt. Denies melena/hematochezia.   Her drain currently puts out about 1-60mL per day. She was recently seen by IR and they informed her the surgeons will remove her drain.     PREVIOUS GI WORKUP    CT ab/pelvis with contrast 02/02/23: -Persistent colonic inflammatory changes at the junction of the sigmoid colon and descending colon -complex air-fluid collection associated with the colon at the sigmoid and descending colon junction. This collection measures 3.9 x 3.3 x 4.3 cm and previously measured 3.5 x 2.4 x 3.7 cm. Stable position of the drain within this collection. This collection could represent a large colonic diverticulum versus a chronic thick-walled abscess. Mild wall thickening in the colon just beyond the drain site. Normal  appendix.   Colonoscopy 10/31/1999 with Dr. Jarold Motto showed pancolonic diverticulosis, mild ileitis.  (Unknown biopsies)   EGD 10/31/1999 for epigastric pain, nausea/vomiting: "Mild antral gastritis probably secondary to high H. pylori infection.  Hiatal hernia and chronic gastroesophageal reflux.  Patient was put on Aciphex 20 Mg daily and consider to being treated for H. Pylori"       Past Medical History:  Diagnosis Date   Chronic kidney disease, stage III (moderate) (HCC)     Other and unspecified hyperlipidemia     Type II or unspecified type diabetes mellitus without mention of complication, not stated as uncontrolled     Unspecified essential hypertension                 Past Surgical History:  Procedure Laterality Date   IR CATHETER TUBE CHANGE   01/03/2023   IR RADIOLOGIST EVAL & MGMT   01/19/2023                Current Outpatient Medications  Medication Sig Dispense Refill   atenolol-chlorthalidone (TENORETIC) 50-25 MG tablet Take 1 tablet by mouth daily.       atorvastatin (LIPITOR) 20 MG tablet Take 20 mg by mouth daily.   0   BD PEN NEEDLE NANO 2ND GEN 32G X 4 MM MISC AS DIRECTED INVITRO USE WITH LANTUS ONCE A DAY 90 DAYS       Continuous Glucose Receiver (DEXCOM G7 RECEIVER) DEVI check sugars at least 4-6 times a day for 30 days       insulin detemir (LEVEMIR) 100 UNIT/ML injection Inject 0.1 mLs (10 Units total) into the skin at bedtime. 10 mL 11   losartan (COZAAR) 100  MG tablet Take 100 mg by mouth daily.       ns flush (NS FLUSH) 0.9% SOLN Flush once daily with 5 ml. 200 mL 0   oxyCODONE-acetaminophen (PERCOCET/ROXICET) 5-325 MG tablet Take 1 tablet by mouth every 6 (six) hours as needed for severe pain or moderate pain. 20 tablet 0   polyethylene glycol powder (GLYCOLAX/MIRALAX) 17 GM/SCOOP powder Mix 1 capful (17 g total) in 8 oz clear liquid and drink by mouth once daily. 238 g 0   TRESIBA FLEXTOUCH 100 UNIT/ML FlexTouch Pen Inject into the skin.          No  current facility-administered medications for this visit.             Allergies as of 02/12/2023 - Review Complete 12/27/2022  Allergen Reaction Noted   Lisinopril Swelling 11/13/2017   Metformin Diarrhea 12/26/2022           Family History  Problem Relation Age of Onset   Breast cancer Mother            Social History         Socioeconomic History   Marital status: Single      Spouse name: Not on file   Number of children: Not on file   Years of education: Not on file   Highest education level: Not on file  Occupational History   Not on file  Tobacco Use   Smoking status: Never   Smokeless tobacco: Never  Substance and Sexual Activity   Alcohol use: Not on file   Drug use: Not on file   Sexual activity: Not on file  Other Topics Concern   Not on file  Social History Narrative   Not on file    Social Determinants of Health        Financial Resource Strain: Not on file  Food Insecurity: No Food Insecurity (12/26/2022)    Hunger Vital Sign     Worried About Running Out of Food in the Last Year: Never true     Ran Out of Food in the Last Year: Never true  Transportation Needs: No Transportation Needs (12/26/2022)    PRAPARE - Therapist, art (Medical): No     Lack of Transportation (Non-Medical): No  Physical Activity: Not on file  Stress: Not on file  Social Connections: Not on file  Intimate Partner Violence: Not At Risk (12/26/2022)    Humiliation, Afraid, Rape, and Kick questionnaire     Fear of Current or Ex-Partner: No     Emotionally Abused: No     Physically Abused: No     Sexually Abused: No      Review of Systems:    Constitutional: No weight loss, fever, chills, weakness or fatigue HEENT: Eyes: No change in vision               Ears, Nose, Throat:  No change in hearing or congestion Skin: No rash or itching Cardiovascular: No chest pain, chest pressure or palpitations   Respiratory: No SOB or  cough Gastrointestinal: See HPI and otherwise negative Genitourinary: No dysuria or change in urinary frequency Neurological: No headache, dizziness or syncope Musculoskeletal: No new muscle or joint pain Hematologic: No bleeding or bruising Psychiatric: No history of depression or anxiety      Physical Exam:  Vital signs: There were no vitals taken for this visit.   Constitutional: NAD, Well developed, Well nourished, alert and cooperative Head:  Normocephalic and  atraumatic. Eyes:   PEERL, EOMI. No icterus. Conjunctiva pink. Respiratory: Respirations even and unlabored. Lungs clear to auscultation bilaterally.   No wheezes, crackles, or rhonchi.  Cardiovascular:  Regular rate and rhythm. No peripheral edema, cyanosis or pallor.  Gastrointestinal:  Soft, nondistended, nontender. No rebound or guarding. Normal bowel sounds. No appreciable masses or hepatomegaly. LLQ drain noted with no output. Rectal:  Not performed.  Msk:  Symmetrical without gross deformities. Without edema, no deformity or joint abnormality.  Neurologic:  Alert and  oriented x4;  grossly normal neurologically.  Skin:   Dry and intact without significant lesions or rashes. Psychiatric: Oriented to person, place and time. Demonstrates good judgement and reason without abnormal affect or behaviors.     RELEVANT LABS AND IMAGING: CBC Labs (Brief)          Component Value Date/Time    WBC 7.9 01/04/2023 0901    RBC 3.84 (L) 01/04/2023 0901    HGB 10.1 (L) 01/04/2023 0901    HCT 32.4 (L) 01/04/2023 0901    PLT 308 01/04/2023 0901    MCV 84.4 01/04/2023 0901    MCH 26.3 01/04/2023 0901    MCHC 31.2 01/04/2023 0901    RDW 14.4 01/04/2023 0901    LYMPHSABS 1.7 01/04/2023 0901    MONOABS 0.8 01/04/2023 0901    EOSABS 0.1 01/04/2023 0901    BASOSABS 0.0 01/04/2023 0901        CMP     Labs (Brief)          Component Value Date/Time    NA 135 01/04/2023 0901    K 3.5 01/04/2023 0901    CL 103 01/04/2023  0901    CO2 23 01/04/2023 0901    GLUCOSE 155 (H) 01/04/2023 0901    BUN 5 (L) 01/04/2023 0901    CREATININE 0.99 01/04/2023 0901    CALCIUM 8.5 (L) 01/04/2023 0901    PROT 7.6 12/26/2022 1341    ALBUMIN 3.2 (L) 12/26/2022 1341    AST 14 (L) 12/26/2022 1341    ALT 15 12/26/2022 1341    ALKPHOS 79 12/26/2022 1341    BILITOT 0.4 12/26/2022 1341    GFRNONAA >60 01/04/2023 0901    GFRAA >60 04/03/2011 1745          Assessment/Plan:    Diverticulitis of large intestine with abscess without bleeding S/p drain placement 6/19 with minimal output. Scheduled to see surgeons in 7 days. Last colonoscopy in 2001. - schedule colonoscopy greater than 8 weeks after diverticulitis diagnosis (no sooner than 02/25/23). - I thoroughly discussed the procedure with the patient (at bedside) to include nature of the procedure, alternatives, benefits, and risks (including but not limited to bleeding, infection, perforation, anesthesia/cardiac pulmonary complications).  Patient verbalized understanding and gave verbal consent to proceed with procedure.     Gas pain - discussed low FODMAP diet and provided handout - use simethicone prn   [facility]   Attending physician's note   I have taken history, reviewed the chart and examined the patient. I performed a substantive portion of this encounter, including complete performance of at least one of the key components, in conjunction with the APP. I agree with the Advanced Practitioner's note, impression and recommendations.   Colon today   Edman Circle, MD [facility] [phone]

## 2023-03-07 NOTE — Progress Notes (Unsigned)
Sedate, gd SR, tolerated procedure well, VSS, report to RN 

## 2023-03-07 NOTE — Progress Notes (Signed)
Pt's states no medical or surgical changes since previsit or office visit. 

## 2023-03-07 NOTE — Op Note (Signed)
[facility] Patient Name: Jillian Montgomery Procedure Date: 03/07/2023 1:25 PM MRN: [id number] Endoscopist: Lynann Bologna , MD, [id number] Age: 67 Referring MD:  Date of Birth: November 07, 1955 Gender: Female Account #: [id number] Procedure:                Colonoscopy Indications:              1. Screening for colorectal malignant neoplasm. 2.                            Diverticulitis of large intestine with abscess S/p                            drain placement 6/19 with minimal drainage.                            Colonoscopy is being performed prior to surgery to                            rule out any masses. Medicines:                Monitored Anesthesia Care Procedure:                Pre-Anesthesia Assessment:                           - Prior to the procedure, a History and Physical                            was performed, and patient medications and                            allergies were reviewed. The patient's tolerance of                            previous anesthesia was also reviewed. The risks                            and benefits of the procedure and the sedation                            options and risks were discussed with the patient.                            All questions were answered, and informed consent                            was obtained. Prior Anticoagulants: The patient has                            taken no anticoagulant or antiplatelet agents. ASA                            Grade Assessment: III - A patient with severe  systemic disease. After reviewing the risks and                            benefits, the patient was deemed in satisfactory                            condition to undergo the procedure.                           After obtaining informed consent, the colonoscope                            was passed under direct vision. Throughout the                            procedure, the patient's blood pressure,  pulse, and                            oxygen saturations were monitored continuously. The                            [id number]-HQ190L Colonoscope [id number] was introduced                            through the anus and advanced to the cecum. The                            scope was wedged at ileocecal valve and TI                            evaluated. TI was never deeply intubated due to                            stricture. The colonoscopy was performed without                            difficulty. The patient tolerated the procedure                            well. The quality of the bowel preparation was                            good. The ileocecal valve, appendiceal orifice, and                            rectum were photographed. Minimal air insufflation                            was used. Scope In: 1:46:17 PM Scope Out: 2:05:25 PM Scope Withdrawal Time: 0 hours 15 minutes 59 seconds  Total Procedure Duration: 0 hours 19 minutes 8 seconds  Findings:                 Multiple medium-mouthed diverticula were found in  the sigmoid colon (mainly up to 40 cm from the anal                            verge on withdrawal of the scope) with evidence of                            recent diverticulitis in form of erythema. There                            was luminal narrowing consistent with muscular                            hypertrophy. Few biopsies were obtained and sent                            for histology. Few diverticula were also visualized                            in descending colon, transverse colon and ascending                            colon.                           The terminal ileum contained a benign-appearing,                            intrinsic moderate stenosis measuring less than one                            cm (in length) x 1 cm (inner diameter) that was                            non-traversed with pediatric scope. Few erosions                             were noted in the terminal ileum.Marland Kitchen Biopsies were                            taken with a cold forceps for histology (obtained                            by wedging the scope at IC valve).                           Non-bleeding internal hemorrhoids were found during                            retroflexion. The hemorrhoids were moderate.                           The exam was otherwise without abnormality on  direct and retroflexion views. Complications:            No immediate complications. Estimated Blood Loss:     Estimated blood loss: none. Impression:               - Moderate to severe sigmoid diverticulosis with                            evidence of recent diverticulitis (biopsied). No                            masses.                           - Mild pancolonic diverticulosis.                           - Benign-appearing stricture of terminal ileum with                            few erosions (Biopsied). Could represent NSAID                            stricture vs scarring from undiagnosed but mild                            quiescent Crohn's disease. No evidence of Crohn's                            disease colon or perirectal area.                           - Non-bleeding internal hemorrhoids.                           - The examination was otherwise normal on direct                            and retroflexion views. Recommendation:           - Patient has a contact number available for                            emergencies. The signs and symptoms of potential                            delayed complications were discussed with the                            patient. Return to normal activities tomorrow.                            Written discharge instructions were provided to the                            patient.                           -  Resume previous diet.                           - Continue present medications.                            - Await pathology results.                           - Recommend FU with Dr Angelena Form for possible                            sigmoid resection and eval if ileocecal area                            laparoscopically (at the time of surgery)                           - The findings and recommendations were discussed                            with the patient's family. Lynann Bologna, MD 03/07/2023 2:20:42 PM This report has been signed electronically.

## 2023-03-08 ENCOUNTER — Telehealth: Payer: Self-pay

## 2023-03-08 DIAGNOSIS — K5720 Diverticulitis of large intestine with perforation and abscess without bleeding: Secondary | ICD-10-CM | POA: Diagnosis not present

## 2023-03-08 DIAGNOSIS — E7849 Other hyperlipidemia: Secondary | ICD-10-CM | POA: Diagnosis not present

## 2023-03-08 DIAGNOSIS — Z4803 Encounter for change or removal of drains: Secondary | ICD-10-CM | POA: Diagnosis not present

## 2023-03-08 DIAGNOSIS — Z79891 Long term (current) use of opiate analgesic: Secondary | ICD-10-CM | POA: Diagnosis not present

## 2023-03-08 DIAGNOSIS — Z794 Long term (current) use of insulin: Secondary | ICD-10-CM | POA: Diagnosis not present

## 2023-03-08 DIAGNOSIS — E1122 Type 2 diabetes mellitus with diabetic chronic kidney disease: Secondary | ICD-10-CM | POA: Diagnosis not present

## 2023-03-08 DIAGNOSIS — N1831 Chronic kidney disease, stage 3a: Secondary | ICD-10-CM | POA: Diagnosis not present

## 2023-03-08 DIAGNOSIS — I129 Hypertensive chronic kidney disease with stage 1 through stage 4 chronic kidney disease, or unspecified chronic kidney disease: Secondary | ICD-10-CM | POA: Diagnosis not present

## 2023-03-08 DIAGNOSIS — E1165 Type 2 diabetes mellitus with hyperglycemia: Secondary | ICD-10-CM | POA: Diagnosis not present

## 2023-03-08 NOTE — Telephone Encounter (Signed)
  Follow up Call-     03/07/2023    1:10 PM  Call back number  Post procedure Call Back phone  # [phone]  Permission to leave phone message Yes     Patient questions:  Do you have a fever, pain , or abdominal swelling? No. Pain Score  7 *  Have you tolerated food without any problems? Yes.    Have you been able to return to your normal activities? Yes.    Do you have any questions about your discharge instructions: Diet   No. Medications  Yes.   Follow up visit  No.  Do you have questions or concerns about your Care? Yes.    Actions: * If pain score is 4 or above: Physician/ provider Notified : Lynann Bologna, MD  Pt states she called Dr Cliffton Asters about the pain because she took the last pain pill yesterday..Nurse told her to call radiologist, Dr Hinn(pt said was radiologist) told her to call Dr Cliffton Asters as her next steps are to have the surgery.  Pt states Dr Chales Abrahams told her the next step is to have surgery.  Since pt pain is "7" now and Dr Cliffton Asters had prescribed pain medication, instruct pt to call Dr Cliffton Asters and ask to speak with him and discuss her next steps.  Let her know if she does not get an answer and pain is too severe, she is to go to the ED at [facility] today.  Pt verb understanding.

## 2023-03-08 NOTE — Telephone Encounter (Signed)
Have discussed in detail with patient.  [birth year] Pain at the site of IR catheter. Tolerating p.o. well.  No abdominal pain I have also sent message to Dr. Cliffton Asters.  Her diabetes has to be under better control, before surgery. She will let us know if there is any problems until then

## 2023-03-13 ENCOUNTER — Encounter: Payer: Self-pay | Admitting: Gastroenterology

## 2023-03-13 DIAGNOSIS — E7849 Other hyperlipidemia: Secondary | ICD-10-CM | POA: Diagnosis not present

## 2023-03-13 DIAGNOSIS — N1831 Chronic kidney disease, stage 3a: Secondary | ICD-10-CM | POA: Diagnosis not present

## 2023-03-13 DIAGNOSIS — Z79891 Long term (current) use of opiate analgesic: Secondary | ICD-10-CM | POA: Diagnosis not present

## 2023-03-13 DIAGNOSIS — E1122 Type 2 diabetes mellitus with diabetic chronic kidney disease: Secondary | ICD-10-CM | POA: Diagnosis not present

## 2023-03-13 DIAGNOSIS — I129 Hypertensive chronic kidney disease with stage 1 through stage 4 chronic kidney disease, or unspecified chronic kidney disease: Secondary | ICD-10-CM | POA: Diagnosis not present

## 2023-03-13 DIAGNOSIS — Z4803 Encounter for change or removal of drains: Secondary | ICD-10-CM | POA: Diagnosis not present

## 2023-03-13 DIAGNOSIS — K5720 Diverticulitis of large intestine with perforation and abscess without bleeding: Secondary | ICD-10-CM | POA: Diagnosis not present

## 2023-03-13 DIAGNOSIS — E1165 Type 2 diabetes mellitus with hyperglycemia: Secondary | ICD-10-CM | POA: Diagnosis not present

## 2023-03-13 DIAGNOSIS — Z794 Long term (current) use of insulin: Secondary | ICD-10-CM | POA: Diagnosis not present

## 2023-03-21 DIAGNOSIS — N1831 Chronic kidney disease, stage 3a: Secondary | ICD-10-CM | POA: Diagnosis not present

## 2023-03-21 DIAGNOSIS — Z4803 Encounter for change or removal of drains: Secondary | ICD-10-CM | POA: Diagnosis not present

## 2023-03-21 DIAGNOSIS — K5720 Diverticulitis of large intestine with perforation and abscess without bleeding: Secondary | ICD-10-CM | POA: Diagnosis not present

## 2023-03-21 DIAGNOSIS — I129 Hypertensive chronic kidney disease with stage 1 through stage 4 chronic kidney disease, or unspecified chronic kidney disease: Secondary | ICD-10-CM | POA: Diagnosis not present

## 2023-03-21 DIAGNOSIS — Z794 Long term (current) use of insulin: Secondary | ICD-10-CM | POA: Diagnosis not present

## 2023-03-21 DIAGNOSIS — E1122 Type 2 diabetes mellitus with diabetic chronic kidney disease: Secondary | ICD-10-CM | POA: Diagnosis not present

## 2023-03-21 DIAGNOSIS — E7849 Other hyperlipidemia: Secondary | ICD-10-CM | POA: Diagnosis not present

## 2023-03-21 DIAGNOSIS — Z79891 Long term (current) use of opiate analgesic: Secondary | ICD-10-CM | POA: Diagnosis not present

## 2023-03-21 DIAGNOSIS — E1165 Type 2 diabetes mellitus with hyperglycemia: Secondary | ICD-10-CM | POA: Diagnosis not present

## 2023-03-27 DIAGNOSIS — I10 Essential (primary) hypertension: Secondary | ICD-10-CM | POA: Diagnosis not present

## 2023-03-27 DIAGNOSIS — Z01818 Encounter for other preprocedural examination: Secondary | ICD-10-CM | POA: Diagnosis not present

## 2023-03-27 DIAGNOSIS — R9431 Abnormal electrocardiogram [ECG] [EKG]: Secondary | ICD-10-CM | POA: Diagnosis not present

## 2023-03-27 DIAGNOSIS — E1165 Type 2 diabetes mellitus with hyperglycemia: Secondary | ICD-10-CM | POA: Diagnosis not present

## 2023-03-29 ENCOUNTER — Ambulatory Visit: Payer: Medicare HMO | Admitting: Physician Assistant

## 2023-03-30 DIAGNOSIS — Z4803 Encounter for change or removal of drains: Secondary | ICD-10-CM | POA: Diagnosis not present

## 2023-03-30 DIAGNOSIS — E1122 Type 2 diabetes mellitus with diabetic chronic kidney disease: Secondary | ICD-10-CM | POA: Diagnosis not present

## 2023-03-30 DIAGNOSIS — E1165 Type 2 diabetes mellitus with hyperglycemia: Secondary | ICD-10-CM | POA: Diagnosis not present

## 2023-03-30 DIAGNOSIS — E7849 Other hyperlipidemia: Secondary | ICD-10-CM | POA: Diagnosis not present

## 2023-03-30 DIAGNOSIS — K5720 Diverticulitis of large intestine with perforation and abscess without bleeding: Secondary | ICD-10-CM | POA: Diagnosis not present

## 2023-03-30 DIAGNOSIS — Z79891 Long term (current) use of opiate analgesic: Secondary | ICD-10-CM | POA: Diagnosis not present

## 2023-03-30 DIAGNOSIS — I129 Hypertensive chronic kidney disease with stage 1 through stage 4 chronic kidney disease, or unspecified chronic kidney disease: Secondary | ICD-10-CM | POA: Diagnosis not present

## 2023-03-30 DIAGNOSIS — N1831 Chronic kidney disease, stage 3a: Secondary | ICD-10-CM | POA: Diagnosis not present

## 2023-03-30 DIAGNOSIS — Z794 Long term (current) use of insulin: Secondary | ICD-10-CM | POA: Diagnosis not present

## 2023-04-03 ENCOUNTER — Other Ambulatory Visit (HOSPITAL_COMMUNITY): Payer: Self-pay | Admitting: Surgery

## 2023-04-03 DIAGNOSIS — Z4803 Encounter for change or removal of drains: Secondary | ICD-10-CM | POA: Diagnosis not present

## 2023-04-03 DIAGNOSIS — E7849 Other hyperlipidemia: Secondary | ICD-10-CM | POA: Diagnosis not present

## 2023-04-03 DIAGNOSIS — Z79891 Long term (current) use of opiate analgesic: Secondary | ICD-10-CM | POA: Diagnosis not present

## 2023-04-03 DIAGNOSIS — E1122 Type 2 diabetes mellitus with diabetic chronic kidney disease: Secondary | ICD-10-CM | POA: Diagnosis not present

## 2023-04-03 DIAGNOSIS — Z794 Long term (current) use of insulin: Secondary | ICD-10-CM | POA: Diagnosis not present

## 2023-04-03 DIAGNOSIS — K5720 Diverticulitis of large intestine with perforation and abscess without bleeding: Secondary | ICD-10-CM | POA: Diagnosis not present

## 2023-04-03 DIAGNOSIS — E1165 Type 2 diabetes mellitus with hyperglycemia: Secondary | ICD-10-CM | POA: Diagnosis not present

## 2023-04-03 DIAGNOSIS — N1831 Chronic kidney disease, stage 3a: Secondary | ICD-10-CM | POA: Diagnosis not present

## 2023-04-03 DIAGNOSIS — I129 Hypertensive chronic kidney disease with stage 1 through stage 4 chronic kidney disease, or unspecified chronic kidney disease: Secondary | ICD-10-CM | POA: Diagnosis not present

## 2023-04-04 ENCOUNTER — Other Ambulatory Visit (HOSPITAL_COMMUNITY): Payer: Self-pay | Admitting: Surgery

## 2023-04-04 ENCOUNTER — Ambulatory Visit (HOSPITAL_COMMUNITY)
Admission: RE | Admit: 2023-04-04 | Discharge: 2023-04-04 | Disposition: A | Discharge status: Home / Self Care | Payer: Medicare HMO | Source: Ambulatory Visit | Attending: Surgery | Admitting: Surgery

## 2023-04-04 ENCOUNTER — Ambulatory Visit (HOSPITAL_COMMUNITY)
Admission: RE | Admit: 2023-04-04 | Discharge: 2023-04-04 | Disposition: A | Discharge status: Home / Self Care | Payer: Medicare HMO | Source: Ambulatory Visit | Attending: Interventional Radiology | Admitting: Interventional Radiology

## 2023-04-04 ENCOUNTER — Encounter (HOSPITAL_COMMUNITY): Payer: Self-pay

## 2023-04-04 ENCOUNTER — Other Ambulatory Visit (HOSPITAL_COMMUNITY): Payer: Self-pay | Admitting: Interventional Radiology

## 2023-04-04 DIAGNOSIS — Z4803 Encounter for change or removal of drains: Secondary | ICD-10-CM | POA: Insufficient documentation

## 2023-04-04 DIAGNOSIS — L0291 Cutaneous abscess, unspecified: Secondary | ICD-10-CM

## 2023-04-04 DIAGNOSIS — K5720 Diverticulitis of large intestine with perforation and abscess without bleeding: Secondary | ICD-10-CM

## 2023-04-04 DIAGNOSIS — Z4682 Encounter for fitting and adjustment of non-vascular catheter: Secondary | ICD-10-CM | POA: Diagnosis not present

## 2023-04-04 DIAGNOSIS — K651 Peritoneal abscess: Secondary | ICD-10-CM | POA: Diagnosis not present

## 2023-04-04 HISTORY — PX: IR CATHETER TUBE CHANGE: IMG717

## 2023-04-04 MED ORDER — LIDOCAINE HCL 1 % IJ SOLN
INTRAMUSCULAR | Status: AC
  Filled 2023-04-04: qty 20

## 2023-04-04 MED ORDER — IOHEXOL 300 MG/ML  SOLN
50.0000 mL | Freq: Once | INTRAMUSCULAR | Status: AC | PRN
  Administered 2023-04-04: 20 mL

## 2023-04-04 MED ORDER — LIDOCAINE HCL 1 % IJ SOLN
20.0000 mL | Freq: Once | INTRAMUSCULAR | Status: AC
  Administered 2023-04-04: 10 mL

## 2023-04-04 NOTE — Progress Notes (Signed)
Chief Complaint: Patient was seen in consultation today for drain follow up  at the request of White,Christopher M   Referring Physician(s): White,Christopher M   History of Present Illness: Jillian Montgomery is a 67 y.o. female presenting as a self-referral to [facility] to discuss her percutaneous drain.   Her complaint is severe discomfort at all times with the drain.  Secondary complaint is that it "smells terrible".    History of colonic diverticulitis and placement of a drain into a pericolonic abscess on 12/27/22.  Patient had the drain repositioned on 01/03/23 because the drain was retracted and patient developed a subcutaneous fluid collection that was expressed at the time of the drain exchange.  Patient had minimal output at last visit on 01/19/23 and we instructed her to flush the drain regularly.    She has had surgical follow up 02/19/23, with following: 67 year old female recently hospitalized for perforated diverticulitis. She has a persistent drain in place. She will undergo a colonoscopy later this month. Her hemoglobin A1c was greater than 10 during hospitalization. I would recommend less than 8 before proceeding with surgery. We will have her follow-up with her primary care physician to discuss better diabetic control. I will see her back in approximately 6 weeks to check on her progress.  Return in about 6 weeks (around 04/02/2023). (This was telephone visit apparently).   Today she complaints of severe constant pain at the drain site. She has very little output in the bag.  Reports some output at the skin site around the drain.   She has not had any CT performed since July. 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   Last labs were from June.     Past Medical History:  Diagnosis Date   Chronic kidney disease, stage III (moderate) (HCC)    Liver disease    Other and unspecified hyperlipidemia    Pancreatitis    Type II or unspecified type diabetes mellitus  without mention of complication, not stated as uncontrolled    Unspecified essential hypertension     Past Surgical History:  Procedure Laterality Date   IR CATHETER TUBE CHANGE  01/03/2023   IR RADIOLOGIST EVAL & MGMT  01/19/2023   PARTIAL HYSTERECTOMY      Allergies: Lisinopril and Metformin  Medications: Prior to Admission medications   Medication Sig Start Date End Date Taking? Authorizing Provider  atenolol-chlorthalidone (TENORETIC) 50-25 MG tablet Take 1 tablet by mouth daily. 01/18/23   [provider]  atorvastatin (LIPITOR) 20 MG tablet Take 20 mg by mouth daily. 09/05/17   [provider]  BD PEN NEEDLE NANO 2ND GEN 32G X 4 MM MISC AS DIRECTED INVITRO USE WITH LANTUS ONCE A DAY 90 DAYS 01/19/23   [provider]  Continuous Glucose Receiver (DEXCOM G7 RECEIVER) DEVI check sugars at least 4-6 times a day for 30 days 02/07/23   [provider]  insulin detemir (LEVEMIR) 100 UNIT/ML injection Inject 0.1 mLs (10 Units total) into the skin at bedtime. 01/04/23   Rolly Salter, MD  losartan (COZAAR) 100 MG tablet Take 100 mg by mouth daily.    [provider]  ns flush (NS FLUSH) 0.9% SOLN Flush once daily with 5 ml. 01/19/23     oxyCODONE-acetaminophen (PERCOCET/ROXICET) 5-325 MG tablet Take 1 tablet by mouth every 6 (six) hours as needed for severe pain or moderate pain. 01/04/23   Rolly Salter, MD  polyethylene glycol powder (GLYCOLAX/MIRALAX) 17 GM/SCOOP powder Mix  1 capful (17 g total) in 8 oz clear liquid and drink by mouth once daily. 01/05/23   Rolly Salter, MD  TRESIBA FLEXTOUCH 100 UNIT/ML FlexTouch Pen Inject into the skin. 01/30/23   [provider]     Family History  Problem Relation Age of Onset   Breast cancer Mother    Crohn's disease Sister    Colon cancer Paternal Aunt    Diabetes Paternal Aunt        x 2   HIV Son     Social History   Socioeconomic History   Marital status: Single    Spouse name: Not  on file   Number of children: 1   Years of education: Not on file   Highest education level: Not on file  Occupational History   Occupation: retired  Tobacco Use   Smoking status: Never   Smokeless tobacco: Never  Vaping Use   Vaping status: Never Used  Substance and Sexual Activity   Alcohol use: Not Currently   Drug use: Never   Sexual activity: Not on file  Other Topics Concern   Not on file  Social History Narrative   Not on file   Social Determinants of Health   Financial Resource Strain: Not on file  Food Insecurity: No Food Insecurity (12/26/2022)   Hunger Vital Sign    Worried About Running Out of Food in the Last Year: Never true    Ran Out of Food in the Last Year: Never true  Transportation Needs: No Transportation Needs (12/26/2022)   PRAPARE - Administrator, Civil Service (Medical): No    Lack of Transportation (Non-Medical): No  Physical Activity: Not on file  Stress: Not on file  Social Connections: Not on file       Review of Systems: A 12 point ROS discussed and pertinent positives are indicated in the HPI above.  All other systems are negative.  Review of Systems  Vital Signs: There were no vitals taken for this visit.      Physical Exam General: 67 yo female appearing stated age.  Well-developed, well-nourished.  Obvious discomfort, though not ill-appearing.  HEENT: Atraumatic, normocephalic.  Conjugate gaze, extra-ocular motor intact. No scleral icterus or scleral injection. No lesions on external ears, nose, lips, or gums.  Oral mucosa moist, pink.  Neck: Symmetric with no goiter enlargement.  Chest/Lungs:  Symmetric chest with inspiration/expiration.  No labored breathing.    Heart:    No JVD appreciated.  Abdomen:  Severe TTP at the drain site, with induration and firmness of the abd musculature surrounding the exit site.  Granulation tissue at the site. Foul material at the skin site. Scant material in the drain. Pain with drain  manipulation   Genito-urinary: Deferred Neurologic: Alert & Oriented to person, place, and time.   Normal affect and insight.  Appropriate questions.  Moving all 4 extremities with gross sensory intact.       Mallampati Score:     Imaging: No results found.  Labs:  CBC: Recent Labs    01/01/23 0052 01/02/23 0017 01/03/23 0051 01/04/23 0901  WBC 9.6 9.9 8.3 7.9  HGB 10.1* 10.2* 9.9* 10.1*  HCT 32.0* 32.4* 31.1* 32.4*  PLT 276 302 296 308    COAGS: Recent Labs    12/27/22 1013  INR 1.0    BMP: Recent Labs    01/01/23 0052 01/02/23 0017 01/03/23 0051 01/04/23 0901  NA 137 133* 134* 135  K 3.8  3.6 3.2* 3.5  CL 99 99 101 103  CO2 22 23 24 23   GLUCOSE 127* 178* 262* 155*  BUN 14 16 13  5*  CALCIUM 9.1 8.7* 8.3* 8.5*  CREATININE 1.24* 1.24* 1.19* 0.99  GFRNONAA 47* 47* 50* >60    LIVER FUNCTION TESTS: Recent Labs    12/26/22 1341  BILITOT 0.4  AST 14*  ALT 15  ALKPHOS 79  PROT 7.6  ALBUMIN 3.2*    TUMOR MARKERS: No results for input(s): "AFPTM", "CEA", "CA199", "CHROMGRNA" in the last 8760 hours.  Assessment and Plan:  67 yo female with LLQ drain secondary to complicated diverticulitis with prior abscess, now fistula.  Surgery possibly pending though with threshold of hgbA1C of below 8.    Currently with severe, non remitting, life-style limiting pain at the drain site, with concern on physical exam for possible superficial/abd wall abscess.    Plan for drain injection and possible exchange/upsize.    Pending CT after drain injection to evaluate possible superficial abscess.      Electronically Signed: Gilmer Mor 04/04/2023, 10:12 AM   I spent a total of    25 Minutes in face to face in clinical consultation, greater than 50% of which was counseling/coordinating care for LLQ percutaneous drain, possible injection/upsize, as well as concern for superficial abdominal wall infection.

## 2023-04-04 NOTE — Procedures (Signed)
Interventional Radiology Procedure Note  Procedure:   Image guided LLQ drain exchange, new 12 F drain into abscess. To bulb   Complications: None  Recommendations:  - To bulb suction.  - Routine wound care - We are ordering CT abdomen to evaluate possible superficial abscess. IR to review and determine any further needs.  - continue drain care - Do not submerge - Follow up on schedule with surgery    Signed,  Yvone Neu. Loreta Ave, DO

## 2023-04-05 DIAGNOSIS — Z01818 Encounter for other preprocedural examination: Secondary | ICD-10-CM | POA: Diagnosis not present

## 2023-04-05 DIAGNOSIS — R9431 Abnormal electrocardiogram [ECG] [EKG]: Secondary | ICD-10-CM | POA: Diagnosis not present

## 2023-04-05 DIAGNOSIS — Z794 Long term (current) use of insulin: Secondary | ICD-10-CM | POA: Diagnosis not present

## 2023-04-05 DIAGNOSIS — I10 Essential (primary) hypertension: Secondary | ICD-10-CM | POA: Diagnosis not present

## 2023-04-05 DIAGNOSIS — E1169 Type 2 diabetes mellitus with other specified complication: Secondary | ICD-10-CM | POA: Diagnosis not present

## 2023-04-06 ENCOUNTER — Ambulatory Visit (HOSPITAL_COMMUNITY)
Admission: RE | Admit: 2023-04-06 | Discharge: 2023-04-06 | Disposition: A | Discharge status: Home / Self Care | Payer: Medicare HMO | Source: Ambulatory Visit | Attending: Interventional Radiology

## 2023-04-06 ENCOUNTER — Ambulatory Visit (HOSPITAL_COMMUNITY): Payer: Medicare HMO

## 2023-04-06 DIAGNOSIS — N2889 Other specified disorders of kidney and ureter: Secondary | ICD-10-CM | POA: Diagnosis not present

## 2023-04-06 DIAGNOSIS — B9689 Other specified bacterial agents as the cause of diseases classified elsewhere: Secondary | ICD-10-CM | POA: Insufficient documentation

## 2023-04-06 DIAGNOSIS — L0291 Cutaneous abscess, unspecified: Secondary | ICD-10-CM | POA: Diagnosis not present

## 2023-04-06 DIAGNOSIS — K5720 Diverticulitis of large intestine with perforation and abscess without bleeding: Secondary | ICD-10-CM | POA: Insufficient documentation

## 2023-04-06 DIAGNOSIS — Z4682 Encounter for fitting and adjustment of non-vascular catheter: Secondary | ICD-10-CM | POA: Diagnosis not present

## 2023-04-06 DIAGNOSIS — K5780 Diverticulitis of intestine, part unspecified, with perforation and abscess without bleeding: Secondary | ICD-10-CM | POA: Diagnosis not present

## 2023-04-06 LAB — POCT I-STAT CREATININE: Creatinine, Ser: 1.5 mg/dL — ABNORMAL HIGH (ref 0.44–1.00)

## 2023-04-06 MED ORDER — IOHEXOL 350 MG/ML SOLN
75.0000 mL | Freq: Once | INTRAVENOUS | Status: AC | PRN
  Administered 2023-04-06: 75 mL via INTRAVENOUS

## 2023-04-07 DIAGNOSIS — K5720 Diverticulitis of large intestine with perforation and abscess without bleeding: Secondary | ICD-10-CM | POA: Diagnosis not present

## 2023-04-07 DIAGNOSIS — E1165 Type 2 diabetes mellitus with hyperglycemia: Secondary | ICD-10-CM | POA: Diagnosis not present

## 2023-04-07 DIAGNOSIS — E1122 Type 2 diabetes mellitus with diabetic chronic kidney disease: Secondary | ICD-10-CM | POA: Diagnosis not present

## 2023-04-07 DIAGNOSIS — E7849 Other hyperlipidemia: Secondary | ICD-10-CM | POA: Diagnosis not present

## 2023-04-07 DIAGNOSIS — Z4803 Encounter for change or removal of drains: Secondary | ICD-10-CM | POA: Diagnosis not present

## 2023-04-07 DIAGNOSIS — Z794 Long term (current) use of insulin: Secondary | ICD-10-CM | POA: Diagnosis not present

## 2023-04-07 DIAGNOSIS — N1831 Chronic kidney disease, stage 3a: Secondary | ICD-10-CM | POA: Diagnosis not present

## 2023-04-07 DIAGNOSIS — I129 Hypertensive chronic kidney disease with stage 1 through stage 4 chronic kidney disease, or unspecified chronic kidney disease: Secondary | ICD-10-CM | POA: Diagnosis not present

## 2023-04-07 DIAGNOSIS — Z79891 Long term (current) use of opiate analgesic: Secondary | ICD-10-CM | POA: Diagnosis not present

## 2023-04-09 ENCOUNTER — Other Ambulatory Visit (HOSPITAL_COMMUNITY): Payer: Self-pay | Admitting: Interventional Radiology

## 2023-04-09 ENCOUNTER — Telehealth (HOSPITAL_COMMUNITY): Payer: Self-pay

## 2023-04-09 DIAGNOSIS — Z01818 Encounter for other preprocedural examination: Secondary | ICD-10-CM | POA: Diagnosis not present

## 2023-04-09 DIAGNOSIS — L02211 Cutaneous abscess of abdominal wall: Secondary | ICD-10-CM

## 2023-04-09 DIAGNOSIS — R9431 Abnormal electrocardiogram [ECG] [EKG]: Secondary | ICD-10-CM | POA: Diagnosis not present

## 2023-04-09 DIAGNOSIS — I10 Essential (primary) hypertension: Secondary | ICD-10-CM | POA: Diagnosis not present

## 2023-04-09 NOTE — Progress Notes (Unsigned)
-----   Message -----  From: Gilmer Mor, DO  Sent: 04/09/2023   8:04 AM EDT  To: Estell Harpin; Shirlyn Goltz; *   Team, good morning.   I have reviewed Ms Ohlrich's CT.   She has a superficial abscess of the abdominal wall.  This needs drainage.   We need to set her up for:   US guided drainage of the abdominal wall abscess.  Moderate sedation.  No need to hold [facility] or anti-platelets.    Loreta Ave

## 2023-04-09 NOTE — Progress Notes (Unsigned)
Gilmer Mor, DO  Leodis Rains D No it does not.  Any [facility] doctor can handle this.    First available is best, she is symptomatic.  Thank you. JW

## 2023-04-09 NOTE — Telephone Encounter (Signed)
-----   Message from Gilmer Mor sent at 04/09/2023  8:02 AM EDT ----- Team, good morning.   I have reviewed Jillian Montgomery's CT.   She has a superficial abscess of the abdominal wall.  This needs drainage.   We need to set her up for:  US guided drainage of the abdominal wall abscess.  Moderate sedation.  No need to hold [facility] or anti-platelets.    Loreta Ave

## 2023-04-10 ENCOUNTER — Ambulatory Visit: Payer: Self-pay | Admitting: General Surgery

## 2023-04-10 DIAGNOSIS — K5720 Diverticulitis of large intestine with perforation and abscess without bleeding: Secondary | ICD-10-CM | POA: Diagnosis not present

## 2023-04-10 DIAGNOSIS — E1169 Type 2 diabetes mellitus with other specified complication: Secondary | ICD-10-CM

## 2023-04-10 NOTE — H&P (Signed)
REFERRING PHYSICIAN:  Allean Found, *  PROVIDER:  Elenora Gamma, MD  MRN: [id number] DOB: 04-01-1956 DATE OF ENCOUNTER: 04/10/2023  Subjective  Chief Complaint: Wound Check (Diverticulitis of large intestine with abscess/)     History of Present Illness: Jillian Montgomery is a 67 y.o. female who is seen today as an office consultation at the request of Dr. Katrinka Blazing for evaluation of Wound Check (Diverticulitis of large intestine with abscess/) .  67 year old female with chronic kidney disease and type 2 diabetes who presents to the office for evaluation of diverticulitis with abscess.  She was hospitalized in mid June 2020 for with diverticulitis and abscess.  She underwent IR guided drain placement.  She has had this repositioned several times.  There seems to be a persistent abscess cavity and it is thought that the drain is positioned within a large colonic diverticulum.  Most recent hemoglobin A1c was 9.0 down from 10.9.  Colonoscopy completed with no masses noted.  She is having trouble with her drain.  She is having output around the drain but nothing coming through the drain   Review of Systems: A complete review of systems was obtained from the patient.  I have reviewed this information and discussed as appropriate with the patient.  See HPI as well for other ROS.   Medical History: Past Medical History: Diagnosis Date  Chronic kidney disease   Diabetes mellitus without complication (CMS/HHS-HCC)   Glaucoma (increased eye pressure)   Hypertension    Patient Active Problem List Diagnosis  Type 2 diabetes mellitus treated with insulin (CMS/HHS-HCC)  Essential hypertension  Diverticulitis of large intestine with abscess  CKD (chronic kidney disease) stage 3, GFR 30-59 ml/min (CMS/HHS-HCC)   Past Surgical History: Procedure Laterality Date  HYSTERECTOMY      Allergies Allergen Reactions  Lisinopril Angioedema and Swelling   Pt stated, "lips swell  up"  Metformin Diarrhea   Current Outpatient Medications on File Prior to Visit Medication Sig Dispense Refill  atenoloL-chlorthalidone (TENORETIC) 50-25 mg tablet Take 1 tablet by mouth once daily    insulin DETEMIR (LEVEMIR) injection (concentration 100 units/mL) Inject subcutaneously    losartan (COZAAR) 100 MG tablet Take 100 mg by mouth once daily    No current facility-administered medications on file prior to visit.   Family History Problem Relation Age of Onset  High blood pressure (Hypertension) Mother   Breast cancer Mother   High blood pressure (Hypertension) Sister     Social History  Tobacco Use Smoking Status Never Smokeless Tobacco Never    Social History  Socioeconomic History  Marital status: Single Tobacco Use  Smoking status: Never  Smokeless tobacco: Never Vaping Use  Vaping status: Never Used Substance and Sexual Activity  Alcohol use: Never  Drug use: Never  Social Determinants of Health  Food Insecurity: No Food Insecurity (12/26/2022)  [facility]  Hunger Vital Sign   Worried About Running Out of Food in the Last Year: Never true   Ran Out of Food in the Last Year: Never true Transportation Needs: No Transportation Needs (12/26/2022)  Received from [facility] - Transportation   Lack of Transportation (Medical): No   Lack of Transportation (Non-Medical): No  [facility]  Social Network   Objective:   Vitals:   Exam Gen: NAD CV: RRR Lungs: CTA Abd: soft, drain in place with min output   Labs, Imaging and Diagnostic Testing: CT reviewed.  Hospital notes reviewed as well.  GI notes reviewed.  Patient had colonoscopy performed March 07, 2023.  This showed sigmoid diverticulosis with evidence of recent diverticulitis.  There was also a slight terminal ileal stricture which was biopsied.  Assessment and Plan: There are no diagnoses linked to this encounter.   67 year old female  hospitalized for perforated diverticulitis.  She has a persistent drain in place.  She will undergo a colonoscopy later this month.  Her hemoglobin A1c was greater than 10 during hospitalization.  I have recommended less than 8 before proceeding with surgery.  She has seen her primary care physician and her glucose control has been adjusted.  Her hemoglobin A1c is down to 9.  Her blood sugar levels are better on the new regimen.  I think it would be reasonable to proceed with surgery at this point.  The surgery and anatomy were described to the patient as well as the risks of surgery and the possible complications.  These include: Bleeding, deep abdominal infections and possible wound complications such as hernia and infection, damage to adjacent structures, leak of surgical connections, which can lead to other surgeries and possibly an ostomy, possible need for other procedures, such as abscess drains in radiology, possible prolonged hospital stay, possible diarrhea from removal of part of the colon, possible constipation from narcotics, possible bowel, bladder or sexual dysfunction if having rectal surgery, prolonged fatigue/weakness or appetite loss, possible early recurrence of of disease, possible complications of their medical problems such as heart disease or arrhythmias or lung problems, death (less than 1%). I believe the patient understands and wishes to proceed with the surgery.     Vanita Panda, MD Colon and Rectal Surgery [facility]

## 2023-04-11 DIAGNOSIS — R9431 Abnormal electrocardiogram [ECG] [EKG]: Secondary | ICD-10-CM | POA: Diagnosis not present

## 2023-04-11 DIAGNOSIS — Z01818 Encounter for other preprocedural examination: Secondary | ICD-10-CM | POA: Diagnosis not present

## 2023-04-12 DIAGNOSIS — E7849 Other hyperlipidemia: Secondary | ICD-10-CM | POA: Diagnosis not present

## 2023-04-12 DIAGNOSIS — K5720 Diverticulitis of large intestine with perforation and abscess without bleeding: Secondary | ICD-10-CM | POA: Diagnosis not present

## 2023-04-12 DIAGNOSIS — I129 Hypertensive chronic kidney disease with stage 1 through stage 4 chronic kidney disease, or unspecified chronic kidney disease: Secondary | ICD-10-CM | POA: Diagnosis not present

## 2023-04-12 DIAGNOSIS — N1831 Chronic kidney disease, stage 3a: Secondary | ICD-10-CM | POA: Diagnosis not present

## 2023-04-12 DIAGNOSIS — Z4803 Encounter for change or removal of drains: Secondary | ICD-10-CM | POA: Diagnosis not present

## 2023-04-12 DIAGNOSIS — E1165 Type 2 diabetes mellitus with hyperglycemia: Secondary | ICD-10-CM | POA: Diagnosis not present

## 2023-04-12 DIAGNOSIS — Z79891 Long term (current) use of opiate analgesic: Secondary | ICD-10-CM | POA: Diagnosis not present

## 2023-04-12 DIAGNOSIS — E1122 Type 2 diabetes mellitus with diabetic chronic kidney disease: Secondary | ICD-10-CM | POA: Diagnosis not present

## 2023-04-12 DIAGNOSIS — Z794 Long term (current) use of insulin: Secondary | ICD-10-CM | POA: Diagnosis not present

## 2023-04-18 DIAGNOSIS — E7849 Other hyperlipidemia: Secondary | ICD-10-CM | POA: Diagnosis not present

## 2023-04-18 DIAGNOSIS — E1122 Type 2 diabetes mellitus with diabetic chronic kidney disease: Secondary | ICD-10-CM | POA: Diagnosis not present

## 2023-04-18 DIAGNOSIS — Z79891 Long term (current) use of opiate analgesic: Secondary | ICD-10-CM | POA: Diagnosis not present

## 2023-04-18 DIAGNOSIS — K5720 Diverticulitis of large intestine with perforation and abscess without bleeding: Secondary | ICD-10-CM | POA: Diagnosis not present

## 2023-04-18 DIAGNOSIS — Z794 Long term (current) use of insulin: Secondary | ICD-10-CM | POA: Diagnosis not present

## 2023-04-18 DIAGNOSIS — N1831 Chronic kidney disease, stage 3a: Secondary | ICD-10-CM | POA: Diagnosis not present

## 2023-04-18 DIAGNOSIS — I129 Hypertensive chronic kidney disease with stage 1 through stage 4 chronic kidney disease, or unspecified chronic kidney disease: Secondary | ICD-10-CM | POA: Diagnosis not present

## 2023-04-18 DIAGNOSIS — E1165 Type 2 diabetes mellitus with hyperglycemia: Secondary | ICD-10-CM | POA: Diagnosis not present

## 2023-04-18 DIAGNOSIS — Z4803 Encounter for change or removal of drains: Secondary | ICD-10-CM | POA: Diagnosis not present

## 2023-04-19 DIAGNOSIS — H2513 Age-related nuclear cataract, bilateral: Secondary | ICD-10-CM | POA: Diagnosis not present

## 2023-04-27 DIAGNOSIS — Z79891 Long term (current) use of opiate analgesic: Secondary | ICD-10-CM | POA: Diagnosis not present

## 2023-04-27 DIAGNOSIS — E1165 Type 2 diabetes mellitus with hyperglycemia: Secondary | ICD-10-CM | POA: Diagnosis not present

## 2023-04-27 DIAGNOSIS — Z794 Long term (current) use of insulin: Secondary | ICD-10-CM | POA: Diagnosis not present

## 2023-04-27 DIAGNOSIS — I129 Hypertensive chronic kidney disease with stage 1 through stage 4 chronic kidney disease, or unspecified chronic kidney disease: Secondary | ICD-10-CM | POA: Diagnosis not present

## 2023-04-27 DIAGNOSIS — Z4803 Encounter for change or removal of drains: Secondary | ICD-10-CM | POA: Diagnosis not present

## 2023-04-27 DIAGNOSIS — K5720 Diverticulitis of large intestine with perforation and abscess without bleeding: Secondary | ICD-10-CM | POA: Diagnosis not present

## 2023-04-27 DIAGNOSIS — E1122 Type 2 diabetes mellitus with diabetic chronic kidney disease: Secondary | ICD-10-CM | POA: Diagnosis not present

## 2023-04-27 DIAGNOSIS — E7849 Other hyperlipidemia: Secondary | ICD-10-CM | POA: Diagnosis not present

## 2023-04-27 DIAGNOSIS — N1831 Chronic kidney disease, stage 3a: Secondary | ICD-10-CM | POA: Diagnosis not present

## 2023-05-02 ENCOUNTER — Other Ambulatory Visit (HOSPITAL_COMMUNITY): Payer: Self-pay | Admitting: Interventional Radiology

## 2023-05-02 ENCOUNTER — Inpatient Hospital Stay (HOSPITAL_COMMUNITY): Admission: RE | Admit: 2023-05-02 | Payer: Medicare HMO | Source: Ambulatory Visit

## 2023-05-02 DIAGNOSIS — Z794 Long term (current) use of insulin: Secondary | ICD-10-CM | POA: Diagnosis not present

## 2023-05-02 DIAGNOSIS — E1122 Type 2 diabetes mellitus with diabetic chronic kidney disease: Secondary | ICD-10-CM | POA: Diagnosis not present

## 2023-05-02 DIAGNOSIS — L0291 Cutaneous abscess, unspecified: Secondary | ICD-10-CM

## 2023-05-02 DIAGNOSIS — E7849 Other hyperlipidemia: Secondary | ICD-10-CM | POA: Diagnosis not present

## 2023-05-02 DIAGNOSIS — Z79891 Long term (current) use of opiate analgesic: Secondary | ICD-10-CM | POA: Diagnosis not present

## 2023-05-02 DIAGNOSIS — K5720 Diverticulitis of large intestine with perforation and abscess without bleeding: Secondary | ICD-10-CM | POA: Diagnosis not present

## 2023-05-02 DIAGNOSIS — I129 Hypertensive chronic kidney disease with stage 1 through stage 4 chronic kidney disease, or unspecified chronic kidney disease: Secondary | ICD-10-CM | POA: Diagnosis not present

## 2023-05-02 DIAGNOSIS — E1165 Type 2 diabetes mellitus with hyperglycemia: Secondary | ICD-10-CM | POA: Diagnosis not present

## 2023-05-02 DIAGNOSIS — Z4803 Encounter for change or removal of drains: Secondary | ICD-10-CM | POA: Diagnosis not present

## 2023-05-02 DIAGNOSIS — N1831 Chronic kidney disease, stage 3a: Secondary | ICD-10-CM | POA: Diagnosis not present

## 2023-05-04 ENCOUNTER — Encounter (HOSPITAL_COMMUNITY): Payer: Self-pay | Admitting: Radiology

## 2023-05-04 ENCOUNTER — Ambulatory Visit (HOSPITAL_COMMUNITY)
Admission: RE | Admit: 2023-05-04 | Discharge: 2023-05-04 | Disposition: A | Discharge status: Home / Self Care | Payer: Medicare HMO | Source: Ambulatory Visit | Attending: Interventional Radiology | Admitting: Interventional Radiology

## 2023-05-04 ENCOUNTER — Other Ambulatory Visit (HOSPITAL_COMMUNITY): Payer: Self-pay | Admitting: Interventional Radiology

## 2023-05-04 DIAGNOSIS — L0291 Cutaneous abscess, unspecified: Secondary | ICD-10-CM

## 2023-05-04 HISTORY — PX: IR PATIENT EVAL TECH 0-60 MINS: IMG5564

## 2023-05-04 NOTE — Procedures (Signed)
Replace JP Bulb only. Patient scheduled for CT ABD and Pelvis in 1 week. Per [employer]

## 2023-05-15 ENCOUNTER — Other Ambulatory Visit (HOSPITAL_COMMUNITY): Payer: Self-pay | Admitting: Interventional Radiology

## 2023-05-15 ENCOUNTER — Telehealth (HOSPITAL_COMMUNITY): Payer: Self-pay

## 2023-05-15 DIAGNOSIS — K5720 Diverticulitis of large intestine with perforation and abscess without bleeding: Secondary | ICD-10-CM

## 2023-05-15 NOTE — Telephone Encounter (Signed)
-----   Message from [place] sent at 05/04/2023 10:01 AM EDT ----- [place] wants this patient to be scheduled to come back in a week for CT abdomen and pelvic (with IV contrast) and drain injection. Could one of you please schedule that? Thank YOU!

## 2023-05-15 NOTE — Telephone Encounter (Signed)
Called to give appt info, no answer, left vm. AB

## 2023-05-18 NOTE — Patient Instructions (Addendum)
DUE TO COVID-19 ONLY TWO VISITORS  (aged 69 and older)  ARE ALLOWED TO COME WITH YOU AND STAY IN THE WAITING ROOM ONLY DURING PRE OP AND PROCEDURE.   **NO VISITORS ARE ALLOWED IN THE SHORT STAY AREA OR RECOVERY ROOM!!**  IF YOU WILL BE ADMITTED INTO THE HOSPITAL YOU ARE ALLOWED ONLY FOUR SUPPORT PEOPLE DURING VISITATION HOURS ONLY (7 AM -8PM)   The support person(s) must pass our screening, gel in and out, and wear a mask at all times, including in the patient's room. Patients must also wear a mask when staff or their support person are in the room. Visitors GUEST BADGE MUST BE WORN VISIBLY  One adult visitor may remain with you overnight and MUST be in the room by 8 P.M.     Your procedure is scheduled on: 05/31/23   Report to [facility] Main Entrance    Report to admitting at : 10: 15 AM   Call this number if you have problems the morning of surgery [phone]   Clear liquids starting the day before surgery until : 9:30 AM DAY OF SURGERY. Drink plenty clear liquids the day before surgery.  Water Black Coffee (sugar ok, NO MILK/CREAM OR CREAMERS)  Tea (sugar ok, NO MILK/CREAM OR CREAMERS) regular and decaf                             Plain Jell-O (NO RED)                                           Fruit ices (not with fruit pulp, NO RED)                                     Popsicles (NO RED)                                                                  Juice: apple, WHITE grape, WHITE cranberry Sports drinks like Gatorade (NO RED)              Drink 2 Ensure/G2 drinks AT 10:00 PM the night before surgery.     The day of surgery:  Drink ONE (1) Pre-Surgery Clear G2 at : 9:30 AM the morning of surgery. Drink in one sitting. Do not sip.  This drink was given to you during your hospital  pre-op appointment visit. Nothing else to drink after completing the  Pre-Surgery Clear Ensure or G2.          If you have questions, please contact your surgeon's office.  FOLLOW  BOWEL PREP AND ANY ADDITIONAL PRE OP INSTRUCTIONS YOU RECEIVED FROM YOUR SURGEON'S OFFICE!!!   Oral Hygiene is also important to reduce your risk of infection.                                    Remember - BRUSH YOUR TEETH THE MORNING OF SURGERY WITH YOUR REGULAR TOOTHPASTE  DENTURES WILL  BE REMOVED PRIOR TO SURGERY PLEASE DO NOT APPLY "Poly grip" OR ADHESIVES!!!   Do NOT smoke after Midnight   Take these medicines the morning of surgery with A SIP OF WATER: NONE.  How to Manage Your Diabetes Before and After Surgery  Why is it important to control my blood sugar before and after surgery? Improving blood sugar levels before and after surgery helps healing and can limit problems. A way of improving blood sugar control is eating a healthy diet by:  Eating less sugar and carbohydrates  Increasing activity/exercise  Talking with your doctor about reaching your blood sugar goals High blood sugars (greater than 180 mg/dL) can raise your risk of infections and slow your recovery, so you will need to focus on controlling your diabetes during the weeks before surgery. Make sure that the doctor who takes care of your diabetes knows about your planned surgery including the date and location.  How do I manage my blood sugar before surgery? Check your blood sugar at least 4 times a day, starting 2 days before surgery, to make sure that the level is not too high or low. Check your blood sugar the morning of your surgery when you wake up and every 2 hours until you get to the Short Stay unit. If your blood sugar is less than 70 mg/dL, you will need to treat for low blood sugar: Do not take insulin. Treat a low blood sugar (less than 70 mg/dL) with  cup of clear juice (cranberry or apple), 4 glucose tablets, OR glucose gel. Recheck blood sugar in 15 minutes after treatment (to make sure it is greater than 70 mg/dL). If your blood sugar is not greater than 70 mg/dL on recheck, call [phone] for  further instructions. Report your blood sugar to the short stay nurse when you get to Short Stay.  If you are admitted to the hospital after surgery: Your blood sugar will be checked by the staff and you will probably be given insulin after surgery (instead of oral diabetes medicines) to make sure you have good blood sugar levels. The goal for blood sugar control after surgery is 80-180 mg/dL.   WHAT DO I DO ABOUT MY DIABETES MEDICATION?  THE NIGHT BEFORE SURGERY, take ONLY half of tresiba insulin dose (10 units).     THE MORNING OF SURGERY, DO NOT TAKE ANY ORAL DIABETIC MEDICATIONS DAY OF YOUR SURGERY  Bring CPAP mask and tubing day of surgery.                              You may not have any metal on your body including hair pins, jewelry, and body piercing             Do not wear make-up, lotions, powders, perfumes/cologne, or deodorant  Do not wear nail polish including gel and S&S, artificial/acrylic nails, or any other type of covering on natural nails including finger and toenails. If you have artificial nails, gel coating, etc. that needs to be removed by a nail salon please have this removed prior to surgery or surgery may need to be canceled/ delayed if the surgeon/ anesthesia feels like they are unable to be safely monitored.   Do not shave  48 hours prior to surgery.    Do not bring valuables to the hospital. [facility] IS NOT             RESPONSIBLE   FOR VALUABLES.  Contacts, glasses, or bridgework may not be worn into surgery.   Bring small overnight bag day of surgery.   DO NOT BRING YOUR HOME MEDICATIONS TO THE HOSPITAL. PHARMACY WILL DISPENSE MEDICATIONS LISTED ON YOUR MEDICATION LIST TO YOU DURING YOUR ADMISSION IN THE HOSPITAL!    Patients discharged on the day of surgery will not be allowed to drive home.  Someone NEEDS to stay with you for the first 24 hours after anesthesia.   Special Instructions: Bring a copy of your healthcare power of attorney and  living will documents         the day of surgery if you haven't scanned them before.              Please read over the following fact sheets you were given: IF YOU HAVE QUESTIONS ABOUT YOUR PRE-OP INSTRUCTIONS PLEASE CALL [phone]    [facility] - Preparing for Surgery Before surgery, you can play an important role.  Because skin is not sterile, your skin needs to be as free of germs as possible.  You can reduce the number of germs on your skin by washing with CHG (chlorahexidine gluconate) soap before surgery.  CHG is an antiseptic cleaner which kills germs and bonds with the skin to continue killing germs even after washing. Please DO NOT use if you have an allergy to CHG or antibacterial soaps.  If your skin becomes reddened/irritated stop using the CHG and inform your nurse when you arrive at Short Stay. Do not shave (including legs and underarms) for at least 48 hours prior to the first CHG shower.  You may shave your face/neck. Please follow these instructions carefully:  1.  Shower with CHG Soap the night before surgery and the  morning of Surgery.  2.  If you choose to wash your hair, wash your hair first as usual with your  normal  shampoo.  3.  After you shampoo, rinse your hair and body thoroughly to remove the  shampoo.                           4.  Use CHG as you would any other liquid soap.  You can apply chg directly  to the skin and wash                       Gently with a scrungie or clean washcloth.  5.  Apply the CHG Soap to your body ONLY FROM THE NECK DOWN.   Do not use on face/ open                           Wound or open sores. Avoid contact with eyes, ears mouth and genitals (private parts).                       Wash face,  Genitals (private parts) with your normal soap.             6.  Wash thoroughly, paying special attention to the area where your surgery  will be performed.  7.  Thoroughly rinse your body with warm water from the neck down.  8.  DO NOT shower/wash  with your normal soap after using and rinsing off  the CHG Soap.                9.  Pat yourself dry with a  clean towel.            10.  Wear clean pajamas.            11.  Place clean sheets on your bed the night of your first shower and do not  sleep with pets. Day of Surgery : Do not apply any lotions/deodorants the morning of surgery.  Please wear clean clothes to the hospital/surgery center.  FAILURE TO FOLLOW THESE INSTRUCTIONS MAY RESULT IN THE CANCELLATION OF YOUR SURGERY PATIENT SIGNATURE_________________________________  NURSE SIGNATURE__________________________________  ________________________________________________________________________  Rogelia Mire  An incentive spirometer is a tool that can help keep your lungs clear and active. This tool measures how well you are filling your lungs with each breath. Taking long deep breaths may help reverse or decrease the chance of developing breathing (pulmonary) problems (especially infection) following: A long period of time when you are unable to move or be active. BEFORE THE PROCEDURE  If the spirometer includes an indicator to show your best effort, your nurse or respiratory therapist will set it to a desired goal. If possible, sit up straight or lean slightly forward. Try not to slouch. Hold the incentive spirometer in an upright position. INSTRUCTIONS FOR USE  Sit on the edge of your bed if possible, or sit up as far as you can in bed or on a chair. Hold the incentive spirometer in an upright position. Breathe out normally. Place the mouthpiece in your mouth and seal your lips tightly around it. Breathe in slowly and as deeply as possible, raising the piston or the ball toward the top of the column. Hold your breath for 3-5 seconds or for as long as possible. Allow the piston or ball to fall to the bottom of the column. Remove the mouthpiece from your mouth and breathe out normally. Rest for a few seconds and repeat  Steps 1 through 7 at least 10 times every 1-2 hours when you are awake. Take your time and take a few normal breaths between deep breaths. The spirometer may include an indicator to show your best effort. Use the indicator as a goal to work toward during each repetition. After each set of 10 deep breaths, practice coughing to be sure your lungs are clear. If you have an incision (the cut made at the time of surgery), support your incision when coughing by placing a pillow or rolled up towels firmly against it. Once you are able to get out of bed, walk around indoors and cough well. You may stop using the incentive spirometer when instructed by your caregiver.  RISKS AND COMPLICATIONS Take your time so you do not get dizzy or light-headed. If you are in pain, you may need to take or ask for pain medication before doing incentive spirometry. It is harder to take a deep breath if you are having pain. AFTER USE Rest and breathe slowly and easily. It can be helpful to keep track of a log of your progress. Your caregiver can provide you with a simple table to help with this. If you are using the spirometer at home, follow these instructions: SEEK MEDICAL CARE IF:  You are having difficultly using the spirometer. You have trouble using the spirometer as often as instructed. Your pain medication is not giving enough relief while using the spirometer. You develop fever of 100.5 F (38.1 C) or higher. SEEK IMMEDIATE MEDICAL CARE IF:  You cough up bloody sputum that had not been present before. You develop fever of 102  F (38.9 C) or greater. You develop worsening pain at or near the incision site. MAKE SURE YOU:  Understand these instructions. Will watch your condition. Will get help right away if you are not doing well or get worse. Document Released: 11/13/2006 Document Revised: 09/25/2011 Document Reviewed: 01/14/2007 [facility] Patient Information 2014 [place],  [place].   ________________________________________________________________________

## 2023-05-21 ENCOUNTER — Encounter (HOSPITAL_COMMUNITY): Payer: Self-pay

## 2023-05-21 ENCOUNTER — Encounter (HOSPITAL_COMMUNITY)
Admission: RE | Admit: 2023-05-21 | Discharge: 2023-05-21 | Disposition: A | Discharge status: Home / Self Care | Payer: Medicare HMO | Source: Ambulatory Visit | Attending: General Surgery | Admitting: General Surgery

## 2023-05-21 ENCOUNTER — Other Ambulatory Visit: Payer: Self-pay

## 2023-05-21 VITALS — BP 134/62 | HR 50 | Temp 97.5°F | Ht 62.0 in | Wt 143.0 lb

## 2023-05-21 DIAGNOSIS — N183 Chronic kidney disease, stage 3 unspecified: Secondary | ICD-10-CM | POA: Insufficient documentation

## 2023-05-21 DIAGNOSIS — I129 Hypertensive chronic kidney disease with stage 1 through stage 4 chronic kidney disease, or unspecified chronic kidney disease: Secondary | ICD-10-CM | POA: Insufficient documentation

## 2023-05-21 DIAGNOSIS — E1122 Type 2 diabetes mellitus with diabetic chronic kidney disease: Secondary | ICD-10-CM | POA: Diagnosis not present

## 2023-05-21 DIAGNOSIS — E1169 Type 2 diabetes mellitus with other specified complication: Secondary | ICD-10-CM

## 2023-05-21 DIAGNOSIS — K5790 Diverticulosis of intestine, part unspecified, without perforation or abscess without bleeding: Secondary | ICD-10-CM | POA: Diagnosis not present

## 2023-05-21 DIAGNOSIS — I10 Essential (primary) hypertension: Secondary | ICD-10-CM

## 2023-05-21 DIAGNOSIS — Z01812 Encounter for preprocedural laboratory examination: Secondary | ICD-10-CM | POA: Insufficient documentation

## 2023-05-21 DIAGNOSIS — Z794 Long term (current) use of insulin: Secondary | ICD-10-CM | POA: Diagnosis not present

## 2023-05-21 DIAGNOSIS — K632 Fistula of intestine: Secondary | ICD-10-CM | POA: Diagnosis not present

## 2023-05-21 DIAGNOSIS — Z01818 Encounter for other preprocedural examination: Secondary | ICD-10-CM

## 2023-05-21 DIAGNOSIS — N1831 Chronic kidney disease, stage 3a: Secondary | ICD-10-CM

## 2023-05-21 LAB — BASIC METABOLIC PANEL
Anion gap: 10 (ref 5–15)
BUN: 44 mg/dL — ABNORMAL HIGH (ref 8–23)
CO2: 19 mmol/L — ABNORMAL LOW (ref 22–32)
Calcium: 9 mg/dL (ref 8.9–10.3)
Chloride: 102 mmol/L (ref 98–111)
Creatinine, Ser: 1.35 mg/dL — ABNORMAL HIGH (ref 0.44–1.00)
GFR, Estimated: 43 mL/min — ABNORMAL LOW (ref >60)
Glucose, Bld: 125 mg/dL — ABNORMAL HIGH (ref 70–99)
Potassium: 4 mmol/L (ref 3.5–5.1)
Sodium: 131 mmol/L — ABNORMAL LOW (ref 135–145)

## 2023-05-21 LAB — CBC
HCT: 35 % — ABNORMAL LOW (ref 36.0–46.0)
Hemoglobin: 10.7 g/dL — ABNORMAL LOW (ref 12.0–15.0)
MCH: 26 pg (ref 26.0–34.0)
MCHC: 30.6 g/dL (ref 30.0–36.0)
MCV: 85 fL (ref 80.0–100.0)
Platelets: 370 10*3/uL (ref 150–400)
RBC: 4.12 MIL/uL (ref 3.87–5.11)
RDW: 15.1 % (ref 11.5–15.5)
WBC: 7.5 10*3/uL (ref 4.0–10.5)
nRBC: 0 % (ref 0.0–0.2)

## 2023-05-21 LAB — GLUCOSE, CAPILLARY: Glucose-Capillary: 124 mg/dL — ABNORMAL HIGH (ref 70–99)

## 2023-05-21 LAB — HEMOGLOBIN A1C
Hgb A1c MFr Bld: 7.6 % — ABNORMAL HIGH (ref 4.8–5.6)
Mean Plasma Glucose: 171.42 mg/dL

## 2023-05-21 NOTE — Progress Notes (Addendum)
For Anesthesia: PCP - Merri Brunette, MD  Cardiologist - Dr. Fawn Kirk. LOV: 04/05/23: clearance  Bowel Prep reminder: Reviewed  Chest x-ray -  EKG - 04/05/23 Stress Test -  ECHO - 04/09/23 Cardiac Cath -  Pacemaker/ICD device last checked: Pacemaker orders received: Device Rep notified:  Spinal Cord Stimulator: N/A  Sleep Study - N/A CPAP -   Fasting Blood Sugar - 90's Checks Blood Sugar : Dexcom continuous monitor. Date and result of last Hgb A1c-  Last dose of GLP1 agonist- N/A GLP1 instructions:   Last dose of SGLT-2 inhibitors- N/A SGLT-2 instructions:   Blood Thinner Instructions: N/A Aspirin Instructions: Last Dose:  Activity level: Can go up a flight of stairs and activities of daily living without stopping and without chest pain and/or shortness of breath   Able to exercise without chest pain and/or shortness of breath  Anesthesia review: Hx: HTN,CKD III,DIA,Abnormal EKG.  Patient denies shortness of breath, fever, cough and chest pain at PAT appointment   Patient verbalized understanding of instructions that were given to them at the PAT appointment. Patient was also instructed that they will need to review over the PAT instructions again at home before surgery.

## 2023-05-22 ENCOUNTER — Ambulatory Visit (HOSPITAL_COMMUNITY)
Admission: RE | Admit: 2023-05-22 | Discharge: 2023-05-22 | Disposition: A | Discharge status: Home / Self Care | Payer: Medicare HMO | Source: Ambulatory Visit | Attending: Interventional Radiology

## 2023-05-22 ENCOUNTER — Ambulatory Visit (HOSPITAL_COMMUNITY)
Admission: RE | Admit: 2023-05-22 | Discharge: 2023-05-22 | Disposition: A | Discharge status: Home / Self Care | Payer: Medicare HMO | Source: Ambulatory Visit | Attending: Interventional Radiology | Admitting: Interventional Radiology

## 2023-05-22 ENCOUNTER — Other Ambulatory Visit (HOSPITAL_COMMUNITY): Payer: Self-pay | Admitting: Interventional Radiology

## 2023-05-22 DIAGNOSIS — R935 Abnormal findings on diagnostic imaging of other abdominal regions, including retroperitoneum: Secondary | ICD-10-CM | POA: Diagnosis not present

## 2023-05-22 DIAGNOSIS — K5720 Diverticulitis of large intestine with perforation and abscess without bleeding: Secondary | ICD-10-CM

## 2023-05-22 DIAGNOSIS — Z48815 Encounter for surgical aftercare following surgery on the digestive system: Secondary | ICD-10-CM | POA: Diagnosis not present

## 2023-05-22 DIAGNOSIS — N183 Chronic kidney disease, stage 3 unspecified: Secondary | ICD-10-CM | POA: Diagnosis not present

## 2023-05-22 DIAGNOSIS — K632 Fistula of intestine: Secondary | ICD-10-CM | POA: Diagnosis not present

## 2023-05-22 DIAGNOSIS — E1122 Type 2 diabetes mellitus with diabetic chronic kidney disease: Secondary | ICD-10-CM | POA: Diagnosis not present

## 2023-05-22 DIAGNOSIS — E7849 Other hyperlipidemia: Secondary | ICD-10-CM | POA: Insufficient documentation

## 2023-05-22 DIAGNOSIS — Z794 Long term (current) use of insulin: Secondary | ICD-10-CM | POA: Insufficient documentation

## 2023-05-22 DIAGNOSIS — Z4803 Encounter for change or removal of drains: Secondary | ICD-10-CM | POA: Diagnosis present

## 2023-05-22 DIAGNOSIS — I129 Hypertensive chronic kidney disease with stage 1 through stage 4 chronic kidney disease, or unspecified chronic kidney disease: Secondary | ICD-10-CM | POA: Insufficient documentation

## 2023-05-22 DIAGNOSIS — D3502 Benign neoplasm of left adrenal gland: Secondary | ICD-10-CM | POA: Diagnosis not present

## 2023-05-22 MED ORDER — IOHEXOL 350 MG/ML SOLN
75.0000 mL | Freq: Once | INTRAVENOUS | Status: AC | PRN
  Administered 2023-05-22: 75 mL via INTRAVENOUS

## 2023-05-22 NOTE — Progress Notes (Signed)
Referring Physician(s): Romie Levee MD  Chief Complaint: The patient is seen in follow up today s/p pericolonic abscess drain placed in IR 12/27/22. Most recent imaging 04/04/23:  Status post drain injection and exchange of left lower quadrant abscess drain for a new 12 French drain into residual abscess and/or superinfected giant diverticula. +fistula still evident  History of present illness:  Pt is here today for imaging and evaluation  Pt states she is still flushing daily OP is purulent and odorous Pt follows with Dr Clovis Pu For surgery 05/31/23  CT done today  Past Medical History:  Diagnosis Date   Chronic kidney disease, stage III (moderate) (HCC)    Liver disease    Other and unspecified hyperlipidemia    Pancreatitis    Type II or unspecified type diabetes mellitus without mention of complication, not stated as uncontrolled    Unspecified essential hypertension     Past Surgical History:  Procedure Laterality Date   IR CATHETER TUBE CHANGE  01/03/2023   IR CATHETER TUBE CHANGE  04/04/2023   IR PATIENT EVAL TECH 0-60 MINS  05/04/2023   IR RADIOLOGIST EVAL & MGMT  01/19/2023   PARTIAL HYSTERECTOMY      Allergies: Lisinopril and Metformin  Medications: Prior to Admission medications   Medication Sig Start Date End Date Taking? Authorizing Provider  atenolol-chlorthalidone (TENORETIC) 50-25 MG tablet Take 1 tablet by mouth daily. 01/18/23   [provider]  BD PEN NEEDLE NANO 2ND GEN 32G X 4 MM MISC AS DIRECTED INVITRO USE WITH LANTUS ONCE A DAY 90 DAYS 01/19/23   [provider]  Continuous Glucose Receiver (DEXCOM G7 RECEIVER) DEVI check sugars at least 4-6 times a day for 30 days 02/07/23   [provider]  insulin detemir (LEVEMIR) 100 UNIT/ML injection Inject 0.1 mLs (10 Units total) into the skin at bedtime. Patient not taking: Reported on 05/15/2023 01/04/23   Rolly Salter, MD  losartan (COZAAR) 100 MG tablet Take 100 mg by  mouth daily.    [provider]  ns flush (NS FLUSH) 0.9% SOLN Flush once daily with 5 ml. Patient not taking: Reported on 05/15/2023 01/19/23     oxyCODONE-acetaminophen (PERCOCET/ROXICET) 5-325 MG tablet Take 1 tablet by mouth every 6 (six) hours as needed for severe pain or moderate pain. Patient not taking: Reported on 05/15/2023 01/04/23   Rolly Salter, MD  polyethylene glycol powder (GLYCOLAX/MIRALAX) 17 GM/SCOOP powder Mix 1 capful (17 g total) in 8 oz clear liquid and drink by mouth once daily. Patient not taking: Reported on 05/15/2023 01/05/23   Rolly Salter, MD  TRESIBA FLEXTOUCH 100 UNIT/ML FlexTouch Pen Inject 20 Units into the skin at bedtime. 01/30/23   [provider]     Family History  Problem Relation Age of Onset   Breast cancer Mother    Crohn's disease Sister    Colon cancer Paternal Aunt    Diabetes Paternal Aunt        x 2   HIV Son     Social History   Socioeconomic History   Marital status: Single    Spouse name: Not on file   Number of children: 1   Years of education: Not on file   Highest education level: Not on file  Occupational History   Occupation: retired  Tobacco Use   Smoking status: Never   Smokeless tobacco: Never  Vaping Use   Vaping status: Never Used  Substance and Sexual Activity  Alcohol use: Not Currently   Drug use: Never   Sexual activity: Not on file  Other Topics Concern   Not on file  Social History Narrative   Not on file   Social Determinants of Health   Financial Resource Strain: Not on file  Food Insecurity: No Food Insecurity (12/26/2022)   Hunger Vital Sign    Worried About Running Out of Food in the Last Year: Never true    Ran Out of Food in the Last Year: Never true  Transportation Needs: No Transportation Needs (12/26/2022)   PRAPARE - Administrator, Civil Service (Medical): No    Lack of Transportation (Non-Medical): No  Physical Activity: Not on file  Stress: Not on file   Social Connections: Unknown (04/04/2023)   [facility]   Social Network    Social Network: Not on file     Vital Signs: There were no vitals taken for this visit.  Physical Exam Skin:    General: Skin is warm and dry.     Comments: Site is clean Small amount of drainage evident on dressing Very odorous OP milky brown in JP 20 cc in JP Site is clean; no sign of infection No bleeding  Area was cleaned with warm water New stat lock placed New dressing placed    Neurological:     Mental Status: She is oriented to person, place, and time.  Psychiatric:        Behavior: Behavior normal.     Imaging: No results found.  Labs:  CBC: Recent Labs    01/02/23 0017 01/03/23 0051 01/04/23 0901 05/21/23 0930  WBC 9.9 8.3 7.9 7.5  HGB 10.2* 9.9* 10.1* 10.7*  HCT 32.4* 31.1* 32.4* 35.0*  PLT 302 296 308 370    COAGS: Recent Labs    12/27/22 1013  INR 1.0    BMP: Recent Labs    01/02/23 0017 01/03/23 0051 01/04/23 0901 04/06/23 0910 05/21/23 0930  NA 133* 134* 135  --  131*  K 3.6 3.2* 3.5  --  4.0  CL 99 101 103  --  102  CO2 23 24 23   --  19*  GLUCOSE 178* 262* 155*  --  125*  BUN 16 13 5*  --  44*  CALCIUM 8.7* 8.3* 8.5*  --  9.0  CREATININE 1.24* 1.19* 0.99 1.50* 1.35*  GFRNONAA 47* 50* >60  --  43*    LIVER FUNCTION TESTS: Recent Labs    12/26/22 1341  BILITOT 0.4  AST 14*  ALT 15  ALKPHOS 79  PROT 7.6  ALBUMIN 3.2*    Assessment:  CT was reviewed with Dr Milford Cage States collection has not changed much.  He feels drain injection is not needed today Skin site was evaluated by me- New dressing placed Pt is scheduled for OR with Dr Clovis Pu 05/31/23 I did give pt 4 new flushes to take home with her. Decrease flushes to 2-3x/week --- she will see Dr Maisie Fus 11/14. She has good understanding of plan   Signed: Robet Leu, PA-C 05/22/2023, 2:30 PM   Please refer to Dr. Milford Cage attestation of this note for  management and plan.      Patient ID: Jillian Montgomery, female   DOB: January 22, 1956, 67 y.o.   MRN: [id number]

## 2023-05-23 NOTE — Anesthesia Preprocedure Evaluation (Addendum)
Anesthesia Evaluation  Patient identified by MRN, date of birth, ID band Patient awake    Reviewed: Allergy & Precautions, NPO status , Patient's Chart, lab work & pertinent test results  Airway Mallampati: II  TM Distance: >3 FB Neck ROM: Full    Dental no notable dental hx. (+) Teeth Intact, Dental Advisory Given   Pulmonary neg pulmonary ROS   Pulmonary exam normal breath sounds clear to auscultation       Cardiovascular hypertension, Pt. on medications and Pt. on home beta blockers (-) angina (-) Past MI Normal cardiovascular exam Rhythm:Regular Rate:Normal  Echo 04/09/2023 Left Ventricle: Left ventricle size is normal.    Left Ventricle: Systolic function is normal. EF: 55-60%.    Right Ventricle: Right ventricle size is normal.    Right Ventricle: Systolic function is normal.    Patient has sinus bradycardia.     Neuro/Psych negative neurological ROS  negative psych ROS   GI/Hepatic negative GI ROS, Neg liver ROS,,,  Endo/Other  diabetes, Type 2, Insulin Dependent    Renal/GU Renal InsufficiencyRenal diseaseLab Results      Component                Value               Date                            K                        4.0                 05/21/2023                   CREATININE               1.35 (H)            05/21/2023                   Musculoskeletal   Abdominal   Peds  Hematology negative hematology ROS (+) Lab Results      Component                Value               Date                      WBC                      7.5                 05/21/2023                HGB                      10.7 (L)            05/21/2023                HCT                      35.0 (L)            05/21/2023                MCV  85.0                05/21/2023                PLT                      370                 05/21/2023              Anesthesia Other Findings All: Lisinopril,  Metformin  Reproductive/Obstetrics                             Anesthesia Physical Anesthesia Plan  ASA: 3  Anesthesia Plan: General   Post-op Pain Management:    Induction: Intravenous  PONV Risk Score and Plan: 4 or greater and Midazolam, Treatment may vary due to age or medical condition, Ondansetron and Dexamethasone  Airway Management Planned: Oral ETT  Additional Equipment: None  Intra-op Plan:   Post-operative Plan: Extubation in OR  Informed Consent: I have reviewed the patients History and Physical, chart, labs and discussed the procedure including the risks, benefits and alternatives for the proposed anesthesia with the patient or authorized representative who has indicated his/her understanding and acceptance.     Dental advisory given  Plan Discussed with: CRNA  Anesthesia Plan Comments: (See PAT note 05/21/2023)       Anesthesia Quick Evaluation

## 2023-05-23 NOTE — Progress Notes (Signed)
Anesthesia Chart Review   Case: [id number] Date/Time: 05/31/23 1215   Procedure: XI ROBOT ASSISTED LAPAROSCOPIC PARTIAL COLECTOMY   Anesthesia type: General   Pre-op diagnosis: DIVERTICULAR DISEASE COLOCUTANEOUS FISTULA   Location: [facility] ROOM 02 / [facility]   Surgeons: Romie Levee, MD       DISCUSSION:67 y.o. never smoker with h/o HTN, DM II, CKD Stage III, diverticular disease, colocutaneous fistula scheduled for above procedure 05/31/2023 with Dr. Romie Levee.   Pt seen by cardiology 04/05/2023 for preoperative evaluation.  Per OV note, "She does have abnormal EKG. Risk factor of coronary artery disease include sedentary lifestyle, hypertension, type 2 diabetes. Given T wave inversion in precordial leads, will plan for stress test prior to clearing her for surgery. She does have poor functional status, has not been walking much in last 3 months because of limitation from lower abdomen pain and discomfort. Does not endorse chest pain at rest, she is not in heart failure. No known arrhythmia, no prior coronary events. She does not recall having issue with kidney failure or prior CVA. In case of need for emergent surgery, we will bypass stress testing. Looks like, she has been having issue with recurrent diverticulitis and abscess, managed conservatively since June 2024. Her blood pressure is slightly elevated today. She thinks this is secondary to whitecoat hypertension. I have recommended her to keep a log of blood pressure at home. I will not change medication only based upon today's number. I will also request echocardiogram for structural cardiac assessment."  Echo 04/09/2023 Left Ventricle: Left ventricle size is normal.    Left Ventricle: Systolic function is normal. EF: 55-60%.    Right Ventricle: Right ventricle size is normal.    Right Ventricle: Systolic function is normal.    Patient has sinus bradycardia.   Stress Test 04/11/2023 CONCLUSIONS:  This study is negative for myocardial  ischemia with normal LVEF.  VS: BP 134/62   Pulse (!) 50   Temp (!) 36.4 C (Oral)   Ht 5\' 2"  (1.575 m)   Wt 64.9 kg   SpO2 100%   BMI 26.16 kg/m   PROVIDERS: Merri Brunette, MD is PCP    LABS: Labs reviewed: Acceptable for surgery. (all labs ordered are listed, but only abnormal results are displayed)  Labs Reviewed  BASIC METABOLIC PANEL - Abnormal; Notable for the following components:      Result Value   Sodium 131 (*)    CO2 19 (*)    Glucose, Bld 125 (*)    BUN 44 (*)    Creatinine, Ser 1.35 (*)    GFR, Estimated 43 (*)    All other components within normal limits  CBC - Abnormal; Notable for the following components:   Hemoglobin 10.7 (*)    HCT 35.0 (*)    All other components within normal limits  HEMOGLOBIN A1C - Abnormal; Notable for the following components:   Hgb A1c MFr Bld 7.6 (*)    All other components within normal limits  GLUCOSE, CAPILLARY - Abnormal; Notable for the following components:   Glucose-Capillary 124 (*)    All other components within normal limits  TYPE AND SCREEN     IMAGES:   EKG:   CV:  Past Medical History:  Diagnosis Date   Chronic kidney disease, stage III (moderate) (HCC)    Liver disease    Other and unspecified hyperlipidemia    Pancreatitis    Type II or unspecified type diabetes mellitus without mention of complication, not  stated as uncontrolled    Unspecified essential hypertension     Past Surgical History:  Procedure Laterality Date   IR CATHETER TUBE CHANGE  01/03/2023   IR CATHETER TUBE CHANGE  04/04/2023   IR PATIENT EVAL TECH 0-60 MINS  05/04/2023   IR RADIOLOGIST EVAL & MGMT  01/19/2023   PARTIAL HYSTERECTOMY      MEDICATIONS:  atenolol-chlorthalidone (TENORETIC) 50-25 MG tablet   BD PEN NEEDLE NANO 2ND GEN 32G X 4 MM MISC   Continuous Glucose Receiver (DEXCOM G7 RECEIVER) DEVI   insulin detemir (LEVEMIR) 100 UNIT/ML injection   losartan (COZAAR) 100 MG tablet   ns flush (NS FLUSH) 0.9% SOLN    oxyCODONE-acetaminophen (PERCOCET/ROXICET) 5-325 MG tablet   polyethylene glycol powder (GLYCOLAX/MIRALAX) 17 GM/SCOOP powder   TRESIBA FLEXTOUCH 100 UNIT/ML FlexTouch Pen   No current facility-administered medications for this encounter.    Jodell Cipro Ward, PA-C [facility] [phone]

## 2023-05-24 ENCOUNTER — Ambulatory Visit: Payer: Medicare HMO | Admitting: Cardiology

## 2023-05-31 ENCOUNTER — Other Ambulatory Visit: Payer: Self-pay

## 2023-05-31 ENCOUNTER — Inpatient Hospital Stay (HOSPITAL_COMMUNITY): Payer: Medicare HMO | Admitting: Physician Assistant

## 2023-05-31 ENCOUNTER — Encounter (HOSPITAL_COMMUNITY)
Admission: RE | Disposition: A | Discharge status: Home Health | Payer: Self-pay | Source: Ambulatory Visit | Attending: General Surgery

## 2023-05-31 ENCOUNTER — Inpatient Hospital Stay (HOSPITAL_COMMUNITY): Payer: Self-pay | Admitting: Anesthesiology

## 2023-05-31 ENCOUNTER — Encounter (HOSPITAL_COMMUNITY): Payer: Self-pay | Admitting: General Surgery

## 2023-05-31 ENCOUNTER — Inpatient Hospital Stay (HOSPITAL_COMMUNITY)
Admission: RE | Admit: 2023-05-31 | Discharge: 2023-06-05 | DRG: 329 | Disposition: A | Discharge status: Home Health | Payer: Medicare HMO | Source: Ambulatory Visit | Attending: General Surgery | Admitting: General Surgery

## 2023-05-31 DIAGNOSIS — K5720 Diverticulitis of large intestine with perforation and abscess without bleeding: Secondary | ICD-10-CM | POA: Diagnosis present

## 2023-05-31 DIAGNOSIS — E1122 Type 2 diabetes mellitus with diabetic chronic kidney disease: Secondary | ICD-10-CM | POA: Diagnosis present

## 2023-05-31 DIAGNOSIS — N1831 Chronic kidney disease, stage 3a: Secondary | ICD-10-CM | POA: Diagnosis present

## 2023-05-31 DIAGNOSIS — K651 Peritoneal abscess: Secondary | ICD-10-CM | POA: Diagnosis present

## 2023-05-31 DIAGNOSIS — Z6825 Body mass index (BMI) 25.0-25.9, adult: Secondary | ICD-10-CM

## 2023-05-31 DIAGNOSIS — E1140 Type 2 diabetes mellitus with diabetic neuropathy, unspecified: Secondary | ICD-10-CM | POA: Diagnosis present

## 2023-05-31 DIAGNOSIS — E440 Moderate protein-calorie malnutrition: Secondary | ICD-10-CM | POA: Insufficient documentation

## 2023-05-31 DIAGNOSIS — Z803 Family history of malignant neoplasm of breast: Secondary | ICD-10-CM

## 2023-05-31 DIAGNOSIS — D509 Iron deficiency anemia, unspecified: Secondary | ICD-10-CM | POA: Diagnosis present

## 2023-05-31 DIAGNOSIS — Z888 Allergy status to other drugs, medicaments and biological substances status: Secondary | ICD-10-CM

## 2023-05-31 DIAGNOSIS — D62 Acute posthemorrhagic anemia: Secondary | ICD-10-CM | POA: Diagnosis not present

## 2023-05-31 DIAGNOSIS — K567 Ileus, unspecified: Secondary | ICD-10-CM | POA: Diagnosis not present

## 2023-05-31 DIAGNOSIS — Z90711 Acquired absence of uterus with remaining cervical stump: Secondary | ICD-10-CM

## 2023-05-31 DIAGNOSIS — D631 Anemia in chronic kidney disease: Secondary | ICD-10-CM | POA: Diagnosis present

## 2023-05-31 DIAGNOSIS — Z79899 Other long term (current) drug therapy: Secondary | ICD-10-CM | POA: Diagnosis not present

## 2023-05-31 DIAGNOSIS — N183 Chronic kidney disease, stage 3 unspecified: Secondary | ICD-10-CM | POA: Diagnosis not present

## 2023-05-31 DIAGNOSIS — N736 Female pelvic peritoneal adhesions (postinfective): Secondary | ICD-10-CM | POA: Diagnosis present

## 2023-05-31 DIAGNOSIS — Z833 Family history of diabetes mellitus: Secondary | ICD-10-CM

## 2023-05-31 DIAGNOSIS — I959 Hypotension, unspecified: Secondary | ICD-10-CM | POA: Diagnosis not present

## 2023-05-31 DIAGNOSIS — L929 Granulomatous disorder of the skin and subcutaneous tissue, unspecified: Secondary | ICD-10-CM | POA: Diagnosis not present

## 2023-05-31 DIAGNOSIS — E871 Hypo-osmolality and hyponatremia: Secondary | ICD-10-CM | POA: Diagnosis not present

## 2023-05-31 DIAGNOSIS — Z8249 Family history of ischemic heart disease and other diseases of the circulatory system: Secondary | ICD-10-CM

## 2023-05-31 DIAGNOSIS — H409 Unspecified glaucoma: Secondary | ICD-10-CM | POA: Diagnosis present

## 2023-05-31 DIAGNOSIS — K529 Noninfective gastroenteritis and colitis, unspecified: Secondary | ICD-10-CM | POA: Diagnosis not present

## 2023-05-31 DIAGNOSIS — I10 Essential (primary) hypertension: Secondary | ICD-10-CM | POA: Diagnosis present

## 2023-05-31 DIAGNOSIS — E785 Hyperlipidemia, unspecified: Secondary | ICD-10-CM | POA: Diagnosis present

## 2023-05-31 DIAGNOSIS — E1165 Type 2 diabetes mellitus with hyperglycemia: Secondary | ICD-10-CM | POA: Diagnosis present

## 2023-05-31 DIAGNOSIS — Z794 Long term (current) use of insulin: Secondary | ICD-10-CM

## 2023-05-31 DIAGNOSIS — K632 Fistula of intestine: Secondary | ICD-10-CM | POA: Diagnosis not present

## 2023-05-31 DIAGNOSIS — K5730 Diverticulosis of large intestine without perforation or abscess without bleeding: Secondary | ICD-10-CM | POA: Diagnosis not present

## 2023-05-31 DIAGNOSIS — K5790 Diverticulosis of intestine, part unspecified, without perforation or abscess without bleeding: Secondary | ICD-10-CM | POA: Diagnosis not present

## 2023-05-31 DIAGNOSIS — E119 Type 2 diabetes mellitus without complications: Secondary | ICD-10-CM

## 2023-05-31 DIAGNOSIS — I129 Hypertensive chronic kidney disease with stage 1 through stage 4 chronic kidney disease, or unspecified chronic kidney disease: Secondary | ICD-10-CM | POA: Diagnosis present

## 2023-05-31 DIAGNOSIS — Z800 Family history of malignant neoplasm of digestive organs: Secondary | ICD-10-CM

## 2023-05-31 DIAGNOSIS — N179 Acute kidney failure, unspecified: Secondary | ICD-10-CM | POA: Diagnosis present

## 2023-05-31 LAB — GLUCOSE, CAPILLARY
Glucose-Capillary: 119 mg/dL — ABNORMAL HIGH (ref 70–99)
Glucose-Capillary: 197 mg/dL — ABNORMAL HIGH (ref 70–99)
Glucose-Capillary: 235 mg/dL — ABNORMAL HIGH (ref 70–99)
Glucose-Capillary: 244 mg/dL — ABNORMAL HIGH (ref 70–99)

## 2023-05-31 LAB — TYPE AND SCREEN
ABO/RH(D): O POS
Antibody Screen: NEGATIVE

## 2023-05-31 LAB — ABO/RH: ABO/RH(D): O POS

## 2023-05-31 SURGERY — COLECTOMY, PARTIAL, ROBOT-ASSISTED, LAPAROSCOPIC
Anesthesia: General

## 2023-05-31 MED ORDER — DEXAMETHASONE SODIUM PHOSPHATE 4 MG/ML IJ SOLN
INTRAMUSCULAR | Status: DC | PRN
  Administered 2023-05-31: 10 mg via INTRAVENOUS

## 2023-05-31 MED ORDER — MIDAZOLAM HCL 2 MG/2ML IJ SOLN
INTRAMUSCULAR | Status: AC
  Filled 2023-05-31: qty 2

## 2023-05-31 MED ORDER — PHENYLEPHRINE HCL-NACL 20-0.9 MG/250ML-% IV SOLN
INTRAVENOUS | Status: DC | PRN
  Administered 2023-05-31: 50 ug/min via INTRAVENOUS

## 2023-05-31 MED ORDER — PROPOFOL 10 MG/ML IV BOLUS
INTRAVENOUS | Status: DC | PRN
  Administered 2023-05-31: 140 mg via INTRAVENOUS

## 2023-05-31 MED ORDER — SACCHAROMYCES BOULARDII 250 MG PO CAPS
250.0000 mg | ORAL_CAPSULE | Freq: Two times a day (BID) | ORAL | Status: DC
  Administered 2023-05-31 – 2023-06-01 (×3): 250 mg via ORAL
  Filled 2023-05-31 (×3): qty 1

## 2023-05-31 MED ORDER — INDOCYANINE GREEN 25 MG IV SOLR
INTRAVENOUS | Status: DC | PRN
  Administered 2023-05-31: 2.5 mg via INTRAVENOUS

## 2023-05-31 MED ORDER — LIDOCAINE HCL (CARDIAC) PF 100 MG/5ML IV SOSY
PREFILLED_SYRINGE | INTRAVENOUS | Status: DC | PRN
  Administered 2023-05-31: 80 mg via INTRAVENOUS

## 2023-05-31 MED ORDER — BUPIVACAINE LIPOSOME 1.3 % IJ SUSP
INTRAMUSCULAR | Status: AC
  Filled 2023-05-31: qty 20

## 2023-05-31 MED ORDER — FENTANYL CITRATE (PF) 100 MCG/2ML IJ SOLN
INTRAMUSCULAR | Status: DC | PRN
  Administered 2023-05-31 (×2): 50 ug via INTRAVENOUS

## 2023-05-31 MED ORDER — SODIUM CHLORIDE 0.9 % IV SOLN
2.0000 g | Freq: Two times a day (BID) | INTRAVENOUS | Status: AC
  Administered 2023-06-01: 2 g via INTRAVENOUS
  Filled 2023-05-31: qty 2

## 2023-05-31 MED ORDER — ACETAMINOPHEN 10 MG/ML IV SOLN: 1000.0000 mg | Freq: Once | INTRAVENOUS | Status: DC | PRN

## 2023-05-31 MED ORDER — HYDROMORPHONE HCL 1 MG/ML IJ SOLN
0.5000 mg | INTRAMUSCULAR | Status: DC | PRN
  Administered 2023-05-31: 0.5 mg via INTRAVENOUS
  Filled 2023-05-31: qty 0.5

## 2023-05-31 MED ORDER — ORAL CARE MOUTH RINSE: 15.0000 mL | Freq: Once | OROMUCOSAL | Status: AC

## 2023-05-31 MED ORDER — ROCURONIUM BROMIDE 100 MG/10ML IV SOLN
INTRAVENOUS | Status: DC | PRN
  Administered 2023-05-31: 60 mg via INTRAVENOUS
  Administered 2023-05-31: 20 mg via INTRAVENOUS

## 2023-05-31 MED ORDER — KCL IN DEXTROSE-NACL 20-5-0.45 MEQ/L-%-% IV SOLN
INTRAVENOUS | Status: DC
  Filled 2023-05-31: qty 1000

## 2023-05-31 MED ORDER — LIDOCAINE HCL (PF) 2 % IJ SOLN
INTRAMUSCULAR | Status: DC | PRN
  Administered 2023-05-31: 1.5 mg/kg/h via INTRADERMAL

## 2023-05-31 MED ORDER — LIDOCAINE HCL (PF) 2 % IJ SOLN
INTRAMUSCULAR | Status: AC
  Filled 2023-05-31: qty 5

## 2023-05-31 MED ORDER — INSULIN ASPART 100 UNIT/ML IJ SOLN
0.0000 [IU] | Freq: Every day | INTRAMUSCULAR | Status: DC
  Administered 2023-05-31: 2 [IU] via SUBCUTANEOUS

## 2023-05-31 MED ORDER — INSULIN ASPART 100 UNIT/ML IJ SOLN
0.0000 [IU] | Freq: Three times a day (TID) | INTRAMUSCULAR | Status: DC
  Administered 2023-05-31 – 2023-06-01 (×3): 7 [IU] via SUBCUTANEOUS

## 2023-05-31 MED ORDER — EPHEDRINE 5 MG/ML INJ
INTRAVENOUS | Status: AC
  Filled 2023-05-31: qty 5

## 2023-05-31 MED ORDER — LACTATED RINGERS IR SOLN
Status: DC | PRN
  Administered 2023-05-31: 1000 mL

## 2023-05-31 MED ORDER — ONDANSETRON HCL 4 MG/2ML IJ SOLN
INTRAMUSCULAR | Status: AC
  Filled 2023-05-31: qty 2

## 2023-05-31 MED ORDER — ATENOLOL-CHLORTHALIDONE 50-25 MG PO TABS: 1.0000 | ORAL_TABLET | Freq: Every day | ORAL | Status: DC

## 2023-05-31 MED ORDER — VASOPRESSIN 20 UNIT/ML IV SOLN
INTRAVENOUS | Status: DC | PRN
  Administered 2023-05-31 (×2): 1 [IU] via INTRAVENOUS

## 2023-05-31 MED ORDER — BISACODYL 5 MG PO TBEC: 20.0000 mg | DELAYED_RELEASE_TABLET | Freq: Once | ORAL | Status: DC

## 2023-05-31 MED ORDER — ALUM & MAG HYDROXIDE-SIMETH 200-200-20 MG/5ML PO SUSP: 30.0000 mL | Freq: Four times a day (QID) | ORAL | Status: DC | PRN

## 2023-05-31 MED ORDER — HEPARIN SODIUM (PORCINE) 5000 UNIT/ML IJ SOLN
5000.0000 [IU] | Freq: Once | INTRAMUSCULAR | Status: AC
  Administered 2023-05-31: 5000 [IU] via SUBCUTANEOUS
  Filled 2023-05-31: qty 1

## 2023-05-31 MED ORDER — CHLORTHALIDONE 25 MG PO TABS
25.0000 mg | ORAL_TABLET | Freq: Every day | ORAL | Status: DC
  Administered 2023-06-01: 25 mg via ORAL
  Filled 2023-05-31: qty 1

## 2023-05-31 MED ORDER — BUPIVACAINE-EPINEPHRINE 0.25% -1:200000 IJ SOLN
INTRAMUSCULAR | Status: AC
Start: 2023-05-31 — End: ?
  Filled 2023-05-31: qty 1

## 2023-05-31 MED ORDER — EPHEDRINE SULFATE (PRESSORS) 50 MG/ML IJ SOLN
INTRAMUSCULAR | Status: DC | PRN
  Administered 2023-05-31: 10 mg via INTRAVENOUS
  Administered 2023-05-31: 5 mg via INTRAVENOUS
  Administered 2023-05-31: 10 mg via INTRAVENOUS

## 2023-05-31 MED ORDER — KETAMINE HCL 50 MG/5ML IJ SOSY
PREFILLED_SYRINGE | INTRAMUSCULAR | Status: AC
Start: 2023-05-31 — End: ?
  Filled 2023-05-31: qty 5

## 2023-05-31 MED ORDER — ONDANSETRON HCL 4 MG/2ML IJ SOLN
INTRAMUSCULAR | Status: DC | PRN
  Administered 2023-05-31: 4 mg via INTRAVENOUS

## 2023-05-31 MED ORDER — HYDROMORPHONE HCL 1 MG/ML IJ SOLN
INTRAMUSCULAR | Status: DC | PRN
  Administered 2023-05-31 (×2): 1 mg via INTRAVENOUS

## 2023-05-31 MED ORDER — CHLORHEXIDINE GLUCONATE 0.12 % MT SOLN
15.0000 mL | Freq: Once | OROMUCOSAL | Status: AC
  Administered 2023-05-31: 15 mL via OROMUCOSAL

## 2023-05-31 MED ORDER — ALVIMOPAN 12 MG PO CAPS
12.0000 mg | ORAL_CAPSULE | ORAL | Status: AC
  Administered 2023-05-31: 12 mg via ORAL
  Filled 2023-05-31: qty 1

## 2023-05-31 MED ORDER — PROPOFOL 10 MG/ML IV BOLUS
INTRAVENOUS | Status: AC
  Filled 2023-05-31: qty 20

## 2023-05-31 MED ORDER — ENSURE PRE-SURGERY PO LIQD
296.0000 mL | Freq: Once | ORAL | Status: DC
  Filled 2023-05-31: qty 296

## 2023-05-31 MED ORDER — 0.9 % SODIUM CHLORIDE (POUR BTL) OPTIME
TOPICAL | Status: DC | PRN
  Administered 2023-05-31: 1000 mL

## 2023-05-31 MED ORDER — ENOXAPARIN SODIUM 40 MG/0.4ML IJ SOSY
40.0000 mg | PREFILLED_SYRINGE | INTRAMUSCULAR | Status: DC
  Administered 2023-06-01: 40 mg via SUBCUTANEOUS
  Filled 2023-05-31: qty 0.4

## 2023-05-31 MED ORDER — HYDROMORPHONE HCL 1 MG/ML IJ SOLN
INTRAMUSCULAR | Status: AC
  Filled 2023-05-31: qty 1

## 2023-05-31 MED ORDER — LACTATED RINGERS IV SOLN: INTRAVENOUS | Status: DC

## 2023-05-31 MED ORDER — ONDANSETRON HCL 4 MG PO TABS: 4.0000 mg | ORAL_TABLET | Freq: Four times a day (QID) | ORAL | Status: DC | PRN

## 2023-05-31 MED ORDER — ALVIMOPAN 12 MG PO CAPS
12.0000 mg | ORAL_CAPSULE | Freq: Two times a day (BID) | ORAL | Status: DC
  Administered 2023-06-01 – 2023-06-02 (×2): 12 mg via ORAL
  Filled 2023-05-31 (×4): qty 1

## 2023-05-31 MED ORDER — BUPIVACAINE LIPOSOME 1.3 % IJ SUSP: 20.0000 mL | Freq: Once | INTRAMUSCULAR | Status: DC

## 2023-05-31 MED ORDER — INSULIN ASPART 100 UNIT/ML IJ SOLN: 0.0000 [IU] | INTRAMUSCULAR | Status: DC | PRN

## 2023-05-31 MED ORDER — AMISULPRIDE (ANTIEMETIC) 5 MG/2ML IV SOLN: 10.0000 mg | Freq: Once | INTRAVENOUS | Status: DC | PRN

## 2023-05-31 MED ORDER — GABAPENTIN 300 MG PO CAPS
300.0000 mg | ORAL_CAPSULE | ORAL | Status: AC
  Administered 2023-05-31: 300 mg via ORAL
  Filled 2023-05-31: qty 1

## 2023-05-31 MED ORDER — DIPHENHYDRAMINE HCL 50 MG/ML IJ SOLN: 12.5000 mg | Freq: Four times a day (QID) | INTRAMUSCULAR | Status: DC | PRN

## 2023-05-31 MED ORDER — LOSARTAN POTASSIUM 50 MG PO TABS
100.0000 mg | ORAL_TABLET | Freq: Every day | ORAL | Status: DC
  Administered 2023-06-01: 100 mg via ORAL
  Filled 2023-05-31: qty 2

## 2023-05-31 MED ORDER — ENSURE SURGERY PO LIQD
237.0000 mL | Freq: Two times a day (BID) | ORAL | Status: DC
  Administered 2023-06-01 – 2023-06-03 (×5): 237 mL via ORAL

## 2023-05-31 MED ORDER — KETAMINE HCL 10 MG/ML IJ SOLN
INTRAMUSCULAR | Status: DC | PRN
  Administered 2023-05-31 (×2): 20 mg via INTRAVENOUS

## 2023-05-31 MED ORDER — ONDANSETRON HCL 4 MG/2ML IJ SOLN: 4.0000 mg | Freq: Once | INTRAMUSCULAR | Status: DC | PRN

## 2023-05-31 MED ORDER — GLYCOPYRROLATE 0.2 MG/ML IJ SOLN
INTRAMUSCULAR | Status: DC | PRN
  Administered 2023-05-31: .2 mg via INTRAVENOUS

## 2023-05-31 MED ORDER — ROCURONIUM BROMIDE 10 MG/ML (PF) SYRINGE
PREFILLED_SYRINGE | INTRAVENOUS | Status: AC
Start: 2023-05-31 — End: ?
  Filled 2023-05-31: qty 10

## 2023-05-31 MED ORDER — SIMETHICONE 80 MG PO CHEW
40.0000 mg | CHEWABLE_TABLET | Freq: Four times a day (QID) | ORAL | Status: DC | PRN
Start: 2023-05-31 — End: 2023-06-05

## 2023-05-31 MED ORDER — ACETAMINOPHEN 500 MG PO TABS
1000.0000 mg | ORAL_TABLET | Freq: Four times a day (QID) | ORAL | Status: DC
  Administered 2023-05-31 – 2023-06-03 (×10): 1000 mg via ORAL
  Filled 2023-05-31 (×10): qty 2

## 2023-05-31 MED ORDER — OXYCODONE HCL 5 MG PO TABS: 5.0000 mg | ORAL_TABLET | Freq: Once | ORAL | Status: DC | PRN

## 2023-05-31 MED ORDER — POLYETHYLENE GLYCOL 3350 17 GM/SCOOP PO POWD
1.0000 | Freq: Once | ORAL | Status: DC
  Filled 2023-05-31: qty 255

## 2023-05-31 MED ORDER — DEXAMETHASONE SODIUM PHOSPHATE 10 MG/ML IJ SOLN
INTRAMUSCULAR | Status: AC
Start: 2023-05-31 — End: ?
  Filled 2023-05-31: qty 1

## 2023-05-31 MED ORDER — LACTATED RINGERS IV SOLN: INTRAVENOUS | Status: DC | PRN

## 2023-05-31 MED ORDER — MIDAZOLAM HCL 5 MG/5ML IJ SOLN
INTRAMUSCULAR | Status: DC | PRN
  Administered 2023-05-31: 2 mg via INTRAVENOUS

## 2023-05-31 MED ORDER — HYDROMORPHONE HCL 1 MG/ML IJ SOLN
0.2500 mg | INTRAMUSCULAR | Status: DC | PRN
Start: 2023-05-31 — End: 2023-05-31
  Administered 2023-05-31: 0.25 mg via INTRAVENOUS

## 2023-05-31 MED ORDER — DIPHENHYDRAMINE HCL 12.5 MG/5ML PO ELIX
12.5000 mg | ORAL_SOLUTION | Freq: Four times a day (QID) | ORAL | Status: DC | PRN
Start: 2023-05-31 — End: 2023-06-05

## 2023-05-31 MED ORDER — ACETAMINOPHEN 500 MG PO TABS
1000.0000 mg | ORAL_TABLET | ORAL | Status: AC
  Administered 2023-05-31: 1000 mg via ORAL
  Filled 2023-05-31: qty 2

## 2023-05-31 MED ORDER — VASOPRESSIN 20 UNIT/ML IV SOLN
INTRAVENOUS | Status: AC
  Filled 2023-05-31: qty 1

## 2023-05-31 MED ORDER — ENSURE PRE-SURGERY PO LIQD
592.0000 mL | Freq: Once | ORAL | Status: DC
  Filled 2023-05-31: qty 592

## 2023-05-31 MED ORDER — GABAPENTIN 100 MG PO CAPS
300.0000 mg | ORAL_CAPSULE | Freq: Two times a day (BID) | ORAL | Status: DC
  Administered 2023-05-31 – 2023-06-01 (×3): 300 mg via ORAL
  Filled 2023-05-31 (×3): qty 3

## 2023-05-31 MED ORDER — SUGAMMADEX SODIUM 200 MG/2ML IV SOLN
INTRAVENOUS | Status: DC | PRN
  Administered 2023-05-31: 200 mg via INTRAVENOUS

## 2023-05-31 MED ORDER — FENTANYL CITRATE (PF) 100 MCG/2ML IJ SOLN
INTRAMUSCULAR | Status: AC
  Filled 2023-05-31: qty 2

## 2023-05-31 MED ORDER — GLYCOPYRROLATE 0.2 MG/ML IJ SOLN
INTRAMUSCULAR | Status: AC
Start: 2023-05-31 — End: ?
  Filled 2023-05-31: qty 1

## 2023-05-31 MED ORDER — ATENOLOL 25 MG PO TABS
50.0000 mg | ORAL_TABLET | Freq: Every day | ORAL | Status: DC
  Administered 2023-06-01: 50 mg via ORAL
  Filled 2023-05-31: qty 2

## 2023-05-31 MED ORDER — OXYCODONE HCL 5 MG/5ML PO SOLN
5.0000 mg | Freq: Once | ORAL | Status: DC | PRN
Start: 2023-05-31 — End: 2023-05-31

## 2023-05-31 MED ORDER — HYDROMORPHONE HCL 2 MG/ML IJ SOLN
INTRAMUSCULAR | Status: AC
  Filled 2023-05-31: qty 1

## 2023-05-31 MED ORDER — SODIUM CHLORIDE 0.9 % IV SOLN
2.0000 g | INTRAVENOUS | Status: AC
  Administered 2023-05-31: 2 g via INTRAVENOUS
  Filled 2023-05-31: qty 2

## 2023-05-31 MED ORDER — BUPIVACAINE-EPINEPHRINE (PF) 0.25% -1:200000 IJ SOLN
INTRAMUSCULAR | Status: DC | PRN
  Administered 2023-05-31: 50 mL

## 2023-05-31 MED ORDER — ONDANSETRON HCL 4 MG/2ML IJ SOLN
4.0000 mg | Freq: Four times a day (QID) | INTRAMUSCULAR | Status: DC | PRN
  Administered 2023-06-03: 4 mg via INTRAVENOUS
  Filled 2023-05-31: qty 2

## 2023-05-31 SURGICAL SUPPLY — 83 items
BAG COUNTER SPONGE SURGICOUNT (BAG) ×1 IMPLANT
BLADE EXTENDED COATED 6.5IN (ELECTRODE) IMPLANT
CANNULA REDUCER 12-8 DVNC XI (CANNULA) IMPLANT
COVER SURGICAL LIGHT HANDLE (MISCELLANEOUS) ×2 IMPLANT
COVER TIP SHEARS 8 DVNC (MISCELLANEOUS) ×1 IMPLANT
DERMABOND ADVANCED .7 DNX12 (GAUZE/BANDAGES/DRESSINGS) IMPLANT
DRAIN CHANNEL 19F RND (DRAIN) IMPLANT
DRAPE ARM DVNC X/XI (DISPOSABLE) ×4 IMPLANT
DRAPE COLUMN DVNC XI (DISPOSABLE) ×1 IMPLANT
DRAPE SURG IRRIG POUCH 19X23 (DRAPES) ×1 IMPLANT
DRIVER NDL LRG 8 DVNC XI (INSTRUMENTS) ×1 IMPLANT
DRIVER NDLE LRG 8 DVNC XI (INSTRUMENTS)
DRSG OPSITE POSTOP 4X10 (GAUZE/BANDAGES/DRESSINGS) IMPLANT
DRSG OPSITE POSTOP 4X6 (GAUZE/BANDAGES/DRESSINGS) IMPLANT
DRSG OPSITE POSTOP 4X8 (GAUZE/BANDAGES/DRESSINGS) IMPLANT
ELECT PENCIL ROCKER SW 15FT (MISCELLANEOUS) ×1 IMPLANT
ELECT REM PT RETURN 15FT ADLT (MISCELLANEOUS) ×1 IMPLANT
ENDOLOOP SUT PDS II 0 18 (SUTURE) IMPLANT
EVACUATOR SILICONE 100CC (DRAIN) IMPLANT
FORCEPS BPLR FENES DVNC XI (FORCEP) IMPLANT
GLOVE BIO SURGEON STRL SZ 6.5 (GLOVE) ×3 IMPLANT
GLOVE INDICATOR 6.5 STRL GRN (GLOVE) ×3 IMPLANT
GOWN SRG XL LVL 4 BRTHBL STRL (GOWNS) ×1 IMPLANT
GOWN STRL NON-REIN XL LVL4 (GOWNS) ×1
GOWN STRL REUS W/ TWL XL LVL3 (GOWN DISPOSABLE) ×3 IMPLANT
GOWN STRL REUS W/TWL XL LVL3 (GOWN DISPOSABLE) ×3
GRASPER SUT TROCAR 14GX15 (MISCELLANEOUS) IMPLANT
GRASPER TIP-UP FEN DVNC XI (INSTRUMENTS) ×1 IMPLANT
HOLDER FOLEY CATH W/STRAP (MISCELLANEOUS) ×1 IMPLANT
IRRIG SUCT STRYKERFLOW 2 WTIP (MISCELLANEOUS) ×1
IRRIGATION SUCT STRKRFLW 2 WTP (MISCELLANEOUS) ×1 IMPLANT
KIT PROCEDURE DVNC SI (MISCELLANEOUS) IMPLANT
KIT TURNOVER KIT A (KITS) IMPLANT
NDL INSUFFLATION 14GA 120MM (NEEDLE) ×1 IMPLANT
NEEDLE INSUFFLATION 14GA 120MM (NEEDLE) ×1
PACK CARDIOVASCULAR III (CUSTOM PROCEDURE TRAY) ×1 IMPLANT
PACK COLON (CUSTOM PROCEDURE TRAY) ×1 IMPLANT
PAD POSITIONING PINK XL (MISCELLANEOUS) ×1 IMPLANT
RELOAD STAPLE 60 3.5 BLU DVNC (STAPLE) IMPLANT
RELOAD STAPLE 60 4.3 GRN DVNC (STAPLE) IMPLANT
RETRACTOR WND ALEXIS 18 MED (MISCELLANEOUS) IMPLANT
RTRCTR WOUND ALEXIS 18CM MED (MISCELLANEOUS)
SCISSORS LAP 5X35 DISP (ENDOMECHANICALS) IMPLANT
SCISSORS MNPLR CVD DVNC XI (INSTRUMENTS) ×1 IMPLANT
SEAL UNIV 5-12 XI (MISCELLANEOUS) ×3 IMPLANT
SEALER VESSEL EXT DVNC XI (MISCELLANEOUS) ×1 IMPLANT
SOL ELECTROSURG ANTI STICK (MISCELLANEOUS) ×1
SOLUTION ELECTROSURG ANTI STCK (MISCELLANEOUS) ×1 IMPLANT
SPIKE FLUID TRANSFER (MISCELLANEOUS) IMPLANT
STAPLER 60 SUREFORM DVNC (STAPLE) IMPLANT
STAPLER ECHELON POWER CIR 29 (STAPLE) IMPLANT
STAPLER ECHELON POWER CIR 31 (STAPLE) IMPLANT
STAPLER RELOAD 3.5X60 BLU DVNC (STAPLE)
STAPLER RELOAD 4.3X60 GRN DVNC (STAPLE) ×2
STOPCOCK 4 WAY LG BORE MALE ST (IV SETS) ×2 IMPLANT
SUT ETHILON 2 0 PS N (SUTURE) IMPLANT
SUT NOVA NAB GS-21 1 T12 (SUTURE) ×2 IMPLANT
SUT PROLENE 2 0 KS (SUTURE) IMPLANT
SUT SILK 2 0 (SUTURE) ×1
SUT SILK 2 0 SH CR/8 (SUTURE) IMPLANT
SUT SILK 2-0 18XBRD TIE 12 (SUTURE) ×1 IMPLANT
SUT SILK 3 0 (SUTURE)
SUT SILK 3 0 SH CR/8 (SUTURE) ×1 IMPLANT
SUT SILK 3-0 18XBRD TIE 12 (SUTURE) IMPLANT
SUT V-LOC BARB 180 2/0GR6 GS22 (SUTURE)
SUT VIC AB 2-0 SH 18 (SUTURE) IMPLANT
SUT VIC AB 2-0 SH 27 (SUTURE) ×1
SUT VIC AB 2-0 SH 27X BRD (SUTURE) IMPLANT
SUT VIC AB 3-0 SH 18 (SUTURE) IMPLANT
SUT VIC AB 4-0 PS2 27 (SUTURE) ×2 IMPLANT
SUT VICRYL 0 UR6 27IN ABS (SUTURE) ×1 IMPLANT
SUTURE V-LC BRB 180 2/0GR6GS22 (SUTURE) IMPLANT
SYR 20ML ECCENTRIC (SYRINGE) ×1 IMPLANT
SYS LAPSCP GELPORT 120MM (MISCELLANEOUS)
SYS WOUND ALEXIS 18CM MED (MISCELLANEOUS) ×1
SYSTEM LAPSCP GELPORT 120MM (MISCELLANEOUS) IMPLANT
SYSTEM WOUND ALEXIS 18CM MED (MISCELLANEOUS) IMPLANT
TOWEL OR 17X26 10 PK STRL BLUE (TOWEL DISPOSABLE) IMPLANT
TOWEL OR NON WOVEN STRL DISP B (DISPOSABLE) ×1 IMPLANT
TRAY FOLEY MTR SLVR 16FR STAT (SET/KITS/TRAYS/PACK) ×1 IMPLANT
TROCAR ADV FIXATION 5X100MM (TROCAR) ×1 IMPLANT
TUBING CONNECTING 10 (TUBING) ×2 IMPLANT
TUBING INSUFFLATION 10FT LAP (TUBING) ×1 IMPLANT

## 2023-05-31 NOTE — Plan of Care (Signed)
  Problem: Education: Goal: Understanding of discharge needs will improve Outcome: Progressing Goal: Verbalization of understanding of the causes of altered bowel function will improve Outcome: Progressing   Problem: Activity: Goal: Ability to tolerate increased activity will improve Outcome: Progressing   Problem: Clinical Measurements: Goal: Postoperative complications will be avoided or minimized Outcome: Progressing   Problem: Respiratory: Goal: Respiratory status will improve Outcome: Progressing   Problem: Education: Goal: Knowledge of General Education information will improve Description: Including pain rating scale, medication(s)/side effects and non-pharmacologic comfort measures Outcome: Progressing   Problem: Clinical Measurements: Goal: Respiratory complications will improve Outcome: Progressing Goal: Cardiovascular complication will be avoided Outcome: Progressing   Problem: Pain Management: Goal: General experience of comfort will improve Outcome: Progressing   Problem: Skin Integrity: Goal: Risk for impaired skin integrity will decrease Outcome: Progressing

## 2023-05-31 NOTE — Op Note (Signed)
05/31/2023  3:17 PM  PATIENT:  Jillian Montgomery  67 y.o. female  Patient Care Team: Merri Brunette, MD as PCP - General (Family Medicine)  PRE-OPERATIVE DIAGNOSIS:  DIVERTICULAR DISEASE COLOCUTANEOUS FISTULA  POST-OPERATIVE DIAGNOSIS:  DIVERTICULAR DISEASE COLOCUTANEOUS FISTULA  PROCEDURE:  XI ROBOT ASSISTED LAPAROSCOPIC LOW ANTERIOR RESECTION, SPLENIC FLEXURE MOBILIZATION, AND INTRAOPERATIVE ASSESSMENT OF PERFUSION USING ICG DYE    Surgeon(s): Romie Levee, MD Karie Soda, MD  ASSISTANT: Dr Michaell Cowing   ANESTHESIA:   local and general  EBL:126ml  Total I/O In: 1700 [I.V.:1600; IV Piggyback:100] Out: 30 [Blood:30]  Delay start of Pharmacological VTE agent (>24hrs) due to surgical blood loss or risk of bleeding:  no  DRAINS: none   SPECIMEN:  Source of Specimen:  Rectosigmoid  DISPOSITION OF SPECIMEN:  PATHOLOGY  COUNTS:  YES  PLAN OF CARE: Admit to inpatient   PATIENT DISPOSITION:  PACU - hemodynamically stable.  INDICATION:    67 y.o. F who presented to the office after hospitalization for perforated diverticulitis.  A drain was placed at that time.  She continued to have a persistent fistula to the drain and therefore it was left in place.  Patient was noted to have uncontrolled diabetes and underwent drug regimen change to improve this.  Once this was completed, I recommended segmental resection:  The anatomy & physiology of the digestive tract was discussed.  The pathophysiology was discussed.  Natural history risks without surgery was discussed.   I worked to give an overview of the disease and the frequent need to have multispecialty involvement.  I feel the risks of no intervention will lead to serious problems that outweigh the operative risks; therefore, I recommended a partial colectomy to remove the pathology.  Laparoscopic & open techniques were discussed.   Risks such as bleeding, infection, abscess, leak, reoperation, possible ostomy, hernia, heart attack,  death, and other risks were discussed.  I noted a good likelihood this will help address the problem.   Goals of post-operative recovery were discussed as well.    The patient expressed understanding & wished to proceed with surgery.  OR FINDINGS:   Patient had significant left pelvic sidewall adhesions with fistula to an abscess cavity within the left lower quadrant subcutaneous tissue.  The anastomosis rests 10 cm from the anal verge by rigid proctoscopy.  DESCRIPTION:   Informed consent was confirmed.  The patient underwent general anaesthesia without difficulty.  The patient was positioned appropriately.  VTE prevention in place.  The patient's abdomen was clipped, prepped, & draped in a sterile fashion.  Surgical timeout confirmed our plan.  The patient was positioned in reverse Trendelenburg.  Abdominal entry was gained using a Veress needle in the left upper quadrant.  Entry was clean.  I induced carbon dioxide insufflation.  An 8mm robotic port was placed in the right upper quadrant.  Camera inspection revealed no injury.  Extra ports were carefully placed under direct laparoscopic visualization.  I laparoscopically reflected the greater omentum and the upper abdomen the small bowel in the upper abdomen. The patient was appropriately positioned and the robot was docked to the patient's left side.  Instruments were placed under direct visualization.    I mobilized the sigmoid colon off of the pelvic sidewall.  The sigmoid colon was adherent in the upper left lower quadrant.  This was taken down using blunt dissection and electrocautery.  The fistula tract was identified and transected.  The drain was removed.  I scored the base of peritoneum of  the right side of the mesentery of the left colon from the ligament of Treitz to the peritoneal reflection of the mid rectum.  The patient had.  I elevated the sigmoid mesentery and enetered into the retro-mesenteric plane. We were able to identify the  left ureter and gonadal vessels. We kept those posterior within the retroperitoneum and elevated the left colon mesentery off that. I did isolated IMA pedicle but did not ligate it yet.  I continued distally and got into the avascular plane posterior to the mesorectum. This allowed me to help mobilize the rectum as well by freeing the mesorectum off the sacrum.  I mobilized the peritoneal coverings towards the peritoneal reflection on both the right and left sides of the rectum.  I could see the right and left ureters and stayed away from them.    I skeletonized the inferior mesenteric artery pedicle.  I went down to its takeoff from the aorta.   I isolated the inferior mesenteric vein off of the ligament of Treitz just cephalad to that as well.  After confirming the left ureter was out of the way, I went ahead and ligated the inferior mesenteric artery pedicle with bipolar robotic vessel sealer ~2cm above its takeoff from the aorta.   I did ligate the inferior mesenteric vein in a similar fashion.  We ensured hemostasis. I skeletonized the mesorectum at the junction at the proximal rectum using blunt dissection & bipolar robotic vessel sealer.  I mobilized the left colon in a lateral to medial fashion off the line of Toldt up towards the splenic flexure to ensure good mobilization of the left colon to reach into the pelvis.  The descending colon would not reach into the pelvis.  I decided to take down the splenic flexure.  I divided the mesentery from the transverse colon using the robotic vessel sealer.  I then divided the splenocolic ligament to take down the splenic flexure.  I finished by dividing the retroperitoneal attachments to allow for mobilization of the colon into the pelvis.  Once this was completed the mesentery was divided using the robotic vessel sealer up to the descending sigmoid junction.  2.5 mg of ICG dye was injected intravenously to confirm good perfusion of the remaining colon and rectum.   Once this was complete, the robot was undocked.  The suprapubic port was enlarged to a Pfannenstiel incision and an Alexis wound protector was placed.  The colon was brought out through this and divided over a pursestring device.  2-0 Prolene pursestring was placed and secured with interrupted 3-0 silk sutures.  A 29 mm EEA anvil was placed into the colon and pursestring was tied tightly around this.  This was then placed back into the abdomen.  EEA stapler was inserted into the rectum and brought out through the end of the stump.  An anastomosis was created under laparoscopic visualization.  There was no tension on the anastomosis.  There was no leak when tested with insufflation under irrigation.  The entire abdomen was irrigated.  There was no sign of injury or active bleeding.  We then removed the ports and switched to clean gowns, gloves, instruments and drapes.  The Pfannenstiel peritoneum was closed with a running 0 Vicryl suture.  The fascia was closed using 2, #1 Novafil running sutures.  The subcutaneous tissue was reapproximated using a running 2-0 Vicryl suture and the skin was closed using 4-0 Vicryl suture and a honeycomb dressing was applied.  The remaining port sites  were closed using interrupted 4-0 Vicryl suture and Dermabond.  Drain site was inspected.  There was a large cavity underneath the skin.  This was opened slightly to allow for drainage and packed with a moistened 4 x 4 sponge and covered with a sterile dressing.  The patient was then awakened from anesthesia and sent to the postanesthesia care unit in stable condition.  All counts were correct per operating room staff.  An MD assistant was necessary for tissue manipulation, retraction and positioning due to the complexity of the case and hospital policies  Vanita Panda, MD  Colorectal and General Surgery [facility]

## 2023-05-31 NOTE — Anesthesia Procedure Notes (Signed)
Procedure Name: Intubation Date/Time: 05/31/2023 12:20 PM  Performed by: Lily Lovings, CRNAPre-anesthesia Checklist: Patient identified, Patient being monitored, Timeout performed, Emergency Drugs available and Suction available Patient Re-evaluated:Patient Re-evaluated prior to induction Oxygen Delivery Method: Circle system utilized Preoxygenation: Pre-oxygenation with 100% oxygen Induction Type: IV induction Ventilation: Mask ventilation without difficulty Laryngoscope Size: Mac and 3 Grade View: Grade III Tube type: Oral Tube size: 7.0 mm Number of attempts: 1 Airway Equipment and Method: Stylet Placement Confirmation: ETT inserted through vocal cords under direct vision, positive ETCO2 and breath sounds checked- equal and bilateral Secured at: 19 cm Tube secured with: Tape Dental Injury: Teeth and Oropharynx as per pre-operative assessment  Difficulty Due To: Difficult Airway- due to reduced neck mobility Comments: Dl times with mac 3 blade by CRNA, grade 3 view. Repositioned head and pillows. MDA DL times 1 with MAC 3 blade and 7.0 ETT placed atraumatically.

## 2023-05-31 NOTE — Transfer of Care (Signed)
Immediate Anesthesia Transfer of Care Note  Patient: Jillian Montgomery  Procedure(s) Performed: XI ROBOT ASSISTED LAPAROSCOPIC LOW ANTERIOR RESECTION, SPLENIC FLEXURE MOBILIZATION, AND INTRAOPERATIVE ASSESSMENT OF PERFUSION USING ICG DYE  Patient Location: PACU  Anesthesia Type:General  Level of Consciousness: awake and patient cooperative  Airway & Oxygen Therapy: Patient Spontanous Breathing and Patient connected to face mask  Post-op Assessment: Report given to RN and Post -op Vital signs reviewed and stable  Post vital signs: Reviewed and stable  Last Vitals:  Vitals Value Taken Time  BP    Temp    Pulse 78 05/31/23 1528  Resp 14 05/31/23 1528  SpO2 98 % 05/31/23 1528  Vitals shown include unfiled device data.  Last Pain:  Vitals:   05/31/23 1039  TempSrc: Oral         Complications: No notable events documented.

## 2023-05-31 NOTE — Anesthesia Postprocedure Evaluation (Signed)
Anesthesia Post Note  Patient: Jillian Montgomery  Procedure(s) Performed: XI ROBOT ASSISTED LAPAROSCOPIC LOW ANTERIOR RESECTION, SPLENIC FLEXURE MOBILIZATION, AND INTRAOPERATIVE ASSESSMENT OF PERFUSION USING ICG DYE     Patient location during evaluation: PACU Anesthesia Type: General Level of consciousness: awake and alert Pain management: pain level controlled Vital Signs Assessment: post-procedure vital signs reviewed and stable Respiratory status: spontaneous breathing, nonlabored ventilation, respiratory function stable and patient connected to nasal cannula oxygen Cardiovascular status: blood pressure returned to baseline and stable Postop Assessment: no apparent nausea or vomiting Anesthetic complications: no   No notable events documented.  Last Vitals:  Vitals:   05/31/23 1752 05/31/23 1842  BP: (!) 125/51 (!) 117/57  Pulse: 74 70  Resp:  16  Temp:    SpO2: 96% 100%    Last Pain:  Vitals:   05/31/23 1724  TempSrc:   PainSc: 6                  Trevor Iha

## 2023-05-31 NOTE — H&P (Signed)
Subjective   Chief Complaint: Wound Check (Diverticulitis of large intestine with abscess/)       History of Present Illness: Jillian Montgomery is a 67 y.o. female who is seen today as an office consultation at the request of Dr. Katrinka Blazing for evaluation of Wound Check (Diverticulitis of large intestine with abscess/) .  67 year old female with chronic kidney disease and type 2 diabetes who presents to the office for evaluation of diverticulitis with abscess.  She was hospitalized in mid June 2020 for with diverticulitis and abscess.  She underwent IR guided drain placement.  She has had this repositioned several times.  There seems to be a persistent abscess cavity and it is thought that the drain is positioned within a large colonic diverticulum.  Most recent hemoglobin A1c was 9.0 down from 10.9.  Colonoscopy completed with no masses noted.  She is having trouble with her drain.  She is having output around the drain but nothing coming through the drain     Review of Systems: A complete review of systems was obtained from the patient.  I have reviewed this information and discussed as appropriate with the patient.  See HPI as well for other ROS.     Medical History: Past Medical History: DiagnosisDate            Chronic kidney disease                      Diabetes mellitus without complication (CMS/HHS-HCC)                 Glaucoma (increased eye pressure)              Hypertension        Patient Active Problem List Diagnosis Type 2 diabetes mellitus treated with insulin (CMS/HHS-HCC) Essential hypertension Diverticulitis of large intestine with abscess CKD (chronic kidney disease) stage 3, GFR 30-59 ml/min (CMS/HHS-HCC)     Past Surgical History: ProcedureLateralityDate            HYSTERECTOMY                       Allergies AllergenReactions LisinoprilAngioedema and Swelling                         Pt stated, "lips swell up" MetforminDiarrhea     Current Outpatient  Medications on File Prior to Visit MedicationSigDispenseRefill            atenoloL-chlorthalidone (TENORETIC) 50-25 mg tablet       Take 1 tablet by mouth once daily                                insulin DETEMIR (LEVEMIR) injection (concentration 100 units/mL)           Inject subcutaneously                                  losartan (COZAAR) 100 MG tablet     Take 100 mg by mouth once daily                    No current facility-administered medications on file prior to visit.     Family History ProblemRelationAge of Onset            High blood pressure (  Hypertension)   Mother             Breast cancer  Mother             High blood pressure (Hypertension)   Sister        Social History   Tobacco Use Smoking StatusNever Smokeless TobaccoNever     Social History   Socioeconomic History Marital status:Single Tobacco Use Smoking status:Never Smokeless tobacco:Never Vaping Use Vaping status:Never Used Substance and Sexual Activity Alcohol ONG:EXBMW Drug UXL:KGMWN   Social Determinants of Health   Food Insecurity: No Food Insecurity (12/26/2022)             Received from [facility]             Hunger Vital Sign                        Worried About Running Out of Food in the Last Year: Never true                        Ran Out of Food in the Last Year: Never true Transportation Needs: No Transportation Needs (12/26/2022)             Received from [facility] - Transportation                        Lack of Transportation (Medical): No                        Lack of Transportation (Non-Medical): No             Received from [facility]             Social Network     Objective:     Vitals:   05/31/23 1039  BP: (!) 144/61  Pulse: (!) 56  Resp: 17  Temp: (!) 97.3 F (36.3 C)  SpO2: 100%        Exam Gen: NAD CV: RRR Lungs: CTA Abd: soft, drain in place with min output     Labs, Imaging and Diagnostic Testing: CT reviewed.  Hospital  notes reviewed as well.  GI notes reviewed.   Patient had colonoscopy performed March 07, 2023.  This showed sigmoid diverticulosis with evidence of recent diverticulitis.  There was also a slight terminal ileal stricture which was biopsied.   Assessment and Plan: There are no diagnoses linked to this encounter.    67 year old female hospitalized for perforated diverticulitis.  She has a persistent drain in place.  She will undergo a colonoscopy later this month.  Her hemoglobin A1c was greater than 10 during hospitalization.  I have recommended less than 8 before proceeding with surgery.  She has seen her primary care physician and her glucose control has been adjusted.  Her hemoglobin A1c is down to 9.  Her blood sugar levels are better on the new regimen.  I think it would be reasonable to proceed with surgery at this point.   The surgery and anatomy were described to the patient as well as the risks of surgery and the possible complications.  These include: Bleeding, deep abdominal infections and possible wound complications such as hernia and infection, damage to adjacent structures, leak of surgical connections, which can lead to other surgeries and possibly an ostomy, possible need for  other procedures, such as abscess drains in radiology, possible prolonged hospital stay, possible diarrhea from removal of part of the colon, possible constipation from narcotics, possible bowel, bladder or sexual dysfunction if having rectal surgery, prolonged fatigue/weakness or appetite loss, possible early recurrence of of disease, possible complications of their medical problems such as heart disease or arrhythmias or lung problems, death (less than 1%). I believe the patient understands and wishes to proceed with the surgery.      Vanita Panda, MD Colon and Rectal Surgery [facility] Surgery

## 2023-06-01 LAB — CBC
HCT: 28.5 % — ABNORMAL LOW (ref 36.0–46.0)
Hemoglobin: 8.9 g/dL — ABNORMAL LOW (ref 12.0–15.0)
MCH: 26.1 pg (ref 26.0–34.0)
MCHC: 31.2 g/dL (ref 30.0–36.0)
MCV: 83.6 fL (ref 80.0–100.0)
Platelets: 286 10*3/uL (ref 150–400)
RBC: 3.41 MIL/uL — ABNORMAL LOW (ref 3.87–5.11)
RDW: 14.9 % (ref 11.5–15.5)
WBC: 14.6 10*3/uL — ABNORMAL HIGH (ref 4.0–10.5)
nRBC: 0 % (ref 0.0–0.2)

## 2023-06-01 LAB — BASIC METABOLIC PANEL
Anion gap: 11 (ref 5–15)
BUN: 45 mg/dL — ABNORMAL HIGH (ref 8–23)
CO2: 17 mmol/L — ABNORMAL LOW (ref 22–32)
Calcium: 8.1 mg/dL — ABNORMAL LOW (ref 8.9–10.3)
Chloride: 94 mmol/L — ABNORMAL LOW (ref 98–111)
Creatinine, Ser: 1.93 mg/dL — ABNORMAL HIGH (ref 0.44–1.00)
GFR, Estimated: 28 mL/min — ABNORMAL LOW (ref >60)
Glucose, Bld: 259 mg/dL — ABNORMAL HIGH (ref 70–99)
Potassium: 4.2 mmol/L (ref 3.5–5.1)
Sodium: 122 mmol/L — ABNORMAL LOW (ref 135–145)

## 2023-06-01 LAB — GLUCOSE, CAPILLARY
Glucose-Capillary: 239 mg/dL — ABNORMAL HIGH (ref 70–99)
Glucose-Capillary: 201 mg/dL — ABNORMAL HIGH (ref 70–99)
Glucose-Capillary: 89 mg/dL (ref 70–99)
Glucose-Capillary: 179 mg/dL — ABNORMAL HIGH (ref 70–99)

## 2023-06-01 MED ORDER — INSULIN ASPART 100 UNIT/ML IJ SOLN: 0.0000 [IU] | Freq: Every day | INTRAMUSCULAR | Status: DC

## 2023-06-01 MED ORDER — INSULIN ASPART 100 UNIT/ML IJ SOLN
0.0000 [IU] | Freq: Three times a day (TID) | INTRAMUSCULAR | Status: DC
  Administered 2023-06-02: 3 [IU] via SUBCUTANEOUS
  Administered 2023-06-02 (×2): 4 [IU] via SUBCUTANEOUS
  Administered 2023-06-03: 3 [IU] via SUBCUTANEOUS
  Administered 2023-06-04: 7 [IU] via SUBCUTANEOUS
  Administered 2023-06-04 – 2023-06-05 (×4): 4 [IU] via SUBCUTANEOUS

## 2023-06-01 MED ORDER — TRAMADOL HCL 50 MG PO TABS: 50.0000 mg | ORAL_TABLET | Freq: Four times a day (QID) | ORAL | Status: DC | PRN

## 2023-06-01 MED ORDER — ENOXAPARIN SODIUM 30 MG/0.3ML IJ SOSY
30.0000 mg | PREFILLED_SYRINGE | INTRAMUSCULAR | Status: DC
  Administered 2023-06-02 – 2023-06-03 (×2): 30 mg via SUBCUTANEOUS
  Filled 2023-06-01 (×2): qty 0.3

## 2023-06-01 MED ORDER — PHENOL 1.4 % MT LIQD
1.0000 | OROMUCOSAL | Status: DC | PRN
  Administered 2023-06-01: 1 via OROMUCOSAL
  Filled 2023-06-01: qty 177

## 2023-06-01 MED ORDER — KCL IN DEXTROSE-NACL 20-5-0.45 MEQ/L-%-% IV SOLN
INTRAVENOUS | Status: DC
  Filled 2023-06-01 (×2): qty 1000

## 2023-06-01 MED ORDER — LACTATED RINGERS IV BOLUS
250.0000 mL | Freq: Once | INTRAVENOUS | Status: AC
  Administered 2023-06-01: 250 mL via INTRAVENOUS

## 2023-06-01 MED ORDER — INSULIN ASPART 100 UNIT/ML IJ SOLN
3.0000 [IU] | Freq: Three times a day (TID) | INTRAMUSCULAR | Status: DC
  Administered 2023-06-02 – 2023-06-05 (×4): 3 [IU] via SUBCUTANEOUS

## 2023-06-01 NOTE — Discharge Instructions (Addendum)
ABDOMINAL SURGERY: POST OP INSTRUCTIONS  DIET: Follow a light bland diet the first 24 hours after arrival home, such as soup, liquids, crackers, etc.  Be sure to include lots of fluids daily.  Avoid fast food or heavy meals as your are more likely to get nauseated.  Do not eat any uncooked fruits or vegetables for the next 2 weeks as your colon heals. Take your usually prescribed home medications unless otherwise directed. PAIN CONTROL: Pain is best controlled by a usual combination of three different methods TOGETHER: Ice/Heat Over the counter pain medication Prescription pain medication Most patients will experience some swelling and bruising around the incisions.  Ice packs or heating pads (30-60 minutes up to 6 times a day) will help. Use ice for the first few days to help decrease swelling and bruising, then switch to heat to help relax tight/sore spots and speed recovery.  Some people prefer to use ice alone, heat alone, alternating between ice & heat.  Experiment to what works for you.  Swelling and bruising can take several weeks to resolve.   It is helpful to take an over-the-counter pain medication regularly for the first few weeks.  Choose one of the following that works best for you: Naproxen (Aleve, etc)  Two 220mg  tabs twice a day Ibuprofen (Advil, etc) Three 200mg  tabs four times a day (every meal & bedtime) Acetaminophen (Tylenol, etc) 500-650mg  four times a day (every meal & bedtime) A  prescription for pain medication (such as oxycodone, hydrocodone, etc) should be given to you upon discharge.  Take your pain medication as prescribed.  If you are having problems/concerns with the prescription medicine (does not control pain, nausea, vomiting, rash, itching, etc), please call us [phone] to see if we need to switch you to a different pain medicine that will work better for you and/or control your side effect better. If you need a refill on your pain medication, please contact  your pharmacy.  They will contact our office to request authorization. Prescriptions will not be filled after 5 pm or on week-ends. Avoid getting constipated.  Between the surgery and the pain medications, it is common to experience some constipation.  Increasing fluid intake and taking a fiber supplement (such as Metamucil, Citrucel, FiberCon, MiraLax, etc) 1-2 times a day regularly will usually help prevent this problem from occurring.  A mild laxative (prune juice, Milk of Magnesia, MiraLax, etc) should be taken according to package directions if there are no bowel movements after 48 hours.   Watch out for diarrhea.  If you have many loose bowel movements, simplify your diet to bland foods & liquids for a few days.  Stop any stool softeners and decrease your fiber supplement.  Switching to mild anti-diarrheal medications (Kayopectate, Pepto Bismol) can help.  If this worsens or does not improve, please call us. Wash / shower every day.  You may shower over the incision / wound.  Avoid baths until the skin is fully healed.  Continue to shower over incision(s) after the dressing is off. You have an open wound on your left abdomen.  Dressing should be removed and wound cleaned with soap and water daily.  Pack with moistened gauze and cover with a dry dressing.  Do this daily. You may leave the other incisions open to air.  You may replace a dressing/Band-Aid to cover the incision for comfort if you wish. ACTIVITIES as tolerated:   You may resume regular (light) daily activities beginning the next day--such as  daily self-care, walking, climbing stairs--gradually increasing activities as tolerated.  If you can walk 30 minutes without difficulty, it is safe to try more intense activity such as jogging, treadmill, bicycling, low-impact aerobics, swimming, etc. Save the most intensive and strenuous activity for last such as sit-ups, heavy lifting, contact sports, etc  Refrain from any heavy lifting or straining  until you are off narcotics for pain control.   DO NOT PUSH THROUGH PAIN.  Let pain be your guide: If it hurts to do something, don't do it.  Pain is your body warning you to avoid that activity for another week until the pain goes down. You may drive when you are no longer taking prescription pain medication, you can comfortably wear a seatbelt, and you can safely maneuver your car and apply brakes. You may have sexual intercourse when it is comfortable.  FOLLOW UP in our office Please call [facility] at [phone] to set up an appointment to see your surgeon in the office for a follow-up appointment approximately 1-2 weeks after your surgery. Make sure that you call for this appointment the day you arrive home to insure a convenient appointment time. 10. IF YOU HAVE DISABILITY OR FAMILY LEAVE FORMS, BRING THEM TO THE OFFICE FOR PROCESSING.  DO NOT GIVE THEM TO YOUR DOCTOR.   WHEN TO CALL us [phone]: Poor pain control Reactions / problems with new medications (rash/itching, nausea, etc)  Fever over 101.5 F (38.5 C) Inability to urinate Nausea and/or vomiting Worsening swelling or bruising Continued bleeding from incision. Increased pain, redness, or drainage from the incision  The clinic staff is available to answer your questions during regular business hours (8:30am-5pm).  Please don't hesitate to call and ask to speak to one of our nurses for clinical concerns.   A surgeon from [facility] is always on call at the hospitals   If you have a medical emergency, go to the nearest emergency room or call 911.    [facility] [address], [place], [place]  [address] ? MAIN: [phone] ? TOLL FREE: [phone] ? FAX [phone] [email or web address]

## 2023-06-01 NOTE — Plan of Care (Signed)
Plan of Care reviewed. 

## 2023-06-01 NOTE — Progress Notes (Signed)
1 Day Post-Op Robotic LAR Subjective: Pain controlled, no nausea but belching a bit.  Felt dizzy with ambulation.  No bowel function yet  Objective: Vital signs in last 24 hours: Temp:  [97.3 F (36.3 C)-98.4 F (36.9 C)] 97.5 F (36.4 C) (11/15 0510) Pulse Rate:  [56-81] 73 (11/15 0510) Resp:  [14-17] 16 (11/15 0510) BP: (115-144)/(51-64) 115/62 (11/15 0510) SpO2:  [92 %-100 %] 100 % (11/15 0510) Weight:  [64.9 kg-67.3 kg] 67.3 kg (11/15 0500)   Intake/Output from previous day: 11/14 0701 - 11/15 0700 In: 2406.9 [P.O.:240; I.V.:1966.9; IV Piggyback:200] Out: 9147 [WGNFA:2130; Blood:30] Intake/Output this shift: No intake/output data recorded.   General appearance: alert and cooperative GI: normal findings: soft, non-distended  Incision: no significant drainage  Lab Results:  Recent Labs    06/01/23 0449  WBC 14.6*  HGB 8.9*  HCT 28.5*  PLT 286   BMET Recent Labs    06/01/23 0449  NA 122*  K 4.2  CL 94*  CO2 17*  GLUCOSE 259*  BUN 45*  CREATININE 1.93*  CALCIUM 8.1*   PT/INR No results for input(s): "LABPROT", "INR" in the last 72 hours. ABG No results for input(s): "PHART", "HCO3" in the last 72 hours.  Invalid input(s): "PCO2", "PO2"  MEDS, Scheduled  acetaminophen  1,000 mg Oral Q6H   alvimopan  12 mg Oral BID   atenolol  50 mg Oral Daily   And   chlorthalidone  25 mg Oral Daily   enoxaparin (LOVENOX) injection  40 mg Subcutaneous Q24H   feeding supplement  237 mL Oral BID BM   gabapentin  300 mg Oral BID   insulin aspart  0-20 Units Subcutaneous TID WC   insulin aspart  0-5 Units Subcutaneous QHS   losartan  100 mg Oral Daily   saccharomyces boulardii  250 mg Oral BID    Studies/Results: No results found.  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

## 2023-06-01 NOTE — Progress Notes (Signed)
   06/01/23 1109  TOC Brief Assessment  Insurance and Status Reviewed  Patient has primary care physician Yes  Home environment has been reviewed Patient resides alone in a single family home  Prior level of function: Independent at baseline  Prior/Current Home Services No current home services  Social Determinants of Health Reivew SDOH reviewed no interventions necessary  Readmission risk has been reviewed Yes  Transition of care needs no transition of care needs at this time

## 2023-06-01 NOTE — Inpatient Diabetes Management (Signed)
Inpatient Diabetes Program Recommendations  AACE/ADA: New Consensus Statement on Inpatient Glycemic Control (2015)  Target Ranges:  Prepandial:   less than 140 mg/dL      Peak postprandial:   less than 180 mg/dL (1-2 hours)      Critically ill patients:  140 - 180 mg/dL   Lab Results  Component Value Date   GLUCAP 239 (H) 06/01/2023   HGBA1C 7.6 (H) 05/21/2023    Review of Glycemic Control  Latest Reference Range & Units 05/31/23 10:44 05/31/23 15:36 05/31/23 17:30 05/31/23 21:49 06/01/23 07:14  Glucose-Capillary 70 - 99 mg/dL 161 (H) 096 (H) 045 (H) 244 (H) 239 (H)   Diabetes history: DM 2 Outpatient Diabetes medications: Tresiba 20 units qhs Current orders for Inpatient glycemic control:  Novolog 0-20 units tid + hs  A1c 7.6% on 11/4 Note: Decadron 10 mg given during procedure yesterday causing hyperglycemia today in the low to mid 200's.  Inpatient Diabetes Program Recommendations:    -  Add Novolog 3 units tid meal coverage if eating >50% of meals  Thanks,  Christena Deem RN, MSN, BC-ADM Inpatient Diabetes Coordinator Team Pager [phone] (8a-5p)

## 2023-06-01 NOTE — Plan of Care (Signed)
  Problem: Education: Goal: Understanding of discharge needs will improve Outcome: Progressing Goal: Verbalization of understanding of the causes of altered bowel function will improve Outcome: Progressing

## 2023-06-02 DIAGNOSIS — K632 Fistula of intestine: Secondary | ICD-10-CM | POA: Diagnosis not present

## 2023-06-02 DIAGNOSIS — E871 Hypo-osmolality and hyponatremia: Secondary | ICD-10-CM | POA: Diagnosis present

## 2023-06-02 DIAGNOSIS — N179 Acute kidney failure, unspecified: Secondary | ICD-10-CM | POA: Diagnosis present

## 2023-06-02 DIAGNOSIS — D509 Iron deficiency anemia, unspecified: Secondary | ICD-10-CM | POA: Diagnosis present

## 2023-06-02 LAB — CBC
HCT: 24.3 % — ABNORMAL LOW (ref 36.0–46.0)
Hemoglobin: 7.6 g/dL — ABNORMAL LOW (ref 12.0–15.0)
MCH: 25.9 pg — ABNORMAL LOW (ref 26.0–34.0)
MCHC: 31.3 g/dL (ref 30.0–36.0)
MCV: 82.9 fL (ref 80.0–100.0)
Platelets: 262 10*3/uL (ref 150–400)
RBC: 2.93 MIL/uL — ABNORMAL LOW (ref 3.87–5.11)
RDW: 15.4 % (ref 11.5–15.5)
WBC: 12.3 10*3/uL — ABNORMAL HIGH (ref 4.0–10.5)
nRBC: 0 % (ref 0.0–0.2)

## 2023-06-02 LAB — BASIC METABOLIC PANEL
Anion gap: 7 (ref 5–15)
BUN: 40 mg/dL — ABNORMAL HIGH (ref 8–23)
CO2: 19 mmol/L — ABNORMAL LOW (ref 22–32)
Calcium: 7.8 mg/dL — ABNORMAL LOW (ref 8.9–10.3)
Chloride: 99 mmol/L (ref 98–111)
Creatinine, Ser: 1.58 mg/dL — ABNORMAL HIGH (ref 0.44–1.00)
GFR, Estimated: 35 mL/min — ABNORMAL LOW (ref >60)
Glucose, Bld: 200 mg/dL — ABNORMAL HIGH (ref 70–99)
Potassium: 3.9 mmol/L (ref 3.5–5.1)
Sodium: 125 mmol/L — ABNORMAL LOW (ref 135–145)

## 2023-06-02 LAB — IRON AND TIBC
Iron: 10 ug/dL — ABNORMAL LOW (ref 28–170)
Saturation Ratios: 4 % — ABNORMAL LOW (ref 10.4–31.8)
TIBC: 232 ug/dL — ABNORMAL LOW (ref 250–450)
UIBC: 222 ug/dL

## 2023-06-02 LAB — PREPARE RBC (CROSSMATCH)

## 2023-06-02 LAB — RETICULOCYTES
Immature Retic Fract: 11.1 % (ref 2.3–15.9)
RBC.: 2.85 MIL/uL — ABNORMAL LOW (ref 3.87–5.11)
Retic Count, Absolute: 47.3 10*3/uL (ref 19.0–186.0)
Retic Ct Pct: 1.7 % (ref 0.4–3.1)

## 2023-06-02 LAB — GLUCOSE, CAPILLARY
Glucose-Capillary: 197 mg/dL — ABNORMAL HIGH (ref 70–99)
Glucose-Capillary: 188 mg/dL — ABNORMAL HIGH (ref 70–99)
Glucose-Capillary: 121 mg/dL — ABNORMAL HIGH (ref 70–99)
Glucose-Capillary: 115 mg/dL — ABNORMAL HIGH (ref 70–99)

## 2023-06-02 LAB — FERRITIN: Ferritin: 342 ng/mL — ABNORMAL HIGH (ref 11–307)

## 2023-06-02 LAB — VITAMIN B12: Vitamin B-12: 468 pg/mL (ref 180–914)

## 2023-06-02 LAB — FOLATE: Folate: 6.9 ng/mL (ref >5.9)

## 2023-06-02 MED ORDER — LACTATED RINGERS IV BOLUS: 1000.0000 mL | Freq: Three times a day (TID) | INTRAVENOUS | Status: AC | PRN

## 2023-06-02 MED ORDER — PHENOL 1.4 % MT LIQD: 2.0000 | OROMUCOSAL | Status: DC | PRN

## 2023-06-02 MED ORDER — SALINE SPRAY 0.65 % NA SOLN: 1.0000 | Freq: Four times a day (QID) | NASAL | Status: DC | PRN

## 2023-06-02 MED ORDER — SODIUM CHLORIDE 0.9 % IV SOLN
100.0000 mg | Freq: Once | INTRAVENOUS | Status: AC
  Administered 2023-06-02: 100 mg via INTRAVENOUS
  Filled 2023-06-02: qty 5

## 2023-06-02 MED ORDER — NAPHAZOLINE-GLYCERIN 0.012-0.25 % OP SOLN: 1.0000 [drp] | Freq: Four times a day (QID) | OPHTHALMIC | Status: DC | PRN

## 2023-06-02 MED ORDER — METHOCARBAMOL 1000 MG/10ML IJ SOLN: 1000.0000 mg | Freq: Four times a day (QID) | INTRAMUSCULAR | Status: DC | PRN

## 2023-06-02 MED ORDER — METOPROLOL TARTRATE 5 MG/5ML IV SOLN: 5.0000 mg | Freq: Four times a day (QID) | INTRAVENOUS | Status: DC | PRN

## 2023-06-02 MED ORDER — METHOCARBAMOL 500 MG PO TABS: 1000.0000 mg | ORAL_TABLET | Freq: Four times a day (QID) | ORAL | Status: DC | PRN

## 2023-06-02 MED ORDER — OXYCODONE HCL 5 MG PO TABS
5.0000 mg | ORAL_TABLET | ORAL | Status: DC | PRN
  Administered 2023-06-02 (×2): 5 mg via ORAL
  Filled 2023-06-02 (×2): qty 1

## 2023-06-02 MED ORDER — SODIUM CHLORIDE 0.9% IV SOLUTION: Freq: Once | INTRAVENOUS | Status: AC

## 2023-06-02 MED ORDER — BISMUTH SUBSALICYLATE 262 MG/15ML PO SUSP: 30.0000 mL | Freq: Three times a day (TID) | ORAL | Status: DC | PRN

## 2023-06-02 MED ORDER — CALCIUM POLYCARBOPHIL 625 MG PO TABS
625.0000 mg | ORAL_TABLET | Freq: Two times a day (BID) | ORAL | Status: DC
  Administered 2023-06-02 – 2023-06-05 (×7): 625 mg via ORAL
  Filled 2023-06-02 (×7): qty 1

## 2023-06-02 MED ORDER — GABAPENTIN 100 MG PO CAPS
300.0000 mg | ORAL_CAPSULE | Freq: Three times a day (TID) | ORAL | Status: DC
  Administered 2023-06-02 – 2023-06-05 (×10): 300 mg via ORAL
  Filled 2023-06-02 (×10): qty 3

## 2023-06-02 MED ORDER — MENTHOL 3 MG MT LOZG: 1.0000 | LOZENGE | OROMUCOSAL | Status: DC | PRN

## 2023-06-02 MED ORDER — MAGIC MOUTHWASH: 15.0000 mL | Freq: Four times a day (QID) | ORAL | Status: DC | PRN

## 2023-06-02 NOTE — Plan of Care (Signed)
  Problem: Education: Goal: Understanding of discharge needs will improve Outcome: Progressing Goal: Verbalization of understanding of the causes of altered bowel function will improve Outcome: Progressing   Problem: Activity: Goal: Ability to tolerate increased activity will improve Outcome: Progressing   Problem: Bowel/Gastric: Goal: Gastrointestinal status for postoperative course will improve Outcome: Progressing   Problem: Health Behavior/Discharge Planning: Goal: Identification of community resources to assist with postoperative recovery needs will improve Outcome: Progressing   Problem: Nutritional: Goal: Will attain and maintain optimal nutritional status will improve Outcome: Progressing   Problem: Clinical Measurements: Goal: Postoperative complications will be avoided or minimized Outcome: Progressing   Problem: Respiratory: Goal: Respiratory status will improve Outcome: Progressing   Problem: Skin Integrity: Goal: Will show signs of wound healing Outcome: Progressing   Problem: Education: Goal: Knowledge of General Education information will improve Description: Including pain rating scale, medication(s)/side effects and non-pharmacologic comfort measures Outcome: Progressing   Problem: Health Behavior/Discharge Planning: Goal: Ability to manage health-related needs will improve Outcome: Progressing   Problem: Clinical Measurements: Goal: Ability to maintain clinical measurements within normal limits will improve Outcome: Progressing Goal: Will remain free from infection Outcome: Progressing Goal: Diagnostic test results will improve Outcome: Progressing Goal: Respiratory complications will improve Outcome: Progressing Goal: Cardiovascular complication will be avoided Outcome: Progressing   Problem: Activity: Goal: Risk for activity intolerance will decrease Outcome: Progressing   Problem: Nutrition: Goal: Adequate nutrition will be  maintained Outcome: Progressing   Problem: Coping: Goal: Level of anxiety will decrease Outcome: Progressing   Problem: Elimination: Goal: Will not experience complications related to bowel motility Outcome: Progressing Goal: Will not experience complications related to urinary retention Outcome: Progressing   Problem: Pain Management: Goal: General experience of comfort will improve Outcome: Progressing   Problem: Safety: Goal: Ability to remain free from injury will improve Outcome: Progressing   Problem: Skin Integrity: Goal: Risk for impaired skin integrity will decrease Outcome: Progressing   Problem: Education: Goal: Ability to describe self-care measures that may prevent or decrease complications (Diabetes Survival Skills Education) will improve Outcome: Progressing Goal: Individualized Educational Video(s) Outcome: Progressing   Problem: Coping: Goal: Ability to adjust to condition or change in health will improve Outcome: Progressing   Problem: Fluid Volume: Goal: Ability to maintain a balanced intake and output will improve Outcome: Progressing   Problem: Health Behavior/Discharge Planning: Goal: Ability to identify and utilize available resources and services will improve Outcome: Progressing Goal: Ability to manage health-related needs will improve Outcome: Progressing   Problem: Metabolic: Goal: Ability to maintain appropriate glucose levels will improve Outcome: Progressing   Problem: Nutritional: Goal: Maintenance of adequate nutrition will improve Outcome: Progressing Goal: Progress toward achieving an optimal weight will improve Outcome: Progressing   Problem: Skin Integrity: Goal: Risk for impaired skin integrity will decrease Outcome: Progressing   Problem: Tissue Perfusion: Goal: Adequacy of tissue perfusion will improve Outcome: Progressing

## 2023-06-02 NOTE — Progress Notes (Signed)
Mobility Specialist - Progress Note   06/02/23 1051  Mobility  Activity Ambulated with assistance in hallway  Level of Assistance Standby assist, set-up cues, supervision of patient - no hands on  Assistive Device Front wheel walker  Distance Ambulated (ft) 250 ft  Activity Response Tolerated well  Mobility Referral Yes  $Mobility charge 1 Mobility  Mobility Specialist Start Time (ACUTE ONLY) 1003  Mobility Specialist Stop Time (ACUTE ONLY) 1019  Mobility Specialist Time Calculation (min) (ACUTE ONLY) 16 min   Pt received in bed and agreeable to mobility. Pt c/o IV burning during session. RN made aware. No other complaints during session. Pt to recliner after session with all needs met.    [facility]

## 2023-06-02 NOTE — Consult Note (Signed)
Initial Consultation Note   Patient: Jillian Montgomery IHK:[id number] DOB: 06/29/1956 PCP: Merri Brunette, MD DOA: 05/31/2023 DOS: the patient was seen and examined on 06/02/2023 Primary service: Romie Levee, MD  Referring physician: Karie Soda, MD Reason for consult: Symptomatic anemia, AKI, medical management.  Assessment/Plan: Principal Problem:   Colocutaneous fistula   Diverticulitis of large intestine with abscess Continue postop management per general surgery. Active Problems:   AKI (acute kidney injury) (HCC) Superimposed on:   CKD (chronic kidney disease) stage 3, GFR 30-59 ml/min (HCC) Hold ARB/ACE. Hold diuretic. Avoid hypotension. Avoid nephrotoxins. Monitor intake and output. Monitor renal function/electrolytes. Will be transfused 1 unit of PRBC for better renal perfusion.    HTN (hypertension) Hold antihypertensives.    Type 2 diabetes mellitus treated with insulin (HCC) Carbohydrate modified diet. CBG monitoring with RI SS. Check hemoglobin A1c. Continue gabapentin for neuropathy.    Hyponatremia Diuretic has been held. Fluid restriction. Follow-up sodium level.    IDA (iron deficiency anemia) Received iron infusion. Received 1 unit of PRBC. Follow-up hematocrit and hemoglobin. Transfuse further as needed.  TR[facility] will continue to follow the patient.  HPI: Jillian Montgomery is a 67 y.o. female with past medical history of stage III CKD, hyperlipidemia, pancreatitis, hypertension, type 2 diabetes, diverticulosis, diverticulitis, history of colocutaneous fistula who is on postop day #2 of robot-assisted laparoscopic low anterior resection, splenic flexure mobilization and intraoperative assessment of perfusion using ICG dye who we are seeing for hypotension, symptomatic anemia and AKI.  She denied fever, chills, rhinorrhea, sore throat, wheezing or hemoptysis.  No chest pain, palpitations, diaphoresis, PND, orthopnea or pitting edema of the lower  extremities.  No nausea, emesis, diarrhea, constipation.  No flank pain, dysuria, frequency or hematuria.  No polyuria, polydipsia, polyphagia or blurred vision.   Review of Systems: As mentioned in the history of present illness. All other systems reviewed and are negative. Past Medical History:  Diagnosis Date   Chronic kidney disease, stage III (moderate) (HCC)    Liver disease    Other and unspecified hyperlipidemia    Pancreatitis    Type II or unspecified type diabetes mellitus without mention of complication, not stated as uncontrolled    Unspecified essential hypertension    Past Surgical History:  Procedure Laterality Date   IR CATHETER TUBE CHANGE  01/03/2023   IR CATHETER TUBE CHANGE  04/04/2023   IR PATIENT EVAL TECH 0-60 MINS  05/04/2023   IR RADIOLOGIST EVAL & MGMT  01/19/2023   PARTIAL HYSTERECTOMY     Social History:  reports that she has never smoked. She has never used smokeless tobacco. She reports that she does not currently use alcohol. She reports that she does not use drugs.  Allergies  Allergen Reactions   Lisinopril Swelling    Pt stated, "lips swell up"   Metformin Diarrhea    Family History  Problem Relation Age of Onset   Breast cancer Mother    Crohn's disease Sister    Colon cancer Paternal Aunt    Diabetes Paternal Aunt        x 2   HIV Son     Prior to Admission medications   Medication Sig Start Date End Date Taking? Authorizing Provider  atenolol-chlorthalidone (TENORETIC) 50-25 MG tablet Take 1 tablet by mouth daily. 01/18/23  Yes [provider]  Continuous Glucose Receiver (DEXCOM G7 RECEIVER) DEVI check sugars at least 4-6 times a day for 30 days 02/07/23  Yes [provider]  losartan (COZAAR)  100 MG tablet Take 100 mg by mouth daily.   Yes [provider]  TRESIBA FLEXTOUCH 100 UNIT/ML FlexTouch Pen Inject 20 Units into the skin at bedtime. 01/30/23  Yes [provider]  BD PEN NEEDLE NANO 2ND GEN 32G  X 4 MM MISC AS DIRECTED INVITRO USE WITH LANTUS ONCE A DAY 90 DAYS 01/19/23   [provider]  insulin detemir (LEVEMIR) 100 UNIT/ML injection Inject 0.1 mLs (10 Units total) into the skin at bedtime. Patient not taking: Reported on 05/15/2023 01/04/23   Rolly Salter, MD  ns flush (NS FLUSH) 0.9% SOLN Flush once daily with 5 ml. Patient not taking: Reported on 05/15/2023 01/19/23     oxyCODONE-acetaminophen (PERCOCET/ROXICET) 5-325 MG tablet Take 1 tablet by mouth every 6 (six) hours as needed for severe pain or moderate pain. Patient not taking: Reported on 05/15/2023 01/04/23   Rolly Salter, MD  polyethylene glycol powder (GLYCOLAX/MIRALAX) 17 GM/SCOOP powder Mix 1 capful (17 g total) in 8 oz clear liquid and drink by mouth once daily. Patient not taking: Reported on 05/15/2023 01/05/23   Rolly Salter, MD    Physical Exam: Vitals:   06/02/23 0124 06/02/23 0500 06/02/23 0541 06/02/23 0930  BP: (!) 106/50  (!) 103/45 (!) 106/49  Pulse: 69  71 71  Resp: 18  16 18   Temp: 98.2 F (36.8 C)  97.9 F (36.6 C) 97.7 F (36.5 C)  TempSrc: Oral  Oral Oral  SpO2: 100%  100% 100%  Weight:  63.9 kg    Height:       Physical Exam Vitals and nursing note reviewed.  Constitutional:      General: She is awake. She is not in acute distress.    Appearance: Normal appearance. She is ill-appearing.  HENT:     Head: Normocephalic.     Nose: No rhinorrhea.     Mouth/Throat:     Mouth: Mucous membranes are dry.  Eyes:     General: No scleral icterus.    Pupils: Pupils are equal, round, and reactive to light.  Neck:     Vascular: No JVD.  Cardiovascular:     Rate and Rhythm: Normal rate and regular rhythm.     Heart sounds: S1 normal and S2 normal.  Pulmonary:     Effort: Pulmonary effort is normal.     Breath sounds: Normal breath sounds.  Abdominal:     General: Bowel sounds are normal. There is no distension.     Palpations: Abdomen is soft.     Tenderness: There is abdominal  tenderness. There is no guarding.  Musculoskeletal:     Cervical back: Neck supple. No tenderness.     Right lower leg: No edema.     Left lower leg: No edema.  Skin:    General: Skin is warm and dry.  Neurological:     General: No focal deficit present.     Mental Status: She is alert and oriented to person, place, and time.  Psychiatric:        Mood and Affect: Mood normal.        Behavior: Behavior normal. Behavior is cooperative.    Data Reviewed:   Results are pending, will review when available.   Family Communication:  Primary team communication:  Thank you very much for involving [place] in the care of your patient.  Author: Bobette Mo, MD 06/02/2023 9:57 AM  For on call review [email or web address].   This document was prepared  using Conservation officer, historic buildings and may contain some unintended transcription errors.

## 2023-06-02 NOTE — Progress Notes (Signed)
06/02/2023  Jillian Montgomery [id number] 04-Feb-1956  CARE TEAM: PCP: Merri Brunette, MD  Outpatient Care Team: Patient Care Team: Merri Brunette, MD as PCP - General (Family Medicine)  Inpatient Treatment Team: Treatment Team:  Romie Levee, MD Merwyn Katos, NT Sherian Maroon, RN Deveron Furlong, RN Johnson, Femi [address], [place], [place], [place], [place]   Problem List:   Principal Problem:   Colocutaneous fistula Active Problems:   CKD (chronic kidney disease) stage 3, GFR 30-59 ml/min (HCC)   Diverticulitis of large intestine with abscess   HTN (hypertension)   Type 2 diabetes mellitus treated with insulin (HCC)   05/31/2023  POST-OPERATIVE DIAGNOSIS:  DIVERTICULAR DISEASE COLOCUTANEOUS FISTULA   PROCEDURE:   ROBOTIC LOW ANTERIOR RESECTION SPLENIC FLEXURE MOBILIZATION INTRAOPERATIVE ASSESSMENT OF PERFUSION USING ICG DYE   Surgeon:  Romie Levee, MD  OR FINDINGS:    Patient had significant left pelvic sidewall adhesions with fistula to an abscess cavity within the left lower quadrant subcutaneous tissue.   The anastomosis rests 10 cm from the anal verge by rigid proctoscopy.  Assessment [facility] Stay = 2 days) 2 Days Post-Op    OK    Plan:  ERAS pathway.  Follow-up on pathology.  Multimodal pain control.  Continue scheduled Tylenol.  Increase gabapentin.  Stronger meds p.o. and IV for breakthrough.  Ileus resolving.  Fiber bowel regimen with some Pepto-Bismol as needed.  Blood pressure has been rather soft.  No severe anemia.  Holding medications for now.  Chronic kidney disease with bump up in creatinine not too surprising.  Actually improving.  Try and keep on the dry side but gave IV fluid bolus x 1 this morning just in case.  Continue PRN (as needed) IV fluid for now  Hyponatremia.  Try and keep on the dry side and follow.  Given numerous complex issues, have asked internal medicine [facility] to help sort out to  make sure that we are not getting into trouble with hyponatremia and chronic kidney disease in the setting of postoperative anemia.  VTE prophylaxis- SCDs, etc  -mobilize as tolerated to help recovery.  To get up in the hallways when he send she is feeling rather tired and rundown.  Have therapies evaluate to see if she would benefit from home health.  Hopefully not the need for SNF.   -Disposition:  Disposition:  The patient is from: Home Anticipate discharge to:  Home with Home Health Anticipated Date of Discharge is:  November 19,2024   Barriers to discharge:  Consultant clearance & sign off  , Testing result pending, Therapy assessment & Recommendations pending, and Pending Clinical improvement (more likely than not)  Patient currently is NOT MEDICALLY STABLE for discharge from the hospital from a surgery standpoint.      I reviewed nursing notes, last 24 h vitals and pain scores, last 48 h intake and output, last 24 h labs and trends, and last 24 h imaging results.  I have reviewed this patient's available data, including medical history, events of note, test results, etc as part of my evaluation.   A significant portion of that time was spent in counseling. Care during the described time interval was provided by me.  This care required high  level of medical decision making.  06/02/2023    Subjective: (Chief complaint)  Patient feeling sore and tired but did walk in hallways.  Family in room.  Started to have diarrhea.  Not much of an appetite but trying some  p.o.  Objective:  Vital signs:  Vitals:   06/01/23 2045 06/02/23 0124 06/02/23 0500 06/02/23 0541  BP: (!) 104/48 (!) 106/50  (!) 103/45  Pulse: 67 69  71  Resp: 18 18  16   Temp: 97.8 F (36.6 C) 98.2 F (36.8 C)  97.9 F (36.6 C)  TempSrc: Oral Oral  Oral  SpO2: 100% 100%  100%  Weight:   63.9 kg   Height:        Last BM Date : 06/01/23  Intake/Output   Yesterday:  11/15 0701 - 11/16  0700 In: 2678.6 [P.O.:1080; I.V.:1598.6] Out: 450 [Urine:450] This shift:  No intake/output data recorded.  Bowel function:  Flatus: YES  BM:  YES  Drain: (No drain)   Physical Exam:  General: Pt awake/alert in mild acute distress.   Not toxic but tired. Eyes: PERRL, normal EOM.  Sclera clear.  No icterus Neuro: CN II-XII intact w/o focal sensory/motor deficits. Lymph: No head/neck/groin lymphadenopathy Psych:  No delerium/psychosis/paranoia.  Oriented x 4 HENT: Normocephalic, Mucus membranes moist.  No thrush Neck: Supple, No tracheal deviation.  No obvious thyromegaly Chest: No pain to chest wall compression.  Good respiratory excursion.  No audible wheezing CV:  Pulses intact.  Regular rhythm.  No major extremity edema MS: Normal AROM mjr joints.  No obvious deformity  Abdomen: Soft.  Nondistended.  Tenderness at midline incision and chronic left flank drain.  Left flank drain wound clean.  No evidence of peritonitis.  No incarcerated hernias.  Ext:   No deformity.  No mjr edema.  No cyanosis Skin: No petechiae / purpurea.  No major sores.  Warm and dry    Results:   Cultures: No results found for this or any previous visit (from the past 720 hour(s)).  Labs: Results for orders placed or performed during the hospital encounter of 05/31/23 (from the past 48 hour(s))  Glucose, capillary     Status: Abnormal   Collection Time: 05/31/23 10:44 AM  Result Value Ref Range   Glucose-Capillary 119 (H) 70 - 99 mg/dL    Comment: Glucose reference range applies only to samples taken after fasting for at least 8 hours.   Comment 1 Notify RN    Comment 2 Document in Chart   ABO/Rh     Status: None   Collection Time: 05/31/23 11:21 AM  Result Value Ref Range   ABO/RH(D)      O POS Performed at [facility], [address], [place], [place] [address]   Glucose, capillary     Status: Abnormal   Collection Time: 05/31/23  3:36 PM  Result Value Ref Range    Glucose-Capillary 197 (H) 70 - 99 mg/dL    Comment: Glucose reference range applies only to samples taken after fasting for at least 8 hours.   Comment 1 Notify RN   Glucose, capillary     Status: Abnormal   Collection Time: 05/31/23  5:30 PM  Result Value Ref Range   Glucose-Capillary 235 (H) 70 - 99 mg/dL    Comment: Glucose reference range applies only to samples taken after fasting for at least 8 hours.  Glucose, capillary     Status: Abnormal   Collection Time: 05/31/23  9:49 PM  Result Value Ref Range   Glucose-Capillary 244 (H) 70 - 99 mg/dL    Comment: Glucose reference range applies only to samples taken after fasting for at least 8 hours.  CBC     Status: Abnormal  Collection Time: 06/01/23  4:49 AM  Result Value Ref Range   WBC 14.6 (H) 4.0 - 10.5 K/uL   RBC 3.41 (L) 3.87 - 5.11 MIL/uL   Hemoglobin 8.9 (L) 12.0 - 15.0 g/dL   HCT 40.9 (L) 81.1 - 91.4 %   MCV 83.6 80.0 - 100.0 fL   MCH 26.1 26.0 - 34.0 pg   MCHC 31.2 30.0 - 36.0 g/dL   RDW 78.2 95.6 - 21.3 %   Platelets 286 150 - 400 K/uL   nRBC 0.0 0.0 - 0.2 %    Comment: Performed at [facility], [address], [place], [place] [address]  Basic metabolic panel     Status: Abnormal   Collection Time: 06/01/23  4:49 AM  Result Value Ref Range   Sodium 122 (L) 135 - 145 mmol/L   Potassium 4.2 3.5 - 5.1 mmol/L   Chloride 94 (L) 98 - 111 mmol/L   CO2 17 (L) 22 - 32 mmol/L   Glucose, Bld 259 (H) 70 - 99 mg/dL    Comment: Glucose reference range applies only to samples taken after fasting for at least 8 hours.   BUN 45 (H) 8 - 23 mg/dL   Creatinine, Ser 8.46 (H) 0.44 - 1.00 mg/dL   Calcium 8.1 (L) 8.9 - 10.3 mg/dL   GFR, Estimated 28 (L) >60 mL/min    Comment: (NOTE) Calculated using the CKD-EPI Creatinine Equation (2021)    Anion gap 11 5 - 15    Comment: Performed at [facility], [address] [place], [place] [address]  Glucose, capillary     Status: Abnormal    Collection Time: 06/01/23  7:14 AM  Result Value Ref Range   Glucose-Capillary 239 (H) 70 - 99 mg/dL    Comment: Glucose reference range applies only to samples taken after fasting for at least 8 hours.  Glucose, capillary     Status: Abnormal   Collection Time: 06/01/23 11:31 AM  Result Value Ref Range   Glucose-Capillary 201 (H) 70 - 99 mg/dL    Comment: Glucose reference range applies only to samples taken after fasting for at least 8 hours.  Glucose, capillary     Status: None   Collection Time: 06/01/23  4:48 PM  Result Value Ref Range   Glucose-Capillary 89 70 - 99 mg/dL    Comment: Glucose reference range applies only to samples taken after fasting for at least 8 hours.  Glucose, capillary     Status: Abnormal   Collection Time: 06/01/23  8:58 PM  Result Value Ref Range   Glucose-Capillary 179 (H) 70 - 99 mg/dL    Comment: Glucose reference range applies only to samples taken after fasting for at least 8 hours.  CBC     Status: Abnormal   Collection Time: 06/02/23  5:50 AM  Result Value Ref Range   WBC 12.3 (H) 4.0 - 10.5 K/uL   RBC 2.93 (L) 3.87 - 5.11 MIL/uL   Hemoglobin 7.6 (L) 12.0 - 15.0 g/dL   HCT 28.4 (L) 13.2 - 44.0 %   MCV 82.9 80.0 - 100.0 fL   MCH 25.9 (L) 26.0 - 34.0 pg   MCHC 31.3 30.0 - 36.0 g/dL   RDW 10.2 72.5 - 36.6 %   Platelets 262 150 - 400 K/uL   nRBC 0.0 0.0 - 0.2 %    Comment: Performed at [facility], [address], [place], [place] [address]  Basic metabolic panel  Status: Abnormal   Collection Time: 06/02/23  5:50 AM  Result Value Ref Range   Sodium 125 (L) 135 - 145 mmol/L   Potassium 3.9 3.5 - 5.1 mmol/L   Chloride 99 98 - 111 mmol/L   CO2 19 (L) 22 - 32 mmol/L   Glucose, Bld 200 (H) 70 - 99 mg/dL    Comment: Glucose reference range applies only to samples taken after fasting for at least 8 hours.   BUN 40 (H) 8 - 23 mg/dL   Creatinine, Ser 2.95 (H) 0.44 - 1.00 mg/dL   Calcium 7.8 (L) 8.9 - 10.3 mg/dL   GFR,  Estimated 35 (L) >60 mL/min    Comment: (NOTE) Calculated using the CKD-EPI Creatinine Equation (2021)    Anion gap 7 5 - 15    Comment: Performed at [facility], [address], [place], [place] [address]  Glucose, capillary     Status: Abnormal   Collection Time: 06/02/23  7:28 AM  Result Value Ref Range   Glucose-Capillary 197 (H) 70 - 99 mg/dL    Comment: Glucose reference range applies only to samples taken after fasting for at least 8 hours.   Comment 1 Notify RN     Imaging / Studies: No results found.  Medications / Allergies: per chart  Antibiotics: Anti-infectives (From admission, onward)    Start     Dose/Rate Route Frequency Ordered Stop   06/01/23 0000  cefoTEtan (CEFOTAN) 2 g in sodium chloride 0.9 % 100 mL IVPB        2 g 200 mL/hr over 30 Minutes Intravenous Every 12 hours 05/31/23 1712 06/01/23 0143   05/31/23 1045  cefoTEtan (CEFOTAN) 2 g in sodium chloride 0.9 % 100 mL IVPB        2 g 200 mL/hr over 30 Minutes Intravenous On call to O.R. 05/31/23 1031 05/31/23 1246         Note: Portions of this report may have been transcribed using voice recognition software. Every effort was made to ensure accuracy; however, inadvertent computerized transcription errors may be present.   Any transcriptional errors that result from this process are unintentional.    Ardeth Sportsman, MD, FACS, MASCRS Esophageal, Gastrointestinal & Colorectal Surgery Robotic and Minimally Invasive Surgery  [facility] [address], [address] [place], [place] [address] [phone] Fax [phone] Main  CONTACT INFORMATION: Weekday (9AM-5PM): Call [facility] main office at [phone] Weeknight (5PM-9AM) or Weekend/Holiday: Check EPIC "Web Links" tab & use "AMION" (password " [email or web address]") for General Surgery [facility] coverage  Please, DO NOT use SecureChat  (it is not reliable communication to reach operating surgeons  & will lead to a delay in care).   Epic staff messaging available for outptient concerns needing 1-2 business day response.      06/02/2023  9:23 AM

## 2023-06-03 DIAGNOSIS — E440 Moderate protein-calorie malnutrition: Secondary | ICD-10-CM | POA: Insufficient documentation

## 2023-06-03 DIAGNOSIS — K632 Fistula of intestine: Secondary | ICD-10-CM | POA: Diagnosis not present

## 2023-06-03 LAB — CBC
HCT: 27.9 % — ABNORMAL LOW (ref 36.0–46.0)
Hemoglobin: 8.8 g/dL — ABNORMAL LOW (ref 12.0–15.0)
MCH: 26.9 pg (ref 26.0–34.0)
MCHC: 31.5 g/dL (ref 30.0–36.0)
MCV: 85.3 fL (ref 80.0–100.0)
Platelets: 280 10*3/uL (ref 150–400)
RBC: 3.27 MIL/uL — ABNORMAL LOW (ref 3.87–5.11)
RDW: 15.7 % — ABNORMAL HIGH (ref 11.5–15.5)
WBC: 14.7 10*3/uL — ABNORMAL HIGH (ref 4.0–10.5)
nRBC: 0 % (ref 0.0–0.2)

## 2023-06-03 LAB — POTASSIUM: Potassium: 3.7 mmol/L (ref 3.5–5.1)

## 2023-06-03 LAB — BASIC METABOLIC PANEL
Anion gap: 8 (ref 5–15)
BUN: 33 mg/dL — ABNORMAL HIGH (ref 8–23)
CO2: 20 mmol/L — ABNORMAL LOW (ref 22–32)
Calcium: 8.3 mg/dL — ABNORMAL LOW (ref 8.9–10.3)
Chloride: 108 mmol/L (ref 98–111)
Creatinine, Ser: 1.2 mg/dL — ABNORMAL HIGH (ref 0.44–1.00)
GFR, Estimated: 49 mL/min — ABNORMAL LOW (ref >60)
Glucose, Bld: 143 mg/dL — ABNORMAL HIGH (ref 70–99)
Potassium: 3.6 mmol/L (ref 3.5–5.1)
Sodium: 136 mmol/L (ref 135–145)

## 2023-06-03 LAB — GLUCOSE, CAPILLARY
Glucose-Capillary: 106 mg/dL — ABNORMAL HIGH (ref 70–99)
Glucose-Capillary: 137 mg/dL — ABNORMAL HIGH (ref 70–99)
Glucose-Capillary: 116 mg/dL — ABNORMAL HIGH (ref 70–99)
Glucose-Capillary: 197 mg/dL — ABNORMAL HIGH (ref 70–99)

## 2023-06-03 LAB — PHOSPHORUS: Phosphorus: 1.7 mg/dL — ABNORMAL LOW (ref 2.5–4.6)

## 2023-06-03 LAB — PROTIME-INR
INR: 1.2 (ref 0.8–1.2)
Prothrombin Time: 15.6 s — ABNORMAL HIGH (ref 11.4–15.2)

## 2023-06-03 LAB — PREALBUMIN: Prealbumin: 12 mg/dL — ABNORMAL LOW (ref 18–38)

## 2023-06-03 MED ORDER — LOPERAMIDE HCL 2 MG PO CAPS
2.0000 mg | ORAL_CAPSULE | Freq: Two times a day (BID) | ORAL | Status: AC | PRN
  Administered 2023-06-03: 2 mg via ORAL
  Filled 2023-06-03: qty 1

## 2023-06-03 MED ORDER — ENOXAPARIN SODIUM 40 MG/0.4ML IJ SOSY
40.0000 mg | PREFILLED_SYRINGE | INTRAMUSCULAR | Status: DC
  Administered 2023-06-04 – 2023-06-05 (×2): 40 mg via SUBCUTANEOUS
  Filled 2023-06-03 (×2): qty 0.4

## 2023-06-03 MED ORDER — FERROUS SULFATE 325 (65 FE) MG PO TABS
325.0000 mg | ORAL_TABLET | Freq: Two times a day (BID) | ORAL | Status: DC
  Administered 2023-06-03 – 2023-06-05 (×4): 325 mg via ORAL
  Filled 2023-06-03 (×4): qty 1

## 2023-06-03 MED ORDER — HYDROCODONE-ACETAMINOPHEN 5-325 MG PO TABS
1.0000 | ORAL_TABLET | ORAL | Status: DC | PRN
Start: 2023-06-03 — End: 2023-06-05
  Administered 2023-06-03 – 2023-06-05 (×5): 1 via ORAL
  Filled 2023-06-03 (×6): qty 1

## 2023-06-03 MED ORDER — LOPERAMIDE HCL 2 MG PO CAPS
2.0000 mg | ORAL_CAPSULE | Freq: Once | ORAL | Status: AC
  Administered 2023-06-03: 2 mg via ORAL
  Filled 2023-06-03: qty 1

## 2023-06-03 MED ORDER — ACETAMINOPHEN 500 MG PO TABS
500.0000 mg | ORAL_TABLET | Freq: Four times a day (QID) | ORAL | Status: DC
Start: 2023-06-03 — End: 2023-06-07
  Administered 2023-06-03 – 2023-06-05 (×8): 500 mg via ORAL
  Filled 2023-06-03 (×9): qty 1

## 2023-06-03 MED ORDER — POTASSIUM CHLORIDE CRYS ER 20 MEQ PO TBCR
40.0000 meq | EXTENDED_RELEASE_TABLET | Freq: Once | ORAL | Status: AC
  Administered 2023-06-03: 40 meq via ORAL
  Filled 2023-06-03: qty 2

## 2023-06-03 NOTE — Progress Notes (Signed)
[facility] Progress Note Patient: Jillian Montgomery KZS:[id number] DOB: 1956/02/09 DOA: 05/31/2023  DOS: the patient was seen and examined on 06/03/2023  Brief hospital course: PMH of CKD stage III A, HLD, HTN, type II DM, colocutaneous fistula SP robotic laparoscopic low anterior resection. Medicine service was consulted for AKI and hyponatremia.  Assessment and Plan: Acute kidney injury on CKD 3A. Renal function improving with blood transfusion Continue to hold medication  Postop acute blood loss anemia. Has history of iron deficiency anemia. Hemoglobin was low. SP 1 PRBC transfusion with improvement in H&H as well as blood pressure and renal dysfunction. Monitor for  Hyponatremia. Diuretics on hold. Currently improved. Monitor.  Type of diabetes mellitus. Continue to be on carb modified diet.  Neuropathy. Continue gabapentin.  Colocutaneous fistula. SP low anterior resection. Management per surgery.  Subjective: Abdominal pain still present.  No nausea no vomiting.  Had multiple BMs.  Physical Exam: General: in Mild distress, No Rash Cardiovascular: S1 and S2 Present, No Murmur Respiratory: Good respiratory effort, Bilateral Air entry present. No Crackles, No wheezes Abdomen: Bowel Sound present, mild diffuse tenderness Extremities: No edema Neuro: Alert and oriented x3, no new focal deficit  Data Reviewed: I have Reviewed nursing notes, Vitals, and Lab results. Since last encounter, pertinent lab results CBC and BMP   . I have ordered test including CBC and BMP  .   Disposition: Status is: Inpatient Remains inpatient appropriate because: Monitor for improvement in abdominal pain  enoxaparin (LOVENOX) injection 40 mg Start: 06/04/23 0800 SCD's Start: 05/31/23 1713   Family Communication: No one at bedside Level of care: Med-Surg   Vitals:   06/02/23 2116 06/03/23 0500 06/03/23 0550 06/03/23 1308  BP: (!) 107/55  (!) 107/53 (!) 116/59  Pulse: 71  75  73  Resp: 18  18 18   Temp: 98.4 F (36.9 C)  98.3 F (36.8 C) 98.1 F (36.7 C)  TempSrc: Oral  Oral   SpO2: 99%  100% 100%  Weight:  63.9 kg    Height:         Author: Lynden Oxford, MD 06/03/2023 7:03 PM  Please look on [email or web address] to find out who is on call.

## 2023-06-03 NOTE — Progress Notes (Addendum)
06/03/2023  Jillian Montgomery [id number] 01-Sep-1955  CARE TEAM: PCP: Merri Brunette, MD  Outpatient Care Team: Patient Care Team: Merri Brunette, MD as PCP - General (Family Medicine)  Inpatient Treatment Team: Treatment Team:  Romie Levee, MD Lilyan Gilford, MD Carmina Miller, PT Merwyn Katos, NT Deveron Furlong, RN Sherian Maroon, RN Ewing Schlein, LCSW [place], Jacqulyn Cane, [facility]   Problem List:   Principal Problem:   Colocutaneous fistula Active Problems:   CKD (chronic kidney disease) stage 3, GFR 30-59 ml/min (HCC)   Diverticulitis of large intestine with abscess   HTN (hypertension)   Type 2 diabetes mellitus treated with insulin (HCC)   Hyponatremia   IDA (iron deficiency anemia)   AKI (acute kidney injury) (HCC)   05/31/2023  POST-OPERATIVE DIAGNOSIS:  DIVERTICULAR DISEASE COLOCUTANEOUS FISTULA   PROCEDURE:   ROBOTIC LOW ANTERIOR RESECTION SPLENIC FLEXURE MOBILIZATION INTRAOPERATIVE ASSESSMENT OF PERFUSION USING ICG DYE   Surgeon:  Romie Levee, MD  OR FINDINGS:    Patient had significant left pelvic sidewall adhesions with fistula to an abscess cavity within the left lower quadrant subcutaneous tissue.   The anastomosis rests 10 cm from the anal verge by rigid proctoscopy.  Assessment [facility] Stay = 3 days) 3 Days Post-Op    Gradually improving.  OK    Plan:  ERAS pathway.  Follow-up on pathology.  Abscess and left percutaneous drain in panniculus status post interested in drainage and packing.  Continue packing twice daily and then most likely transition to daily backing home.  She has no one to help her.  Will see if we get home health to do daily packing to let this close up.  Multimodal pain control.  Continue scheduled Tylenol.  Increase gabapentin.  Patient claims she got really loopy and confused with oxycodone.  Will switch to hydrocodone instead.  Ileus resolved.  Fiber regimen.  With persistent diarrhea give 1  dose of loperamide and can have low-dose PRN (as needed) from the next day or so..  Try not to be too aggressive.  Should self resolve over time.    Blood pressure has been rather soft.  No severe anemia.  Holding medications for now.  Acute postoperative anemia in the setting of anemia chronic disease.  Transfused 1 unit yesterday.  Seems to be stabilizing.  Add oral iron which should help constipate as well.  Follow-up.  Chronic kidney disease with bump up in creatinine not too surprising.  Normalizing.  Try and keep on the dry side but have PRN (as needed) IV fluids just in case. Hyponatremia.  Try and keep on the dry side and follow.  Hyponatremia perhaps related to the chlorthalidone diuretic in her home meds.  Being held and followed.  Seems to be more normal today.  Internal medicine help appreciated  Electrolytes.  Try and keep potassium above 4 and magnesium above 2.  VTE prophylaxis- SCDs, etc  -mobilize as tolerated to help recovery.  To get up in the hallways when he send she is feeling rather tired and rundown.  Have therapies evaluate to see if she would benefit from home health.  Suspect she will need at least home health physical therapy.  See what therapies think.  Possible need for SNF but trying to hold off since she is starting to mobilize.    -Disposition:  Disposition:  The patient is from: Home Anticipate discharge to:  Home with Home Health Anticipated Date of Discharge is:  November 19,2024   Barriers  to discharge:  Consultant clearance & sign off  , Testing result pending, Therapy assessment & Recommendations pending, and Pending Clinical improvement (more likely than not)  Patient currently is NOT MEDICALLY STABLE for discharge from the hospital from a surgery standpoint.      I reviewed nursing notes, last 24 h vitals and pain scores, last 48 h intake and output, last 24 h labs and trends, and last 24 h imaging results.  I have reviewed this patient's  available data, including medical history, events of note, test results, etc as part of my evaluation.   A significant portion of that time was spent in counseling. Care during the described time interval was provided by me.  This care required high  level of medical decision making.  06/03/2023    Subjective: (Chief complaint)  Transfused.  Having loose bowel movements and diarrhea.  Felt rather loopy on the oxycodone.  Wondering if she can get something else.  Did get out of bed and walk a little bit.  Objective:  Vital signs:  Vitals:   06/02/23 1730 06/02/23 2116 06/03/23 0500 06/03/23 0550  BP: (!) 128/56 (!) 107/55  (!) 107/53  Pulse: 74 71  75  Resp: 18 18  18   Temp: 98.4 F (36.9 C) 98.4 F (36.9 C)  98.3 F (36.8 C)  TempSrc: Oral Oral  Oral  SpO2: 100% 99%  100%  Weight:   63.9 kg   Height:        Last BM Date : 06/01/23  Intake/Output   Yesterday:  11/16 0701 - 11/17 0700 In: 1077.5 [P.O.:630; I.V.:105; Blood:342.5] Out: -  This shift:  Total I/O In: 360 [P.O.:360] Out: -   Bowel function:  Flatus: YES  BM:  YES  Drain: (No drain)   Physical Exam:  General: Pt awake/alert in no acute distress.   Not toxic but tired. Eyes: PERRL, normal EOM.  Sclera clear.  No icterus Neuro: CN II-XII intact w/o focal sensory/motor deficits. Lymph: No head/neck/groin lymphadenopathy Psych:  No delerium/psychosis/paranoia.  Oriented x 4 HENT: Normocephalic, Mucus membranes moist.  No thrush Neck: Supple, No tracheal deviation.  No obvious thyromegaly Chest: No pain to chest wall compression.  Good respiratory excursion.  No audible wheezing CV:  Pulses intact.  Regular rhythm.  No major extremity edema MS: Normal AROM mjr joints.  No obvious deformity  Abdomen: Soft.  Nondistended.  Tenderness at midline incision and chronic left flank drain.  No evidence of peritonitis.  No incarcerated hernias. Left flank drain wound clean.   Ext:   No deformity.  No  mjr edema.  No cyanosis Skin: No petechiae / purpurea.  No major sores.  Warm and dry    Results:   Cultures: No results found for this or any previous visit (from the past 720 hour(s)).  Labs: Results for orders placed or performed during the hospital encounter of 05/31/23 (from the past 48 hour(s))  Glucose, capillary     Status: Abnormal   Collection Time: 06/01/23 11:31 AM  Result Value Ref Range   Glucose-Capillary 201 (H) 70 - 99 mg/dL    Comment: Glucose reference range applies only to samples taken after fasting for at least 8 hours.  Glucose, capillary     Status: None   Collection Time: 06/01/23  4:48 PM  Result Value Ref Range   Glucose-Capillary 89 70 - 99 mg/dL    Comment: Glucose reference range applies only to samples taken after fasting for at least 8  hours.  Glucose, capillary     Status: Abnormal   Collection Time: 06/01/23  8:58 PM  Result Value Ref Range   Glucose-Capillary 179 (H) 70 - 99 mg/dL    Comment: Glucose reference range applies only to samples taken after fasting for at least 8 hours.  CBC     Status: Abnormal   Collection Time: 06/02/23  5:50 AM  Result Value Ref Range   WBC 12.3 (H) 4.0 - 10.5 K/uL   RBC 2.93 (L) 3.87 - 5.11 MIL/uL   Hemoglobin 7.6 (L) 12.0 - 15.0 g/dL   HCT 42.7 (L) 06.2 - 37.6 %   MCV 82.9 80.0 - 100.0 fL   MCH 25.9 (L) 26.0 - 34.0 pg   MCHC 31.3 30.0 - 36.0 g/dL   RDW 28.3 15.1 - 76.1 %   Platelets 262 150 - 400 K/uL   nRBC 0.0 0.0 - 0.2 %    Comment: Performed at [facility], [address], [place], [place] [address]  Basic metabolic panel     Status: Abnormal   Collection Time: 06/02/23  5:50 AM  Result Value Ref Range   Sodium 125 (L) 135 - 145 mmol/L   Potassium 3.9 3.5 - 5.1 mmol/L   Chloride 99 98 - 111 mmol/L   CO2 19 (L) 22 - 32 mmol/L   Glucose, Bld 200 (H) 70 - 99 mg/dL    Comment: Glucose reference range applies only to samples taken after fasting for at least 8 hours.   BUN 40 (H) 8  - 23 mg/dL   Creatinine, Ser 1.06 (H) 0.44 - 1.00 mg/dL   Calcium 7.8 (L) 8.9 - 10.3 mg/dL   GFR, Estimated 35 (L) >60 mL/min    Comment: (NOTE) Calculated using the CKD-EPI Creatinine Equation (2021)    Anion gap 7 5 - 15    Comment: Performed at [facility], [address] [place], [place] [address]  Vitamin B12     Status: None   Collection Time: 06/02/23  5:50 AM  Result Value Ref Range   Vitamin B-12 468 180 - 914 pg/mL    Comment: (NOTE) This assay is not validated for testing neonatal or myeloproliferative syndrome specimens for Vitamin B12 levels. Performed at [facility], [address], [place], [place] [address]   Folate     Status: None   Collection Time: 06/02/23  5:50 AM  Result Value Ref Range   Folate 6.9 >5.9 ng/mL    Comment: Performed at [facility], [address] [place], [place] [address]  Iron and TIBC     Status: Abnormal   Collection Time: 06/02/23  5:50 AM  Result Value Ref Range   Iron 10 (L) 28 - 170 ug/dL   TIBC 938 (L) 182 - 993 ug/dL   Saturation Ratios 4 (L) 10.4 - 31.8 %   UIBC 222 ug/dL    Comment: Performed at [facility], [address] [place], [place] [address]  Ferritin     Status: Abnormal   Collection Time: 06/02/23  5:50 AM  Result Value Ref Range   Ferritin 342 (H) 11 - 307 ng/mL    Comment: Performed at [facility], [address], [place], [place] [address]  Reticulocytes     Status: Abnormal   Collection Time: 06/02/23  5:50 AM  Result Value Ref Range   Retic Ct Pct 1.7 0.4 - 3.1 %   RBC. 2.85 (  L) 3.87 - 5.11 MIL/uL   Retic Count, Absolute 47.3 19.0 - 186.0 K/uL   Immature Retic Fract 11.1 2.3 - 15.9 %    Comment: Performed at [facility], [address], [place], [place] [address]  Glucose, capillary     Status: Abnormal   Collection Time: 06/02/23  7:28 AM  Result Value Ref Range   Glucose-Capillary  197 (H) 70 - 99 mg/dL    Comment: Glucose reference range applies only to samples taken after fasting for at least 8 hours.   Comment 1 Notify RN   Glucose, capillary     Status: Abnormal   Collection Time: 06/02/23 11:32 AM  Result Value Ref Range   Glucose-Capillary 188 (H) 70 - 99 mg/dL    Comment: Glucose reference range applies only to samples taken after fasting for at least 8 hours.   Comment 1 Notify RN   Type and screen [facility]     Status: None (Preliminary result)   Collection Time: 06/02/23 12:04 PM  Result Value Ref Range   ABO/RH(D) O POS    Antibody Screen NEG    Sample Expiration 06/05/2023,2359    Unit Number [id number]    Blood Component Type RED CELLS,LR    Unit division 00    Status of Unit ISSUED    Transfusion Status OK TO TRANSFUSE    Crossmatch Result      Compatible Performed at [facility], [address], [place], [place] [address]   Prepare RBC (crossmatch)     Status: None   Collection Time: 06/02/23 12:04 PM  Result Value Ref Range   Order Confirmation      ORDER PROCESSED BY BLOOD BANK Performed at [facility], [address] [place], [place] [address]   Glucose, capillary     Status: Abnormal   Collection Time: 06/02/23  4:40 PM  Result Value Ref Range   Glucose-Capillary 121 (H) 70 - 99 mg/dL    Comment: Glucose reference range applies only to samples taken after fasting for at least 8 hours.   Comment 1 Notify RN   Glucose, capillary     Status: Abnormal   Collection Time: 06/02/23  9:17 PM  Result Value Ref Range   Glucose-Capillary 115 (H) 70 - 99 mg/dL    Comment: Glucose reference range applies only to samples taken after fasting for at least 8 hours.  Prealbumin     Status: Abnormal   Collection Time: 06/02/23 11:29 PM  Result Value Ref Range   Prealbumin 12 (L) 18 - 38 mg/dL    Comment: Performed at [facility], [address], [place], [place] [address]  CBC      Status: Abnormal   Collection Time: 06/03/23  5:15 AM  Result Value Ref Range   WBC 14.7 (H) 4.0 - 10.5 K/uL   RBC 3.27 (L) 3.87 - 5.11 MIL/uL   Hemoglobin 8.8 (L) 12.0 - 15.0 g/dL   HCT 46.9 (L) 62.9 - 52.8 %   MCV 85.3 80.0 - 100.0 fL   MCH 26.9 26.0 - 34.0 pg   MCHC 31.5 30.0 - 36.0 g/dL   RDW 41.3 (H) 24.4 - 01.0 %   Platelets 280 150 - 400 K/uL   nRBC 0.0 0.0 - 0.2 %    Comment: Performed at [facility], [address] [place], [place] [address]  Basic metabolic panel     Status: Abnormal  Collection Time: 06/03/23  5:15 AM  Result Value Ref Range   Sodium 136 135 - 145 mmol/L    Comment: DELTA CHECK NOTED   Potassium 3.6 3.5 - 5.1 mmol/L   Chloride 108 98 - 111 mmol/L   CO2 20 (L) 22 - 32 mmol/L   Glucose, Bld 143 (H) 70 - 99 mg/dL    Comment: Glucose reference range applies only to samples taken after fasting for at least 8 hours.   BUN 33 (H) 8 - 23 mg/dL   Creatinine, Ser 1.19 (H) 0.44 - 1.00 mg/dL   Calcium 8.3 (L) 8.9 - 10.3 mg/dL   GFR, Estimated 49 (L) >60 mL/min    Comment: (NOTE) Calculated using the CKD-EPI Creatinine Equation (2021)    Anion gap 8 5 - 15    Comment: Performed at [facility], [address] [place], [place] [address]  Protime-INR     Status: Abnormal   Collection Time: 06/03/23  5:15 AM  Result Value Ref Range   Prothrombin Time 15.6 (H) 11.4 - 15.2 seconds   INR 1.2 0.8 - 1.2    Comment: (NOTE) INR goal varies based on device and disease states. Performed at [facility], [address], [place], [place] [address]   Glucose, capillary     Status: Abnormal   Collection Time: 06/03/23  7:36 AM  Result Value Ref Range   Glucose-Capillary 106 (H) 70 - 99 mg/dL    Comment: Glucose reference range applies only to samples taken after fasting for at least 8 hours.    Imaging / Studies: No results found.  Medications / Allergies: per chart  Antibiotics: Anti-infectives  (From admission, onward)    Start     Dose/Rate Route Frequency Ordered Stop   06/01/23 0000  cefoTEtan (CEFOTAN) 2 g in sodium chloride 0.9 % 100 mL IVPB        2 g 200 mL/hr over 30 Minutes Intravenous Every 12 hours 05/31/23 1712 06/01/23 0143   05/31/23 1045  cefoTEtan (CEFOTAN) 2 g in sodium chloride 0.9 % 100 mL IVPB        2 g 200 mL/hr over 30 Minutes Intravenous On call to O.R. 05/31/23 1031 05/31/23 1246         Note: Portions of this report may have been transcribed using voice recognition software. Every effort was made to ensure accuracy; however, inadvertent computerized transcription errors may be present.   Any transcriptional errors that result from this process are unintentional.    Ardeth Sportsman, MD, FACS, MASCRS Esophageal, Gastrointestinal & Colorectal Surgery Robotic and Minimally Invasive Surgery  [facility] [address], [address] [place], [place] [address] [phone] Fax [phone] Main  CONTACT INFORMATION: Weekday (9AM-5PM): Call [facility] main office at [phone] Weeknight (5PM-9AM) or Weekend/Holiday: Check EPIC "Web Links" tab & use "AMION" (password " [email or web address]") for General Surgery [facility] coverage  Please, DO NOT use SecureChat  (it is not reliable communication to reach operating surgeons & will lead to a delay in care).   Epic staff messaging available for outptient concerns needing 1-2 business day response.      06/03/2023  10:11 AM

## 2023-06-03 NOTE — Evaluation (Signed)
Physical Therapy Evaluation Patient Details Name: Jillian Montgomery MRN: [id number] DOB: 1956/03/24 Today's Date: 06/03/2023  History of Present Illness  66 yo female admitted with colocutaneous fistula, hyponatremia. S/P lap LAR 11/14. Hx of CKD, DM, pancreatitis, diverticulitis, fistula.  Clinical Impression  On eval, pt was CGA for mobility. She walked ~200 feet around the unit-slowly with intermittent unsteadiness. Discussed d/c plan-pt reports she will need help with dressing changes-recommend TOC consult. Will plan to follow pt during hospital stay. Recommend daily ambulation in hallway as able. Recommend HHRN, HHPT f/u.         If plan is discharge home, recommend the following: Assistance with cooking/housework;Assist for transportation;Help with stairs or ramp for entrance   Can travel by private vehicle        Equipment Recommendations None recommended by PT  Recommendations for Other Services       Functional Status Assessment Patient has had a recent decline in their functional status and demonstrates the ability to make significant improvements in function in a reasonable and predictable amount of time.     Precautions / Restrictions Precautions Precaution Comments: abd sg Restrictions Weight Bearing Restrictions: No      Mobility  Bed Mobility Overal bed mobility: Needs Assistance Bed Mobility: Rolling, Sidelying to Sit Rolling: Modified independent (Device/Increase time) Sidelying to sit: HOB elevated, Used rails, Modified independent (Device/Increase time)       General bed mobility comments: increased time    Transfers Overall transfer level: Needs assistance   Transfers: Sit to/from Stand Sit to Stand: Modified independent (Device/Increase time)           General transfer comment: increased time.    Ambulation/Gait Ambulation/Gait assistance: Contact guard assist Gait Distance (Feet): 200 Feet Assistive device: None Gait  Pattern/deviations: Step-through pattern, Decreased stride length       General Gait Details: slow gait speed. intermittent unsteadiness. no overt lob. some nausea  Stairs            Wheelchair Mobility     Tilt Bed    Modified Rankin (Stroke Patients Only)       Balance Overall balance assessment: Mild deficits observed, not formally tested                                           Pertinent Vitals/Pain Pain Assessment Pain Assessment: Faces Faces Pain Scale: Hurts even more Pain Location: abdomen Pain Descriptors / Indicators: Operative site guarding Pain Intervention(s): Limited activity within patient's tolerance, Monitored during session, Repositioned    Home Living Family/patient expects to be discharged to:: Private residence Living Arrangements:  (roommate) Available Help at Discharge: Available PRN/intermittently Type of Home: House Home Access: Stairs to enter Entrance Stairs-Rails: Right Entrance Stairs-Number of Steps: 7   Home Layout: One level Home Equipment: None      Prior Function Prior Level of Function : Independent/Modified Independent                     Extremity/Trunk Assessment   Upper Extremity Assessment Upper Extremity Assessment: Overall WFL for tasks assessed    Lower Extremity Assessment Lower Extremity Assessment: Overall WFL for tasks assessed    Cervical / Trunk Assessment Cervical / Trunk Assessment: Normal  Communication   Communication Communication: No apparent difficulties  Cognition Arousal: Alert Behavior During Therapy: WFL for tasks assessed/performed Overall Cognitive Status: Within Functional Limits  for tasks assessed                                          General Comments      Exercises     Assessment/Plan    PT Assessment Patient needs continued PT services  PT Problem List Decreased strength;Decreased range of motion;Decreased activity  tolerance;Decreased balance;Decreased mobility;Decreased knowledge of use of DME;Pain       PT Treatment Interventions DME instruction;Gait training;Functional mobility training;Therapeutic activities;Therapeutic exercise;Patient/family education;Balance training    PT Goals (Current goals can be found in the Care Plan section)  Acute Rehab PT Goals Patient Stated Goal: less pain PT Goal Formulation: With patient Time For Goal Achievement: 06/17/23 Potential to Achieve Goals: Good    Frequency Min 1X/week     Co-evaluation               AM-PAC PT "6 Clicks" Mobility  Outcome Measure Help needed turning from your back to your side while in a flat bed without using bedrails?: None Help needed moving from lying on your back to sitting on the side of a flat bed without using bedrails?: A Little Help needed moving to and from a bed to a chair (including a wheelchair)?: None Help needed standing up from a chair using your arms (e.g., wheelchair or bedside chair)?: None Help needed to walk in hospital room?: A Little Help needed climbing 3-5 steps with a railing? : A Little 6 Click Score: 21    End of Session   Activity Tolerance: Patient tolerated treatment well;Patient limited by pain Patient left: in chair;with call bell/phone within reach;with nursing/sitter in room   PT Visit Diagnosis: Pain;Other abnormalities of gait and mobility (R26.89) Pain - part of body:  (abdomen)    Time: 6045-4098 PT Time Calculation (min) (ACUTE ONLY): 27 min   Charges:   PT Evaluation $PT Eval Low Complexity: 1 Low PT Treatments $Gait Training: 8-22 mins PT General Charges $$ ACUTE PT VISIT: 1 Visit           Faye Ramsay, PT Acute Rehabilitation  Office: [phone]

## 2023-06-03 NOTE — Plan of Care (Signed)
  Problem: Education: Goal: Understanding of discharge needs will improve Outcome: Progressing Goal: Verbalization of understanding of the causes of altered bowel function will improve Outcome: Progressing   Problem: Activity: Goal: Ability to tolerate increased activity will improve Outcome: Progressing   Problem: Bowel/Gastric: Goal: Gastrointestinal status for postoperative course will improve Outcome: Progressing

## 2023-06-03 NOTE — Plan of Care (Signed)
  Problem: Education: Goal: Understanding of discharge needs will improve Outcome: Progressing Goal: Verbalization of understanding of the causes of altered bowel function will improve Outcome: Progressing   Problem: Activity: Goal: Ability to tolerate increased activity will improve Outcome: Progressing   Problem: Bowel/Gastric: Goal: Gastrointestinal status for postoperative course will improve Outcome: Progressing   Problem: Health Behavior/Discharge Planning: Goal: Identification of community resources to assist with postoperative recovery needs will improve Outcome: Progressing   Problem: Nutritional: Goal: Will attain and maintain optimal nutritional status will improve Outcome: Progressing   Problem: Clinical Measurements: Goal: Postoperative complications will be avoided or minimized Outcome: Progressing   Problem: Respiratory: Goal: Respiratory status will improve Outcome: Progressing   Problem: Skin Integrity: Goal: Will show signs of wound healing Outcome: Progressing   Problem: Education: Goal: Knowledge of General Education information will improve Description: Including pain rating scale, medication(s)/side effects and non-pharmacologic comfort measures Outcome: Progressing   Problem: Health Behavior/Discharge Planning: Goal: Ability to manage health-related needs will improve Outcome: Progressing   Problem: Clinical Measurements: Goal: Ability to maintain clinical measurements within normal limits will improve Outcome: Progressing Goal: Will remain free from infection Outcome: Progressing Goal: Diagnostic test results will improve Outcome: Progressing Goal: Respiratory complications will improve Outcome: Progressing Goal: Cardiovascular complication will be avoided Outcome: Progressing   Problem: Activity: Goal: Risk for activity intolerance will decrease Outcome: Progressing   Problem: Nutrition: Goal: Adequate nutrition will be  maintained Outcome: Progressing   Problem: Coping: Goal: Level of anxiety will decrease Outcome: Progressing   Problem: Elimination: Goal: Will not experience complications related to bowel motility Outcome: Progressing Goal: Will not experience complications related to urinary retention Outcome: Progressing   Problem: Pain Management: Goal: General experience of comfort will improve Outcome: Progressing   Problem: Safety: Goal: Ability to remain free from injury will improve Outcome: Progressing   Problem: Skin Integrity: Goal: Risk for impaired skin integrity will decrease Outcome: Progressing   Problem: Education: Goal: Ability to describe self-care measures that may prevent or decrease complications (Diabetes Survival Skills Education) will improve Outcome: Progressing Goal: Individualized Educational Video(s) Outcome: Progressing   Problem: Coping: Goal: Ability to adjust to condition or change in health will improve Outcome: Progressing   Problem: Fluid Volume: Goal: Ability to maintain a balanced intake and output will improve Outcome: Progressing   Problem: Health Behavior/Discharge Planning: Goal: Ability to identify and utilize available resources and services will improve Outcome: Progressing Goal: Ability to manage health-related needs will improve Outcome: Progressing   Problem: Metabolic: Goal: Ability to maintain appropriate glucose levels will improve Outcome: Progressing   Problem: Nutritional: Goal: Maintenance of adequate nutrition will improve Outcome: Progressing Goal: Progress toward achieving an optimal weight will improve Outcome: Progressing   Problem: Skin Integrity: Goal: Risk for impaired skin integrity will decrease Outcome: Progressing   Problem: Tissue Perfusion: Goal: Adequacy of tissue perfusion will improve Outcome: Progressing

## 2023-06-04 DIAGNOSIS — K632 Fistula of intestine: Secondary | ICD-10-CM | POA: Diagnosis not present

## 2023-06-04 LAB — GLUCOSE, CAPILLARY
Glucose-Capillary: 175 mg/dL — ABNORMAL HIGH (ref 70–99)
Glucose-Capillary: 180 mg/dL — ABNORMAL HIGH (ref 70–99)
Glucose-Capillary: 204 mg/dL — ABNORMAL HIGH (ref 70–99)
Glucose-Capillary: 163 mg/dL — ABNORMAL HIGH (ref 70–99)

## 2023-06-04 LAB — TYPE AND SCREEN
ABO/RH(D): O POS
Antibody Screen: NEGATIVE
Unit division: 0

## 2023-06-04 LAB — BASIC METABOLIC PANEL
Anion gap: 7 (ref 5–15)
BUN: 24 mg/dL — ABNORMAL HIGH (ref 8–23)
CO2: 22 mmol/L (ref 22–32)
Calcium: 8.2 mg/dL — ABNORMAL LOW (ref 8.9–10.3)
Chloride: 106 mmol/L (ref 98–111)
Creatinine, Ser: 1.23 mg/dL — ABNORMAL HIGH (ref 0.44–1.00)
GFR, Estimated: 48 mL/min — ABNORMAL LOW (ref >60)
Glucose, Bld: 183 mg/dL — ABNORMAL HIGH (ref 70–99)
Potassium: 4.2 mmol/L (ref 3.5–5.1)
Sodium: 135 mmol/L (ref 135–145)

## 2023-06-04 LAB — CBC
HCT: 27.9 % — ABNORMAL LOW (ref 36.0–46.0)
Hemoglobin: 8.8 g/dL — ABNORMAL LOW (ref 12.0–15.0)
MCH: 26.9 pg (ref 26.0–34.0)
MCHC: 31.5 g/dL (ref 30.0–36.0)
MCV: 85.3 fL (ref 80.0–100.0)
Platelets: 295 10*3/uL (ref 150–400)
RBC: 3.27 MIL/uL — ABNORMAL LOW (ref 3.87–5.11)
RDW: 16.4 % — ABNORMAL HIGH (ref 11.5–15.5)
WBC: 13.3 10*3/uL — ABNORMAL HIGH (ref 4.0–10.5)
nRBC: 0 % (ref 0.0–0.2)

## 2023-06-04 LAB — PHOSPHORUS: Phosphorus: 1.6 mg/dL — ABNORMAL LOW (ref 2.5–4.6)

## 2023-06-04 LAB — BPAM RBC
Blood Product Expiration Date: 202411302359
ISSUE DATE / TIME: 202411161434
Unit Type and Rh: 5100

## 2023-06-04 LAB — SURGICAL PATHOLOGY

## 2023-06-04 MED ORDER — SODIUM PHOSPHATES 45 MMOLE/15ML IV SOLN
15.0000 mmol | Freq: Once | INTRAVENOUS | Status: DC
  Filled 2023-06-04: qty 5

## 2023-06-04 MED ORDER — POTASSIUM PHOSPHATES 15 MMOLE/5ML IV SOLN: 15.0000 mmol | Freq: Once | INTRAVENOUS | Status: DC

## 2023-06-04 MED ORDER — K PHOS MONO-SOD PHOS DI & MONO 155-852-130 MG PO TABS
500.0000 mg | ORAL_TABLET | Freq: Three times a day (TID) | ORAL | Status: DC
  Administered 2023-06-04 – 2023-06-05 (×3): 500 mg via ORAL
  Filled 2023-06-04 (×4): qty 2

## 2023-06-04 MED ORDER — BISMUTH SUBSALICYLATE 262 MG/15ML PO SUSP: 30.0000 mL | Freq: Three times a day (TID) | ORAL | Status: DC | PRN

## 2023-06-04 NOTE — Progress Notes (Signed)
Per Dr. Allena Katz, patient was encouraged to change dressing on lower abdomen herself tonight with nursing guidance. This nurse provided support and instructions on steps to change dressing. Patient was pre-medicated with [place] (see MAR) and was able to do >75% of packing with this nurse assisting with last part of packing to ensure proper amount packed. Patient able to clean, pack, and apply dressing with minimal assistance. Verbalizes looking into mirror is an awkward angle, but reports being confident to be able to change dressing at home. Dr. Allena Katz made aware.

## 2023-06-04 NOTE — TOC Initial Note (Signed)
Transition of Care [facility]) - Initial/Assessment Note   Patient Details  Name: Jillian Montgomery MRN: [id number] Date of Birth: 04-20-1956  Transition of Care [facility]) CM/SW Contact:    Ewing Schlein, LCSW Phone Number: 06/04/2023, 2:47 PM  Clinical Narrative: Patient will need HHPT and RN for wound care. Patient requested [facility] as she used the agency earlier this year. CSW made [facility] referral to [facility] with [facility], which was accepted. HHPT/RN orders placed yesterday by surgeon. CSW updated patient regarding HH being set up.                Expected Discharge Plan: Home w Home Health Services Barriers to Discharge: Continued Medical Work up  Patient Goals and CMS Choice Patient states their goals for this hospitalization and ongoing recovery are:: Discharge home with [facility] through [facility] CMS Medicare.gov Compare Post Acute Care list provided to:: Patient Choice offered to / list presented to : Patient  Expected Discharge Plan and Services In-house Referral: Clinical Social Work Post Acute Care Choice: Home Health Living arrangements for the past 2 months: Single Family Home           DME Arranged: N/A DME Agency: NA HH Arranged: PT, RN HH Agency: [facility] Date HH Agency Contacted: 06/04/23 Time HH Agency Contacted: 1435 Representative spoke with at [facility] Agency: Tresa Endo  Prior Living Arrangements/Services Living arrangements for the past 2 months: Single Family Home Patient language and need for interpreter reviewed:: Yes Do you feel safe going back to the place where you live?: Yes      Need for Family Participation in Patient Care: No (Comment) Care giver support system in place?: Yes (comment) Criminal Activity/Legal Involvement Pertinent to Current Situation/Hospitalization: No - Comment as needed  Activities of Daily Living ADL Screening (condition at time of admission) Independently performs ADLs?: Yes (appropriate for developmental age) Is the patient deaf or  have difficulty hearing?: No Does the patient have difficulty seeing, even when wearing glasses/contacts?: No Does the patient have difficulty concentrating, remembering, or making decisions?: No  Permission Sought/Granted Permission sought to share information with : Other (comment) Permission granted to share information with : Yes, Verbal Permission Granted Permission granted to share info w AGENCY: [facility]  Emotional Assessment Attitude/Demeanor/Rapport: Engaged Affect (typically observed): Accepting Orientation: : Oriented to Self, Oriented to Place, Oriented to  Time, Oriented to Situation Alcohol / Substance Use: Not Applicable Psych Involvement: No (comment)  Admission diagnosis:  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] Patient Active Problem List   Diagnosis Date Noted   Protein-calorie malnutrition, moderate (HCC) 06/03/2023   Hyponatremia 06/02/2023   IDA (iron deficiency anemia) 06/02/2023   AKI (acute kidney injury) (HCC) 06/02/2023   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 - [address] [place] [address] Phone: [phone] Fax: [phone]  Social Determinants of Health (SDOH) Social History: SDOH Screenings   Food Insecurity: No Food Insecurity (05/31/2023)  Housing: Low Risk  (05/31/2023)  Transportation Needs: No Transportation Needs (05/31/2023)  Utilities: Not At Risk (05/31/2023)  Social Connections: Unknown (04/04/2023)  Received from [facility]  Tobacco Use: Low Risk   (05/31/2023)   SDOH Interventions:    Readmission Risk Interventions     No data to display

## 2023-06-04 NOTE — Plan of Care (Signed)
  Problem: Activity: Goal: Ability to tolerate increased activity will improve Outcome: Progressing   Problem: Bowel/Gastric: Goal: Gastrointestinal status for postoperative course will improve Outcome: Progressing   Problem: Clinical Measurements: Goal: Postoperative complications will be avoided or minimized Outcome: Progressing   Problem: Education: Goal: Knowledge of General Education information will improve Description: Including pain rating scale, medication(s)/side effects and non-pharmacologic comfort measures Outcome: Progressing   Problem: Coping: Goal: Level of anxiety will decrease Outcome: Progressing   Problem: Elimination: Goal: Will not experience complications related to bowel motility Outcome: Progressing Goal: Will not experience complications related to urinary retention Outcome: Progressing   Problem: Pain Management: Goal: General experience of comfort will improve Outcome: Progressing   Problem: Safety: Goal: Ability to remain free from injury will improve Outcome: Progressing

## 2023-06-04 NOTE — Evaluation (Signed)
Occupational Therapy Evaluation Patient Details Name: Jillian Montgomery MRN: [id number] DOB: [birth year] Today's Date: 06/04/2023   History of Present Illness Patient is a 67 year old female admitted with colocutaneous fistula, hyponatremia. S/P lap LAR 11/14. patient was noted to have post op anemia and received 1 unit PRBCs.  Hx of CKD, DM, pancreatitis, diverticulitis, fistula.   Clinical Impression   Patient is a 67 year old female who was admitted for above. Patient was living at home independently with roommate. Patient was noted to be quick to fatigue during session with increased pain with movements. Patient was noted to have decreased functional activity tolerance, decreased endurance, decreased standing balance, decreased safety awareness, and decreased knowledge of AD/AE impacting participation in ADLs. Patient will need increased caregiver support at home. Patient reported having a sister she can call for more help. Patient would continue to benefit from skilled OT services at this time while admitted and after d/c to address noted deficits in order to improve overall safety and independence in ADLs.         If plan is discharge home, recommend the following: A little help with walking and/or transfers;A lot of help with bathing/dressing/bathroom;Assistance with cooking/housework;Assist for transportation;Direct supervision/assist for medications management;Direct supervision/assist for financial management;Help with stairs or ramp for entrance    Functional Status Assessment  Patient has had a recent decline in their functional status and demonstrates the ability to make significant improvements in function in a reasonable and predictable amount of time.  Equipment Recommendations  None recommended by OT       Precautions / Restrictions Precautions Precautions: Fall Precaution Comments: abdominal precautions Restrictions Weight Bearing Restrictions: No      Mobility Bed  Mobility Overal bed mobility: Needs Assistance     Sidelying to sit: HOB elevated, Used rails, Min assist       General bed mobility comments: with increased time and education on log rolling, min A needed to bring trunk upright sitting EOB          Balance Overall balance assessment: Mild deficits observed, not formally tested           ADL either performed or assessed with clinical judgement   ADL Overall ADL's : Needs assistance/impaired Eating/Feeding: Modified independent;Sitting   Grooming: Oral care;Sitting;Supervision/safety Grooming Details (indicate cue type and reason): in recliner Upper Body Bathing: Minimal assistance;Sitting   Lower Body Bathing: Maximal assistance;Sitting/lateral leans Lower Body Bathing Details (indicate cue type and reason): unable to bring LLE to lap can bring RLE up to lap for simulated task. patient declined to wash up at this time. Upper Body Dressing : Minimal assistance;Sitting   Lower Body Dressing: Maximal assistance;Sitting/lateral leans   Toilet Transfer: Minimal assistance;Ambulation;BSC/3in1 Toilet Transfer Details (indicate cue type and reason): in room with increased time. patient was able to engage in functional mobility from room 1317 to window at end of hallway and back with CGA with some unsteadiness noted. patient quick to fatigue during session. Toileting- Clothing Manipulation and Hygiene: Minimal assistance;Sit to/from stand;Sitting/lateral lean Toileting - Clothing Manipulation Details (indicate cue type and reason): needed CGA sitting on commode with BUE behind her attempting to participate in hygiene tasks.             Vision   Vision Assessment?: No apparent visual deficits            Pertinent Vitals/Pain Pain Assessment Pain Assessment: Faces Faces Pain Scale: Hurts even more Pain Location: abdomen Pain Descriptors / Indicators: Operative site  guarding Pain Intervention(s): Limited activity within  patient's tolerance, Monitored during session, Repositioned, Premedicated before session     Extremity/Trunk Assessment Upper Extremity Assessment Upper Extremity Assessment: Overall WFL for tasks assessed   Lower Extremity Assessment Lower Extremity Assessment: Defer to PT evaluation (unable to cross LLE but could bring RLE to lap)       Communication Communication Communication: No apparent difficulties   Cognition Arousal: Alert Behavior During Therapy: WFL for tasks assessed/performed Overall Cognitive Status: Within Functional Limits for tasks assessed         General Comments: plesant and cooperative. reported todays date was 19th                Home Living Family/patient expects to be discharged to:: Private residence Living Arrangements: Other (Comment) (roommate) Available Help at Discharge: Available PRN/intermittently Type of Home: House Home Access: Stairs to enter [employer] of Steps: 7 Entrance Stairs-Rails: Right Home Layout: One level               Home Equipment: None   Additional Comments: reported roommate goes to dialysis 3x a week, patient reported using door dash for meals at home      Prior Functioning/Environment Prior Level of Function : Independent/Modified Independent                        OT Problem List: Decreased activity tolerance;Impaired balance (sitting and/or standing);Decreased coordination;Decreased safety awareness;Decreased knowledge of precautions;Pain      OT Treatment/Interventions: Self-care/ADL training;Therapeutic exercise;DME and/or AE instruction;Therapeutic activities;Patient/family education;Balance training    OT Goals(Current goals can be found in the care plan section) Acute Rehab OT Goals Patient Stated Goal: to go home OT Goal Formulation: With patient Time For Goal Achievement: 06/18/23 Potential to Achieve Goals: Fair  OT Frequency: Min 1X/week       AM-PAC OT "6 Clicks"  Daily Activity     Outcome Measure Help from another person eating meals?: A Little Help from another person taking care of personal grooming?: A Little Help from another person toileting, which includes using toliet, bedpan, or urinal?: A Lot Help from another person bathing (including washing, rinsing, drying)?: A Lot Help from another person to put on and taking off regular upper body clothing?: A Little Help from another person to put on and taking off regular lower body clothing?: A Lot 6 Click Score: 15   End of Session Equipment Utilized During Treatment: Gait belt Nurse Communication: Mobility status  Activity Tolerance: Patient tolerated treatment well Patient left: in chair;with call bell/phone within reach  OT Visit Diagnosis: Unsteadiness on feet (R26.81);Other abnormalities of gait and mobility (R26.89);Muscle weakness (generalized) (M62.81);Pain                Time: 0800-0825 OT Time Calculation (min): 25 min Charges:  OT General Charges $OT Visit: 1 Visit OT Evaluation $OT Eval Low Complexity: 1 Low OT Treatments $Self Care/Home Management : 8-22 mins  Rosalio Loud, MS Acute Rehabilitation Department Office# [phone]   Selinda Flavin 06/04/2023, 8:38 AM

## 2023-06-04 NOTE — Progress Notes (Signed)
4 Days Post-Op Robotic LAR Subjective: Pain controlled, no nausea but belching a bit.  Having bowel function and tolerating a diet  Objective: Vital signs in last 24 hours: Temp:  [98.1 F (36.7 C)-99.5 F (37.5 C)] 99.5 F (37.5 C) (11/18 0529) Pulse Rate:  [73-87] 87 (11/18 0529) Resp:  [18] 18 (11/18 0529) BP: (114-117)/(55-61) 117/61 (11/18 0529) SpO2:  [97 %-100 %] 97 % (11/18 0529)   Intake/Output from previous day: 11/17 0701 - 11/18 0700 In: 1080 [P.O.:1080] Out: 0  Intake/Output this shift: No intake/output data recorded.   General appearance: alert and cooperative GI: normal findings: soft, non-distended  Incision: no significant drainage  Lab Results:  Recent Labs    06/03/23 0515 06/04/23 0459  WBC 14.7* 13.3*  HGB 8.8* 8.8*  HCT 27.9* 27.9*  PLT 280 295   BMET Recent Labs    06/03/23 0515 06/03/23 1054 06/04/23 0459  NA 136  --  135  K 3.6 3.7 4.2  CL 108  --  106  CO2 20*  --  22  GLUCOSE 143*  --  183*  BUN 33*  --  24*  CREATININE 1.20*  --  1.23*  CALCIUM 8.3*  --  8.2*   PT/INR Recent Labs    06/03/23 0515  LABPROT 15.6*  INR 1.2   ABG No results for input(s): "PHART", "HCO3" in the last 72 hours.  Invalid input(s): "PCO2", "PO2"  MEDS, Scheduled  acetaminophen  500 mg Oral Q6H   enoxaparin (LOVENOX) injection  40 mg Subcutaneous Q24H   feeding supplement  237 mL Oral BID BM   ferrous sulfate  325 mg Oral BID WC   gabapentin  300 mg Oral TID   insulin aspart  0-20 Units Subcutaneous TID WC   insulin aspart  0-5 Units Subcutaneous QHS   insulin aspart  3 Units Subcutaneous TID WC   polycarbophil  625 mg Oral BID    Studies/Results: No results found.  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

## 2023-06-04 NOTE — Progress Notes (Signed)
[facility] Progress Note Patient: Jillian Montgomery UXL:[id number] DOB: [birth year] DOA: 05/31/2023  DOS: the patient was seen and examined on 06/04/2023  Brief hospital course: PMH of CKD stage III A, HLD, HTN, type II DM, colocutaneous fistula SP robotic laparoscopic low anterior resection. Medicine service was consulted for AKI and hyponatremia.  Assessment and Plan: Acute kidney injury on CKD 3A. Baseline serum creatinine around 1.3 but recently prior to discharge serum creatinine was also 1.5.   On admission serum creatinine was 1.9 and currently improving to 1.23. Patient on losartan which I will continue to hold.  Postop acute blood loss anemia. Has history of iron deficiency anemia. Hemoglobin was low. SP 1 PRBC transfusion with improvement in H&H as well as blood pressure and renal dysfunction. Monitor  Hyponatremia. Diuretics on hold. Currently improved. Monitor.  Hypophosphatemia. Replacing orally.  Type of diabetes mellitus. Continue to be on carb modified diet.  Neuropathy. Continue gabapentin.  Colocutaneous fistula. SP robotic assisted laparoscopic low anterior resection on, splenic flexure mobilization and intraoperative assessment of perfusion. Management per surgery.  As the plan is to go home with home health RN for dressing changes and home health RN is not coming home on a daily basis generally, patient was encouraged to perform her own dressing to ensure that she can do it.  Thankfully successfully performed ability to do the dressing change.  Subjective: Abdomen still present.  No nausea no vomiting.  Physical Exam: Alert awake and oriented.  In good spirit.  No focal deficit.  Data Reviewed: I have Reviewed nursing notes, Vitals, and Lab results. Reviewed CBC and BMP.  Also reviewed phosphorus level.  Disposition: Status is: Inpatient Remains inpatient appropriate because: Monitor for improvement in abdominal pain  enoxaparin (LOVENOX)  injection 40 mg Start: 06/04/23 0800 SCD's Start: 05/31/23 1713   Family Communication: No one at bedside Level of care: Med-Surg   Vitals:   06/03/23 1308 06/03/23 1959 06/04/23 0529 06/04/23 1318  BP: (!) 116/59 (!) 114/55 117/61 (!) 121/53  Pulse: 73 76 87 77  Resp: 18 18 18 18   Temp: 98.1 F (36.7 C) 98.4 F (36.9 C) 99.5 F (37.5 C) 98.5 F (36.9 C)  TempSrc:  Oral Oral Oral  SpO2: 100% 100% 97% 100%  Weight:      Height:         Author: Lynden Oxford, MD 06/04/2023 7:22 PM  Please look on [email or web address] to find out who is on call.

## 2023-06-05 ENCOUNTER — Other Ambulatory Visit (HOSPITAL_COMMUNITY): Payer: Self-pay

## 2023-06-05 DIAGNOSIS — K632 Fistula of intestine: Secondary | ICD-10-CM | POA: Diagnosis not present

## 2023-06-05 LAB — BASIC METABOLIC PANEL
Anion gap: 11 (ref 5–15)
BUN: 25 mg/dL — ABNORMAL HIGH (ref 8–23)
CO2: 22 mmol/L (ref 22–32)
Calcium: 8.6 mg/dL — ABNORMAL LOW (ref 8.9–10.3)
Chloride: 103 mmol/L (ref 98–111)
Creatinine, Ser: 1.11 mg/dL — ABNORMAL HIGH (ref 0.44–1.00)
GFR, Estimated: 54 mL/min — ABNORMAL LOW (ref >60)
Glucose, Bld: 182 mg/dL — ABNORMAL HIGH (ref 70–99)
Potassium: 3.9 mmol/L (ref 3.5–5.1)
Sodium: 136 mmol/L (ref 135–145)

## 2023-06-05 LAB — CBC
HCT: 28.7 % — ABNORMAL LOW (ref 36.0–46.0)
Hemoglobin: 9.1 g/dL — ABNORMAL LOW (ref 12.0–15.0)
MCH: 27.2 pg (ref 26.0–34.0)
MCHC: 31.7 g/dL (ref 30.0–36.0)
MCV: 85.7 fL (ref 80.0–100.0)
Platelets: 321 10*3/uL (ref 150–400)
RBC: 3.35 MIL/uL — ABNORMAL LOW (ref 3.87–5.11)
RDW: 16.8 % — ABNORMAL HIGH (ref 11.5–15.5)
WBC: 9.1 10*3/uL (ref 4.0–10.5)
nRBC: 0 % (ref 0.0–0.2)

## 2023-06-05 LAB — GLUCOSE, CAPILLARY
Glucose-Capillary: 169 mg/dL — ABNORMAL HIGH (ref 70–99)
Glucose-Capillary: 175 mg/dL — ABNORMAL HIGH (ref 70–99)

## 2023-06-05 MED ORDER — HYDROCODONE-ACETAMINOPHEN 5-325 MG PO TABS
1.0000 | ORAL_TABLET | Freq: Four times a day (QID) | ORAL | 0 refills | Status: AC | PRN
  Filled 2023-06-05: qty 30, 4d supply, fill #0

## 2023-06-05 MED ORDER — K PHOS MONO-SOD PHOS DI & MONO 155-852-130 MG PO TABS
500.0000 mg | ORAL_TABLET | Freq: Three times a day (TID) | ORAL | 0 refills | Status: AC
  Filled 2023-06-05: qty 15, 3d supply, fill #0

## 2023-06-05 MED ORDER — FERROUS SULFATE 325 (65 FE) MG PO TABS
325.0000 mg | ORAL_TABLET | Freq: Every day | ORAL | 1 refills | Status: AC
  Filled 2023-06-05: qty 30, 30d supply, fill #0

## 2023-06-05 NOTE — Progress Notes (Signed)
Discharge medications delivered to room, given to patient by this RN. AVS updated in pen to show meds given today

## 2023-06-05 NOTE — Progress Notes (Signed)
Physical Therapy Treatment Patient Details Name: Jillian Montgomery MRN: [id number] DOB: [birth year] Today's Date: 06/05/2023   History of Present Illness Patient is a 67 year old female admitted with colocutaneous fistula, hyponatremia. S/P lap LAR 11/14. patient was noted to have post op anemia and received 1 unit PRBCs.  Hx of CKD, DM, pancreatitis, diverticulitis, fistula.    PT Comments  Pt agreeable to working with therapy. A bit drowsy-pain meds? Will continue to follow.     If plan is discharge home, recommend the following: Assistance with cooking/housework;Assist for transportation;Help with stairs or ramp for entrance   Can travel by private vehicle        Equipment Recommendations  None recommended by PT    Recommendations for Other Services       Precautions / Restrictions Precautions Precautions: Fall Precaution Comments: abdominal precautions Restrictions Weight Bearing Restrictions: No     Mobility  Bed Mobility               General bed mobility comments: oob in recliner    Transfers Overall transfer level: Modified independent                 General transfer comment: increased time.    Ambulation/Gait Ambulation/Gait assistance: Contact guard assist Gait Distance (Feet): 215 Feet Assistive device: None Gait Pattern/deviations: Step-through pattern, Decreased stride length       General Gait Details: slow gait speed. intermittent unsteadiness. no overt lob. pt denied dizziness.   Stairs             Wheelchair Mobility     Tilt Bed    Modified Rankin (Stroke Patients Only)       Balance Overall balance assessment: Mild deficits observed, not formally tested                                          Cognition Arousal: Suspect due to medications (drowsy) Behavior During Therapy: WFL for tasks assessed/performed Overall Cognitive Status: Within Functional Limits for tasks assessed                                  General Comments: alert, cooperative, appropriate. ?pain meds        Exercises      General Comments        Pertinent Vitals/Pain Pain Assessment Pain Assessment: Faces Faces Pain Scale: Hurts little more Pain Location: abdomen Pain Descriptors / Indicators: Sore, Operative site guarding Pain Intervention(s): Limited activity within patient's tolerance, Monitored during session, Repositioned    Home Living                          Prior Function            PT Goals (current goals can now be found in the care plan section) Progress towards PT goals: Progressing toward goals    Frequency    Min 1X/week      PT Plan      Co-evaluation              AM-PAC PT "6 Clicks" Mobility   Outcome Measure  Help needed turning from your back to your side while in a flat bed without using bedrails?: None Help needed moving from lying on your back to sitting on the  side of a flat bed without using bedrails?: A Little Help needed moving to and from a bed to a chair (including a wheelchair)?: None Help needed standing up from a chair using your arms (e.g., wheelchair or bedside chair)?: None Help needed to walk in hospital room?: A Little Help needed climbing 3-5 steps with a railing? : A Little 6 Click Score: 21    End of Session   Activity Tolerance: Patient tolerated treatment well Patient left: in chair;with call bell/phone within reach   PT Visit Diagnosis: Pain;Other abnormalities of gait and mobility (R26.89) Pain - part of body:  (abdomen)     Time: 1045-1101 PT Time Calculation (min) (ACUTE ONLY): 16 min  Charges:    $Gait Training: 8-22 mins PT General Charges $$ ACUTE PT VISIT: 1 Visit                        Faye Ramsay, PT Acute Rehabilitation  Office: [phone]

## 2023-06-05 NOTE — Progress Notes (Signed)
[facility] PROGRESS NOTE  Patient: Jillian Montgomery GHW:[id number]   PCP: Merri Brunette, MD DOB: 04-08-1956   DOA: 05/31/2023   DOS: 06/05/2023    Subjective: no acute complains.  Objective:  Vitals:   06/04/23 1318 06/04/23 2026 06/05/23 0621 06/05/23 1121  BP: (!) 121/53 (!) 124/55 129/61 123/60  Pulse: 77 79 89 81  Resp: 18 18 18 16   Temp: 98.5 F (36.9 C) 98.2 F (36.8 C) 98.2 F (36.8 C) 98.2 F (36.8 C)  TempSrc: Oral Oral Oral Oral  SpO2: 100% 99% 99% 100%  Weight:      Height:       AAOx3  Assessment and plan: Acute kidney injury on CKD 3A. HTN Baseline serum creatinine around 1.3 but recently prior to discharge serum creatinine was also 1.5.   On admission serum creatinine was 1.9 and currently improving.  Patient on losartan, atenolol and chlorthalidone. These meds have been on hold since 11/15 with improvement in creatinine and her bp has remained normal. For now would recommend to restart losarton but continue to hold atenolol-chlorthalidone combo.    Author: Lynden Oxford, MD [facility] 06/05/2023 12:05 PM   If 7PM-7AM, please contact night-coverage at [email or web address]

## 2023-06-05 NOTE — Progress Notes (Signed)
Patient demonstrated correct methods to change dressing. Gauze, saline, 4x4 and tape were sent home with patient. All questions were answered.

## 2023-06-05 NOTE — TOC Transition Note (Signed)
Transition of Care [facility]) - CM/SW Discharge Note  Patient Details  Name: NICKOLETTE MILNOR MRN: [id number] Date of Birth: 08-11-1955  [facility]) CM/SW Contact:  Ewing Schlein, LCSW Phone Number: 06/05/2023, 11:12 AM  Clinical Narrative: Patient will discharge home today. CSW notified Tresa Endo with [facility] regarding discharge. TOC signing off.  Final next level of care: Home w Home Health Services Barriers to Discharge: Barriers Resolved  Patient Goals and CMS Choice CMS Medicare.gov Compare Post Acute Care list provided to:: Patient Choice offered to / list presented to : Patient  Discharge Plan and Services Additional resources added to the After Visit Summary for   In-house Referral: Clinical Social Work Post Acute Care Choice: Home Health          DME Arranged: N/A DME Agency: NA HH Arranged: PT, RN HH Agency: [facility] Date HH Agency Contacted: 06/04/23 Time HH Agency Contacted: 1435 Representative spoke with at [facility] Agency: Tresa Endo  Social Determinants of Health (SDOH) Interventions SDOH Screenings   Food Insecurity: No Food Insecurity (05/31/2023)  Housing: Low Risk  (05/31/2023)  Transportation Needs: No Transportation Needs (05/31/2023)  Utilities: Not At Risk (05/31/2023)  Social Connections: Unknown (04/04/2023)   Received from [facility]  Tobacco Use: Low Risk  (05/31/2023)   Readmission Risk Interventions     No data to display

## 2023-06-05 NOTE — Progress Notes (Signed)
Discharge instructions discussed with patient, verbalized agreement and understanding 

## 2023-06-05 NOTE — Discharge Summary (Signed)
Physician Discharge Summary  Patient ID: Jillian Montgomery MRN: [id number] DOB/AGE: 01/03/1956 67 y.o.  Admit date: 05/31/2023 Discharge date: 06/05/2023  Admission Diagnoses: Colocutaneous fistula Discharge Diagnoses:  Principal Problem:   Colocutaneous fistula Active Problems:   CKD (chronic kidney disease) stage 3, GFR 30-59 ml/min (HCC)   Diverticulitis of large intestine with abscess   HTN (hypertension)   Type 2 diabetes mellitus treated with insulin (HCC)   Hyponatremia   IDA (iron deficiency anemia)   AKI (acute kidney injury) (HCC)   Protein-calorie malnutrition, moderate (HCC)   Discharged Condition: good  Hospital Course: Patient was admitted to the med surg floor after surgery.  Diet was advanced as tolerated.  Patient began to have bowel function on postop day 2.  By postop day 5, she was tolerating a solid diet and pain was controlled with oral medications.  She developed some hypotension and difficulty controlling blood glucoses with sliding scale insulin.  Medicine consult was obtained.  Medications were adjusted and 1 unit of blood was given for hemoglobin of 7.6.  She responded appropriately to this.  She was transition to an oral diet. She was urinating without difficulty and ambulating without assistance.  Patient was felt to be in stable condition for discharge to home.  She will continue packing her wound at home with the assistance of home health.   Consults:  hospitalist  Significant Diagnostic Studies: labs: cbc, bmet  Treatments: IV hydration, analgesia: Vicodin, insulin: regular, and surgery: Robotic LAR  Discharge Exam: Blood pressure 129/61, pulse 89, temperature 98.2 F (36.8 C), temperature source Oral, resp. rate 18, height 5\' 2"  (1.575 m), weight 63.9 kg, SpO2 99%. General appearance: alert and cooperative GI: soft, non-distended Incision/Wound: healing well, LLQ wound open and packed  Disposition: home   Allergies as of 06/05/2023        Reactions   Lisinopril Swelling   Pt stated, "lips swell up"   Metformin Diarrhea   Oxycodone Other (See Comments)   Mild confusion/sleepiness        Medication List     TAKE these medications    atenolol-chlorthalidone 50-25 MG tablet Commonly known as: TENORETIC Take 1 tablet by mouth daily.   BD Pen Needle Nano 2nd Gen 32G X 4 MM Misc Generic drug: Insulin Pen Needle AS DIRECTED INVITRO USE WITH LANTUS ONCE A DAY 90 DAYS   Dexcom G7 Receiver Devi check sugars at least 4-6 times a day for 30 days   ferrous sulfate 325 (65 FE) MG tablet Take 1 tablet (325 mg total) by mouth daily with breakfast.   HYDROcodone-acetaminophen 5-325 MG tablet Commonly known as: NORCO/VICODIN Take 1-2 tablets by mouth every 6 (six) hours as needed for moderate pain (pain score 4-6) or severe pain (pain score 7-10).   insulin detemir 100 UNIT/ML injection Commonly known as: Levemir Inject 0.1 mLs (10 Units total) into the skin at bedtime.   losartan 100 MG tablet Commonly known as: COZAAR Take 100 mg by mouth daily.   Normal Saline Flush 0.9 % Soln Flush once daily with 5 ml.   polyethylene glycol powder 17 GM/SCOOP powder Commonly known as: GLYCOLAX/MIRALAX Mix 1 capful (17 g total) in 8 oz clear liquid and drink by mouth once daily.   Evaristo Bury FlexTouch 100 UNIT/ML FlexTouch Pen Generic drug: insulin degludec Inject 20 Units into the skin at bedtime.        Follow-up Information     Romie Levee, MD. Schedule an appointment as soon as possible for  a visit in 2 week(s).   Specialties: General Surgery, Colon and Rectal Surgery Contact information: [address] [place] [address] [phone]         [facility] Follow up.   Specialty: Home Health Services Why: [facility] will provide PT and nursing for wound care in the home after discharge. Contact information: [address] [place] [address] [phone]                  Signed: Vanita Panda 06/05/2023, 10:00 AM

## 2023-06-05 NOTE — Progress Notes (Signed)
Pt states she would like pain medication prior to doing her dressing change  which RN will observe. Pt is going to be responsible for her own dressing changes at home.

## 2023-06-05 NOTE — Progress Notes (Signed)
Occupational Therapy Treatment Patient Details Name: Jillian Montgomery MRN: [id number] DOB: 08-Jul-1956 Today's Date: 06/05/2023   History of present illness Patient is a 67 year old female admitted with colocutaneous fistula, hyponatremia. S/P lap LAR 11/14. patient was noted to have post op anemia and received 1 unit PRBCs.  Hx of CKD, DM, pancreatitis, diverticulitis, fistula.   OT comments  Patient was noted to be more lethargic today but still motivated to participate in session. Patient noted to have taken pain medications just prior to session as well. Nurse made aware of changes in patients level of participation compared to session on 11/18. Patient was educated on LB Dressing with AE to increase independence. Patient will need increased caregiver support in the next level of care. Patient plans to d/c home with [facility] services. Patient's discharge plan remains appropriate at this time. OT will continue to follow acutely.        If plan is discharge home, recommend the following:  A little help with walking and/or transfers;A lot of help with bathing/dressing/bathroom;Assistance with cooking/housework;Assist for transportation;Direct supervision/assist for medications management;Direct supervision/assist for financial management;Help with stairs or ramp for entrance   Equipment Recommendations  None recommended by OT       Precautions / Restrictions Precautions Precautions: Fall Precaution Comments: abdominal precautions Restrictions Weight Bearing Restrictions: No       Mobility Bed Mobility Overal bed mobility: Needs Assistance     Sidelying to sit: HOB elevated, Used rails, Min assist       General bed mobility comments: with increased time and education on log rolling, min A needed to bring trunk upright sitting EOB         Balance Overall balance assessment: Mild deficits observed, not formally tested         ADL either performed or assessed with clinical  judgement   ADL Overall ADL's : Needs assistance/impaired       Lower Body Dressing Details (indicate cue type and reason): patient was educated on using AE for LB Dressing tasks. patient was educated on using reacher to doff socks and socka id to don socks while seated in recliner with patietn needing min verbal cues to carryover task after verbal and visual demonstration. patient reported wearing gowns at home and not wearing underwear for last few months with abdominal pain. patient was educated on how to don pants using simulated waist band with reacher. patient verbalized understanding.                      Cognition Arousal: Lethargic Behavior During Therapy: Flat affect Overall Cognitive Status: Within Functional Limits for tasks assessed         General Comments: patient was cooperative but appeared to have more lethargicness than yesterdays session. patietn noted to have taken pain medications closer to session time and that patietn did endorse feeling different but unable to put finger on it.                   Pertinent Vitals/ Pain       Pain Assessment Pain Assessment: Faces Faces Pain Scale: Hurts a little bit Pain Location: abdomen Pain Descriptors / Indicators: Operative site guarding         Frequency  Min 1X/week        Progress Toward Goals  OT Goals(current goals can now be found in the care plan section)  Progress towards OT goals: Progressing toward goals     Plan  AM-PAC OT "6 Clicks" Daily Activity     Outcome Measure   Help from another person eating meals?: A Little Help from another person taking care of personal grooming?: A Little Help from another person toileting, which includes using toliet, bedpan, or urinal?: A Lot Help from another person bathing (including washing, rinsing, drying)?: A Lot Help from another person to put on and taking off regular upper body clothing?: A Little Help from another person to put  on and taking off regular lower body clothing?: A Lot 6 Click Score: 15    End of Session Equipment Utilized During Treatment: Gait belt  OT Visit Diagnosis: Unsteadiness on feet (R26.81);Other abnormalities of gait and mobility (R26.89);Muscle weakness (generalized) (M62.81);Pain   Activity Tolerance Patient tolerated treatment well   Patient Left in chair;with call bell/phone within reach   Nurse Communication Mobility status;Other (comment) (patients more lethargic participation today)        Time: 4098-1191 OT Time Calculation (min): 26 min  Charges: OT General Charges $OT Visit: 1 Visit OT Treatments $Self Care/Home Management : 23-37 mins  Rosalio Loud, MS Acute Rehabilitation Department Office# [phone]   Selinda Flavin 06/05/2023, 10:49 AM

## 2023-06-06 DIAGNOSIS — E440 Moderate protein-calorie malnutrition: Secondary | ICD-10-CM | POA: Diagnosis not present

## 2023-06-06 DIAGNOSIS — Z48815 Encounter for surgical aftercare following surgery on the digestive system: Secondary | ICD-10-CM | POA: Diagnosis not present

## 2023-06-06 DIAGNOSIS — H409 Unspecified glaucoma: Secondary | ICD-10-CM | POA: Diagnosis not present

## 2023-06-06 DIAGNOSIS — I129 Hypertensive chronic kidney disease with stage 1 through stage 4 chronic kidney disease, or unspecified chronic kidney disease: Secondary | ICD-10-CM | POA: Diagnosis not present

## 2023-06-06 DIAGNOSIS — Z4803 Encounter for change or removal of drains: Secondary | ICD-10-CM | POA: Diagnosis not present

## 2023-06-06 DIAGNOSIS — E871 Hypo-osmolality and hyponatremia: Secondary | ICD-10-CM | POA: Diagnosis not present

## 2023-06-06 DIAGNOSIS — E1122 Type 2 diabetes mellitus with diabetic chronic kidney disease: Secondary | ICD-10-CM | POA: Diagnosis not present

## 2023-06-06 DIAGNOSIS — N183 Chronic kidney disease, stage 3 unspecified: Secondary | ICD-10-CM | POA: Diagnosis not present

## 2023-06-06 DIAGNOSIS — D509 Iron deficiency anemia, unspecified: Secondary | ICD-10-CM | POA: Diagnosis not present

## 2023-06-07 DIAGNOSIS — Z4803 Encounter for change or removal of drains: Secondary | ICD-10-CM | POA: Diagnosis not present

## 2023-06-07 DIAGNOSIS — N183 Chronic kidney disease, stage 3 unspecified: Secondary | ICD-10-CM | POA: Diagnosis not present

## 2023-06-07 DIAGNOSIS — D509 Iron deficiency anemia, unspecified: Secondary | ICD-10-CM | POA: Diagnosis not present

## 2023-06-07 DIAGNOSIS — H409 Unspecified glaucoma: Secondary | ICD-10-CM | POA: Diagnosis not present

## 2023-06-07 DIAGNOSIS — E440 Moderate protein-calorie malnutrition: Secondary | ICD-10-CM | POA: Diagnosis not present

## 2023-06-07 DIAGNOSIS — I129 Hypertensive chronic kidney disease with stage 1 through stage 4 chronic kidney disease, or unspecified chronic kidney disease: Secondary | ICD-10-CM | POA: Diagnosis not present

## 2023-06-07 DIAGNOSIS — E1122 Type 2 diabetes mellitus with diabetic chronic kidney disease: Secondary | ICD-10-CM | POA: Diagnosis not present

## 2023-06-07 DIAGNOSIS — E871 Hypo-osmolality and hyponatremia: Secondary | ICD-10-CM | POA: Diagnosis not present

## 2023-06-07 DIAGNOSIS — Z48815 Encounter for surgical aftercare following surgery on the digestive system: Secondary | ICD-10-CM | POA: Diagnosis not present

## 2023-06-19 DIAGNOSIS — Z4803 Encounter for change or removal of drains: Secondary | ICD-10-CM | POA: Diagnosis not present

## 2023-06-19 DIAGNOSIS — Z48815 Encounter for surgical aftercare following surgery on the digestive system: Secondary | ICD-10-CM | POA: Diagnosis not present

## 2023-06-19 DIAGNOSIS — I129 Hypertensive chronic kidney disease with stage 1 through stage 4 chronic kidney disease, or unspecified chronic kidney disease: Secondary | ICD-10-CM | POA: Diagnosis not present

## 2023-06-19 DIAGNOSIS — E871 Hypo-osmolality and hyponatremia: Secondary | ICD-10-CM | POA: Diagnosis not present

## 2023-06-19 DIAGNOSIS — H409 Unspecified glaucoma: Secondary | ICD-10-CM | POA: Diagnosis not present

## 2023-06-19 DIAGNOSIS — E1122 Type 2 diabetes mellitus with diabetic chronic kidney disease: Secondary | ICD-10-CM | POA: Diagnosis not present

## 2023-06-19 DIAGNOSIS — N183 Chronic kidney disease, stage 3 unspecified: Secondary | ICD-10-CM | POA: Diagnosis not present

## 2023-06-19 DIAGNOSIS — E440 Moderate protein-calorie malnutrition: Secondary | ICD-10-CM | POA: Diagnosis not present

## 2023-06-19 DIAGNOSIS — D509 Iron deficiency anemia, unspecified: Secondary | ICD-10-CM | POA: Diagnosis not present

## 2023-07-16 ENCOUNTER — Other Ambulatory Visit: Payer: Self-pay | Admitting: Family Medicine

## 2023-07-16 DIAGNOSIS — E2839 Other primary ovarian failure: Secondary | ICD-10-CM

## 2023-07-16 DIAGNOSIS — Z1331 Encounter for screening for depression: Secondary | ICD-10-CM | POA: Diagnosis not present

## 2023-07-16 DIAGNOSIS — Z1159 Encounter for screening for other viral diseases: Secondary | ICD-10-CM | POA: Diagnosis not present

## 2023-07-16 DIAGNOSIS — I10 Essential (primary) hypertension: Secondary | ICD-10-CM | POA: Diagnosis not present

## 2023-07-16 DIAGNOSIS — D649 Anemia, unspecified: Secondary | ICD-10-CM | POA: Diagnosis not present

## 2023-07-16 DIAGNOSIS — Z1231 Encounter for screening mammogram for malignant neoplasm of breast: Secondary | ICD-10-CM

## 2023-07-16 DIAGNOSIS — K5790 Diverticulosis of intestine, part unspecified, without perforation or abscess without bleeding: Secondary | ICD-10-CM | POA: Diagnosis not present

## 2023-07-16 DIAGNOSIS — E785 Hyperlipidemia, unspecified: Secondary | ICD-10-CM | POA: Diagnosis not present

## 2023-07-16 DIAGNOSIS — E1165 Type 2 diabetes mellitus with hyperglycemia: Secondary | ICD-10-CM | POA: Diagnosis not present

## 2023-07-16 DIAGNOSIS — R809 Proteinuria, unspecified: Secondary | ICD-10-CM | POA: Diagnosis not present

## 2023-07-16 DIAGNOSIS — Z0000 Encounter for general adult medical examination without abnormal findings: Secondary | ICD-10-CM | POA: Diagnosis not present

## 2023-07-16 DIAGNOSIS — N1831 Chronic kidney disease, stage 3a: Secondary | ICD-10-CM | POA: Diagnosis not present

## 2023-08-02 ENCOUNTER — Ambulatory Visit: Payer: Medicare HMO

## 2023-09-26 DIAGNOSIS — M47892 Other spondylosis, cervical region: Secondary | ICD-10-CM | POA: Diagnosis not present

## 2023-10-02 DIAGNOSIS — M47892 Other spondylosis, cervical region: Secondary | ICD-10-CM | POA: Diagnosis not present

## 2023-10-04 DIAGNOSIS — M47892 Other spondylosis, cervical region: Secondary | ICD-10-CM | POA: Diagnosis not present

## 2023-10-09 DIAGNOSIS — M47892 Other spondylosis, cervical region: Secondary | ICD-10-CM | POA: Diagnosis not present

## 2023-10-11 DIAGNOSIS — M47892 Other spondylosis, cervical region: Secondary | ICD-10-CM | POA: Diagnosis not present

## 2024-01-01 ENCOUNTER — Other Ambulatory Visit: Payer: Medicare HMO

## 2024-01-24 DIAGNOSIS — E785 Hyperlipidemia, unspecified: Secondary | ICD-10-CM | POA: Diagnosis not present

## 2024-01-24 DIAGNOSIS — E1165 Type 2 diabetes mellitus with hyperglycemia: Secondary | ICD-10-CM | POA: Diagnosis not present

## 2024-01-24 DIAGNOSIS — N1832 Chronic kidney disease, stage 3b: Secondary | ICD-10-CM | POA: Diagnosis not present

## 2024-01-24 DIAGNOSIS — S161XXA Strain of muscle, fascia and tendon at neck level, initial encounter: Secondary | ICD-10-CM | POA: Diagnosis not present

## 2024-01-24 DIAGNOSIS — I10 Essential (primary) hypertension: Secondary | ICD-10-CM | POA: Diagnosis not present

## 2024-03-07 ENCOUNTER — Other Ambulatory Visit: Payer: Medicare HMO

## 2024-05-05 ENCOUNTER — Other Ambulatory Visit (HOSPITAL_BASED_OUTPATIENT_CLINIC_OR_DEPARTMENT_OTHER)

## 2024-05-05 ENCOUNTER — Encounter (HOSPITAL_BASED_OUTPATIENT_CLINIC_OR_DEPARTMENT_OTHER): Payer: Self-pay

## 2024-07-31 NOTE — Progress Notes (Signed)
 Jillian Montgomery                                          MRN: [id number]   07/31/2024   The [facility] Quality Team Specialist reviewed this patient medical record for the purposes of chart review for care gap closure. The following were reviewed: chart review for care gap closure-glycemic status assessment.    [facility] Quality Team
